# Patient Record
Sex: Female | Born: 1955 | Race: Black or African American | Hispanic: No | Smoking: Never smoker
Health system: Southern US, Community
[De-identification: ages and names within clinical notes are randomized; demographics above are authoritative.]

## PROBLEM LIST (undated history)

## (undated) DIAGNOSIS — I471 Supraventricular tachycardia, unspecified: Secondary | ICD-10-CM

## (undated) DIAGNOSIS — B009 Herpesviral infection, unspecified: Secondary | ICD-10-CM

## (undated) DIAGNOSIS — G7000 Myasthenia gravis without (acute) exacerbation: Secondary | ICD-10-CM

## (undated) DIAGNOSIS — I10 Essential (primary) hypertension: Secondary | ICD-10-CM

## (undated) DIAGNOSIS — E119 Type 2 diabetes mellitus without complications: Secondary | ICD-10-CM

## (undated) HISTORY — PX: UTERINE FIBROID SURGERY: SHX826

## (undated) HISTORY — DX: Essential (primary) hypertension: I10

## (undated) HISTORY — DX: Myasthenia gravis without (acute) exacerbation: G70.00

## (undated) HISTORY — PX: MYOMECTOMY: SHX85

## (undated) HISTORY — DX: Supraventricular tachycardia, unspecified: I47.10

## (undated) HISTORY — DX: Type 2 diabetes mellitus without complications: E11.9

## (undated) HISTORY — DX: Supraventricular tachycardia: I47.1

## (undated) HISTORY — DX: Herpesviral infection, unspecified: B00.9

---

## 2004-06-28 ENCOUNTER — Ambulatory Visit: Payer: Self-pay | Admitting: Internal Medicine

## 2004-07-03 ENCOUNTER — Ambulatory Visit: Payer: Self-pay | Admitting: General Practice

## 2005-05-26 ENCOUNTER — Ambulatory Visit: Payer: Self-pay | Admitting: Internal Medicine

## 2005-08-05 ENCOUNTER — Ambulatory Visit: Payer: Self-pay | Admitting: General Practice

## 2006-02-10 DIAGNOSIS — E119 Type 2 diabetes mellitus without complications: Secondary | ICD-10-CM

## 2006-02-10 HISTORY — DX: Type 2 diabetes mellitus without complications: E11.9

## 2006-09-01 ENCOUNTER — Ambulatory Visit: Payer: Self-pay | Admitting: Endocrinology

## 2006-12-17 ENCOUNTER — Ambulatory Visit: Payer: Self-pay | Admitting: Unknown Physician Specialty

## 2007-09-16 ENCOUNTER — Ambulatory Visit: Payer: Self-pay | Admitting: Internal Medicine

## 2008-01-17 ENCOUNTER — Emergency Department: Payer: Self-pay | Admitting: Emergency Medicine

## 2009-03-29 ENCOUNTER — Ambulatory Visit: Payer: Self-pay | Admitting: Internal Medicine

## 2011-12-11 ENCOUNTER — Ambulatory Visit: Payer: Self-pay | Admitting: Internal Medicine

## 2012-01-06 ENCOUNTER — Ambulatory Visit: Payer: Self-pay | Admitting: Internal Medicine

## 2012-02-24 ENCOUNTER — Ambulatory Visit: Payer: Self-pay | Admitting: Internal Medicine

## 2012-03-23 ENCOUNTER — Other Ambulatory Visit (HOSPITAL_COMMUNITY)
Admission: RE | Admit: 2012-03-23 | Discharge: 2012-03-23 | Disposition: A | Discharge status: Home / Self Care | Payer: BC Managed Care – PPO | Source: Ambulatory Visit | Attending: Internal Medicine | Admitting: Internal Medicine

## 2012-03-23 ENCOUNTER — Encounter: Payer: Self-pay | Admitting: Internal Medicine

## 2012-03-23 ENCOUNTER — Ambulatory Visit (INDEPENDENT_AMBULATORY_CARE_PROVIDER_SITE_OTHER): Payer: BC Managed Care – PPO | Admitting: Internal Medicine

## 2012-03-23 VITALS — BP 138/74 | HR 88 | Temp 98.4°F | Ht 68.17 in | Wt 277.5 lb

## 2012-03-23 DIAGNOSIS — Z1151 Encounter for screening for human papillomavirus (HPV): Secondary | ICD-10-CM | POA: Insufficient documentation

## 2012-03-23 DIAGNOSIS — Z124 Encounter for screening for malignant neoplasm of cervix: Secondary | ICD-10-CM

## 2012-03-23 DIAGNOSIS — I498 Other specified cardiac arrhythmias: Secondary | ICD-10-CM

## 2012-03-23 DIAGNOSIS — R000 Tachycardia, unspecified: Secondary | ICD-10-CM

## 2012-03-23 DIAGNOSIS — I10 Essential (primary) hypertension: Secondary | ICD-10-CM

## 2012-03-23 DIAGNOSIS — G7000 Myasthenia gravis without (acute) exacerbation: Secondary | ICD-10-CM

## 2012-03-23 DIAGNOSIS — Z01419 Encounter for gynecological examination (general) (routine) without abnormal findings: Secondary | ICD-10-CM | POA: Insufficient documentation

## 2012-03-23 DIAGNOSIS — I471 Supraventricular tachycardia: Secondary | ICD-10-CM

## 2012-03-23 DIAGNOSIS — E119 Type 2 diabetes mellitus without complications: Secondary | ICD-10-CM

## 2012-03-23 MED ORDER — METOPROLOL TARTRATE 25 MG PO TABS: 25.0000 mg | ORAL_TABLET | Freq: Two times a day (BID) | ORAL | Status: DC

## 2012-03-25 ENCOUNTER — Encounter: Payer: Self-pay | Admitting: Internal Medicine

## 2012-03-25 DIAGNOSIS — G7000 Myasthenia gravis without (acute) exacerbation: Secondary | ICD-10-CM | POA: Insufficient documentation

## 2012-03-25 DIAGNOSIS — I471 Supraventricular tachycardia: Secondary | ICD-10-CM | POA: Insufficient documentation

## 2012-03-25 DIAGNOSIS — I10 Essential (primary) hypertension: Secondary | ICD-10-CM | POA: Insufficient documentation

## 2012-03-25 DIAGNOSIS — E1165 Type 2 diabetes mellitus with hyperglycemia: Secondary | ICD-10-CM | POA: Insufficient documentation

## 2012-03-25 MED ORDER — ACYCLOVIR 200 MG PO CAPS: ORAL_CAPSULE | ORAL | Status: DC

## 2012-03-25 NOTE — Assessment & Plan Note (Signed)
Sugars as outlined.  Discussed need for diet adjustment and weight loss.  Check metabolic panel and a1c.

## 2012-03-25 NOTE — Assessment & Plan Note (Signed)
Currently stable.  Follow.   

## 2012-03-25 NOTE — Progress Notes (Signed)
Subjective:    Patient ID: Amber Decker, female    DOB: 04-Mar-1955, 57 y.o.   MRN: [id number]  HPI 57 year old female with past history of hypertension, diabetes, myasthenia gravis and SVT.  She comes in today to follow up on these issues as well for a complete physical exam.  She is off her metoprolol.  States she could not afford the medication.  She is off caffeine.  When coming off, noticed she had headaches and dizziness.  Her arms and legs ached.  The headache has improved.  Leg and arm aching has improved.  Has been having increased heart rate.  Intermittent.  Worse at night.  No chest pain.  Breathing stable.  Eating and drinking well.  Trying to watch what she eats.  Brought in no recorded sugar readings.  Am sugars averaging 120-130s and pm sugars 180-190.     Past Medical History  Diagnosis Date  . Hypertension   . Diabetes mellitus without complication   . Myasthenia gravis   . Herpes   . SVT (supraventricular tachycardia)     Outpatient Encounter Prescriptions as of 03/23/2012  Medication Sig Dispense Refill  . acyclovir (ZOVIRAX) 200 MG capsule Take by mouth 2 (two) times daily.      . Biotin 1000 MCG tablet Take 1,000 mcg by mouth daily.      . cholecalciferol (VITAMIN D) 1000 UNITS tablet Take 1,000 Units by mouth daily.      [name] lisinopril-hydrochlorothiazide (PRINZIDE,ZESTORETIC) 10-12.5 MG per tablet Take 1 tablet by mouth daily.      . metFORMIN (GLUCOPHAGE) 1000 MG tablet Take 1,000 mg by mouth 2 (two) times daily with a meal.      . Multiple Vitamin (MULTIVITAMIN) tablet Take 1 tablet by mouth daily.      . Multiple Vitamins-Minerals (ALIVE WOMENS 50+ PO) Take by mouth daily.      . ranitidine (ZANTAC) 150 MG tablet Take 150 mg by mouth as needed for heartburn.      . metoprolol tartrate (LOPRESSOR) 25 MG tablet Take 1 tablet (25 mg total) by mouth 2 (two) times daily.  60 tablet  2  . [DISCONTINUED] metoprolol succinate (TOPROL-XL) 25 MG 24 hr tablet Take 25 mg by  mouth 2 (two) times daily.       No facility-administered encounter medications on file as of 03/23/2012.    Review of Systems Previous headache.  This occurred when she stopped caffeine.  Better now.  No significant sinus or allergy symptoms.   Does report the increased heart rated as outlined.  Notices more at night.  Does occur during the day.  Appears to be occurring more frequently.  No increased shortness of breath, cough or congestion.  No nausea or vomiting.  No acid reflux.  No abdominal pain or cramping.  No bowel change, such as diarrhea, constipation, BRBPR or melana.  No urine change.  Sugars as outlined.       Objective:   Physical Exam Filed Vitals:   03/23/12 1140  BP: 138/74  Pulse: 88  Temp: 98.4 F (36.9 C)   Blood pressure recheck:  25/50  57 year old female in no acute distress.   HEENT:  Nares- clear.  Oropharynx - without lesions. NECK:  Supple.  Nontender.  No audible bruit.  HEART:  Appears to be regular. LUNGS:  No crackles or wheezing audible.  Respirations even and unlabored.  RADIAL PULSE:  Equal bilaterally.    BREASTS:  No nipple discharge or  nipple retraction present.  Could not appreciate any distinct nodules or axillary adenopathy.  ABDOMEN:  Soft, nontender.  Bowel sounds present and normal.  No audible abdominal bruit.  GU:  Normal external genitalia.  Vaginal vault without lesions.  Cervix identified.  Pap performed. Could not appreciate any adnexal masses or tenderness.   RECTAL:  Heme negative.   EXTREMITIES:  No increased edema present.  DP pulses palpable and equal bilaterally.           Assessment & Plan:  PULMONARY.  Breathing stable.  Follow.   HEALTH MAINTENANCE.  Physical today.  Pap today.  Schedule mammogram.  Colonoscopy 12/17/06 - internal hemorrhoids.  Recommended follow up colonoscopy 10 years.

## 2012-03-25 NOTE — Assessment & Plan Note (Signed)
Blood pressure as outlined.  Add back toprol for rate control.  Follow pressures.  Check metabolic panel.

## 2012-03-25 NOTE — Assessment & Plan Note (Signed)
Previous stress test revealed no ischemia.  She did have SVT.  Saw Dr Gwen Pounds.  He placed her on Toprol.  She is off toprol now.  See above.  Now with increasing heart rated and symptoms.  Unclear if all related to SVT.  Will restart her toprol.  Will use the short acting generic toprol - for cost savings.  EKG revealed SR with frequent PVCs.  Nonspecific ST/T changes.  I do feel she warrants further cardiac w/up.  Will refer back to cardiology for further evaluation and w/up.  Pt comfortable with this plan.  Explained if symptoms changed or worsened - she was to be reevaluated immediately.

## 2012-03-29 ENCOUNTER — Encounter: Payer: Self-pay | Admitting: *Deleted

## 2012-04-03 ENCOUNTER — Telehealth: Payer: Self-pay | Admitting: Internal Medicine

## 2012-04-03 MED ORDER — ACYCLOVIR 400 MG PO TABS: 400.0000 mg | ORAL_TABLET | Freq: Every day | ORAL | Status: DC

## 2012-04-03 NOTE — Telephone Encounter (Signed)
Order placed for acyclovir.

## 2012-04-07 ENCOUNTER — Telehealth: Payer: Self-pay | Admitting: Internal Medicine

## 2012-04-07 NOTE — Telephone Encounter (Signed)
[facility] calling regarding refill on acyclovir 400 mg #90.  [phone].

## 2012-04-09 NOTE — Telephone Encounter (Signed)
Called [place] [facility] back and changed quantity of to 90 w/1 refill.

## 2012-04-15 ENCOUNTER — Ambulatory Visit: Payer: Self-pay | Admitting: Internal Medicine

## 2012-04-23 ENCOUNTER — Encounter: Payer: Self-pay | Admitting: Internal Medicine

## 2012-05-06 ENCOUNTER — Ambulatory Visit (INDEPENDENT_AMBULATORY_CARE_PROVIDER_SITE_OTHER): Payer: BC Managed Care – PPO | Admitting: Internal Medicine

## 2012-05-06 ENCOUNTER — Encounter: Payer: Self-pay | Admitting: Internal Medicine

## 2012-05-06 VITALS — BP 120/80 | HR 108 | Temp 98.4°F | Ht 68.17 in | Wt 283.0 lb

## 2012-05-06 DIAGNOSIS — E119 Type 2 diabetes mellitus without complications: Secondary | ICD-10-CM

## 2012-05-06 DIAGNOSIS — I10 Essential (primary) hypertension: Secondary | ICD-10-CM

## 2012-05-06 DIAGNOSIS — G7000 Myasthenia gravis without (acute) exacerbation: Secondary | ICD-10-CM

## 2012-05-06 DIAGNOSIS — I471 Supraventricular tachycardia: Secondary | ICD-10-CM

## 2012-05-06 DIAGNOSIS — I498 Other specified cardiac arrhythmias: Secondary | ICD-10-CM

## 2012-05-06 MED ORDER — ACYCLOVIR 400 MG PO TABS: ORAL_TABLET | ORAL | Status: DC

## 2012-05-09 ENCOUNTER — Encounter: Payer: Self-pay | Admitting: Internal Medicine

## 2012-05-09 NOTE — Assessment & Plan Note (Signed)
Sugars as outlined.  Discussed need for diet adjustment and weight loss.  Check metabolic panel and a1c.  She plans to get her labs through a study and will forward her lab results to me.

## 2012-05-09 NOTE — Assessment & Plan Note (Signed)
Currently stable.  Follow.   

## 2012-05-09 NOTE — Assessment & Plan Note (Signed)
Blood pressure as outlined.  Back on metoprolol.  Follow.

## 2012-05-09 NOTE — Assessment & Plan Note (Signed)
Previous stress test revealed no ischemia.  She did have SVT.  Saw Dr Gwen Pounds.  He placed her on Toprol.  She was off last visit and symptoms were worse.  She is back on metoprolol now.  Symptoms improved.  Feels better. Seeing Dr Gwen Pounds.  Continue follow up with cardiology.

## 2012-05-09 NOTE — Progress Notes (Signed)
Subjective:    Patient ID: Amber Decker, female    DOB: 02-21-1955, 57 y.o.   MRN: [id number]  HPI 57 year old female with past history of hypertension, diabetes, myasthenia gravis and SVT.  She comes in today for a scheduled follow up.  She is back on metoprolol.  Heart racing and palpitaitons improved.  Feels better.  No chest pain.  Breathing stable.  Eating and drinking well.  Trying to watch what she eats.  Brought in no recorded sugar readings.  Am sugars averaging 106-119 and pm sugars 154-160.  Overall feels better.  Seeing Dr Gwen Pounds.  Planning to call for follow up.      Past Medical History  Diagnosis Date  . Hypertension   . Diabetes mellitus without complication   . Myasthenia gravis   . Herpes   . SVT (supraventricular tachycardia)     Outpatient Encounter Prescriptions as of 05/06/2012  Medication Sig Dispense Refill  . acyclovir (ZOVIRAX) 400 MG tablet One tablet q day to bid  180 tablet  1  . Biotin 1000 MCG tablet Take 1,000 mcg by mouth daily.      . cholecalciferol (VITAMIN D) 1000 UNITS tablet Take 1,000 Units by mouth daily.      [name] lisinopril-hydrochlorothiazide (PRINZIDE,ZESTORETIC) 10-12.5 MG per tablet Take 1 tablet by mouth daily.      . metFORMIN (GLUCOPHAGE) 1000 MG tablet Take 1,000 mg by mouth 2 (two) times daily with a meal.      . metoprolol tartrate (LOPRESSOR) 25 MG tablet Take 1 tablet (25 mg total) by mouth 2 (two) times daily.  60 tablet  2  . Multiple Vitamins-Minerals (ALIVE WOMENS 50+ PO) Take by mouth daily.      . Naphazoline-Polyethyl Glycol (EYE DROPS) 0.012-0.2 % SOLN Apply to eye.      . ranitidine (ZANTAC) 150 MG tablet Take 150 mg by mouth as needed for heartburn.      . [DISCONTINUED] acyclovir (ZOVIRAX) 400 MG tablet Take 1 tablet (400 mg total) by mouth daily.  90 tablet  1  . [DISCONTINUED] Multiple Vitamin (MULTIVITAMIN) tablet Take 1 tablet by mouth daily.       No facility-administered encounter medications on file as of  05/06/2012.    Review of Systems Patient denies any headache, lightheadedness or dizziness.  No sinus or allergy symptoms.  No chest pain, tightness or palpitations.  No increased shortness of breath, cough or congestion. No acid reflux.  Feels better on the metoprolol.  No nausea or vomiting.  No abdominal pain or cramping.  No bowel change, such as diarrhea, constipation, BRBPR or melana.  No urine change.  Seeing Dr Gwen Pounds.        Objective:   Physical Exam  Filed Vitals:   05/06/12 1153  BP: 120/80  Pulse: 108  Temp: 98.4 F (36.9 C)   Blood pressure recheck:  16/3  57 year old female in no acute distress.   HEENT:  Nares- clear.  Oropharynx - without lesions. NECK:  Supple.  Nontender.  No audible bruit.  HEART:  Appears to be regular with premature beats.   LUNGS:  No crackles or wheezing audible.  Respirations even and unlabored.  RADIAL PULSE:  Equal bilaterally.    ABDOMEN:  Soft, nontender.  Bowel sounds present and normal.  No audible abdominal bruit.    EXTREMITIES:  No increased edema present.          Assessment & Plan:  PULMONARY.  Breathing stable.  Follow.  HEALTH MAINTENANCE.  Physical last visit.  Scheduled for mammogram.  Colonoscopy 12/17/06 - internal hemorrhoids.  Recommended follow up colonoscopy 10 years.

## 2012-05-19 ENCOUNTER — Encounter: Payer: Self-pay | Admitting: Internal Medicine

## 2012-08-06 ENCOUNTER — Encounter: Payer: Self-pay | Admitting: Internal Medicine

## 2012-08-06 ENCOUNTER — Ambulatory Visit (INDEPENDENT_AMBULATORY_CARE_PROVIDER_SITE_OTHER): Payer: BC Managed Care – PPO | Admitting: Internal Medicine

## 2012-08-06 VITALS — BP 110/70 | HR 112 | Temp 98.3°F | Ht 68.17 in | Wt 288.2 lb

## 2012-08-06 DIAGNOSIS — G7000 Myasthenia gravis without (acute) exacerbation: Secondary | ICD-10-CM

## 2012-08-06 DIAGNOSIS — I471 Supraventricular tachycardia: Secondary | ICD-10-CM

## 2012-08-06 DIAGNOSIS — R000 Tachycardia, unspecified: Secondary | ICD-10-CM

## 2012-08-06 DIAGNOSIS — I10 Essential (primary) hypertension: Secondary | ICD-10-CM

## 2012-08-06 DIAGNOSIS — E119 Type 2 diabetes mellitus without complications: Secondary | ICD-10-CM

## 2012-08-06 DIAGNOSIS — I498 Other specified cardiac arrhythmias: Secondary | ICD-10-CM

## 2012-08-06 LAB — LIPID PANEL
Cholesterol: 193 mg/dL (ref 0–200)
HDL: 81.8 mg/dL (ref >39.00)
LDL Cholesterol: 101 mg/dL — ABNORMAL HIGH (ref 0–99)
Total CHOL/HDL Ratio: 2
Triglycerides: 52 mg/dL (ref 0.0–149.0)
VLDL: 10.4 mg/dL (ref 0.0–40.0)

## 2012-08-06 LAB — CBC WITH DIFFERENTIAL/PLATELET
Basophils Absolute: 0 10*3/uL (ref 0.0–0.1)
Basophils Relative: 0.5 % (ref 0.0–3.0)
Eosinophils Absolute: 0 10*3/uL (ref 0.0–0.7)
Eosinophils Relative: 0.7 % (ref 0.0–5.0)
HCT: 39.9 % (ref 36.0–46.0)
Hemoglobin: 13.3 g/dL (ref 12.0–15.0)
Lymphocytes Relative: 46.2 % — ABNORMAL HIGH (ref 12.0–46.0)
Lymphs Abs: 2.1 10*3/uL (ref 0.7–4.0)
MCHC: 33.3 g/dL (ref 30.0–36.0)
MCV: 91.5 fl (ref 78.0–100.0)
Monocytes Absolute: 0.4 10*3/uL (ref 0.1–1.0)
Monocytes Relative: 8.1 % (ref 3.0–12.0)
Neutro Abs: 2 10*3/uL (ref 1.4–7.7)
Neutrophils Relative %: 44.5 % (ref 43.0–77.0)
Platelets: 164 10*3/uL (ref 150.0–400.0)
RBC: 4.36 Mil/uL (ref 3.87–5.11)
RDW: 14.4 % (ref 11.5–14.6)
WBC: 4.6 10*3/uL (ref 4.5–10.5)

## 2012-08-06 LAB — COMPREHENSIVE METABOLIC PANEL
ALT: 19 U/L (ref 0–35)
AST: 21 U/L (ref 0–37)
Albumin: 4 g/dL (ref 3.5–5.2)
Alkaline Phosphatase: 44 U/L (ref 39–117)
BUN: 19 mg/dL (ref 6–23)
CO2: 27 mEq/L (ref 19–32)
Calcium: 9.5 mg/dL (ref 8.4–10.5)
Chloride: 106 mEq/L (ref 96–112)
Creatinine, Ser: 0.8 mg/dL (ref 0.4–1.2)
GFR: 95.4 mL/min (ref >60.00)
Glucose, Bld: 94 mg/dL (ref 70–99)
Potassium: 4.2 mEq/L (ref 3.5–5.1)
Sodium: 137 mEq/L (ref 135–145)
Total Bilirubin: 0.3 mg/dL (ref 0.3–1.2)
Total Protein: 7.4 g/dL (ref 6.0–8.3)

## 2012-08-06 LAB — HEMOGLOBIN A1C: Hgb A1c MFr Bld: 6.5 % (ref 4.6–6.5)

## 2012-08-06 LAB — TSH: TSH: 1.37 u[IU]/mL (ref 0.35–5.50)

## 2012-08-08 ENCOUNTER — Encounter: Payer: Self-pay | Admitting: Internal Medicine

## 2012-08-08 NOTE — Assessment & Plan Note (Signed)
Blood pressure as outlined.  Back on metoprolol.  Follow.

## 2012-08-08 NOTE — Assessment & Plan Note (Signed)
Previous stress test revealed no ischemia.  She did have SVT.  Saw Dr Gwen Pounds.  She is back on metoprolol now.  Symptoms improved.  Feels better. Seeing Dr Gwen Pounds.  Continue follow up with cardiology.

## 2012-08-08 NOTE — Assessment & Plan Note (Signed)
Sugars as outlined.  Discussed need for diet adjustment and weight loss.  Check metabolic panel and a1c.  Refer back to [facility] for two hour refresher class.

## 2012-08-08 NOTE — Progress Notes (Signed)
Subjective:    Patient ID: Amber Decker, female    DOB: 1955-05-13, 57 y.o.   MRN: [id number]  HPI 57 year old female with past history of hypertension, diabetes, myasthenia gravis and SVT.  She comes in today for a scheduled follow up.  She is back on metoprolol.  Heart racing and palpitaitons improved.  Feels better.  No chest pain.  Breathing stable.  Eating and drinking well.  Trying to watch what she eats.  Brought in no recorded sugar readings.  States am sugars averaging 95-126.  Overall feels better.  Seeing Dr Gwen Pounds.  Desires to go to a two hour refresher to help her with her diet adjustment.  Discussed the need for weight loss.      Past Medical History  Diagnosis Date  . Hypertension   . Diabetes mellitus without complication   . Myasthenia gravis   . Herpes   . SVT (supraventricular tachycardia)     Outpatient Encounter Prescriptions as of 08/06/2012  Medication Sig Dispense Refill  . acyclovir (ZOVIRAX) 400 MG tablet One tablet q day to bid  180 tablet  1  . Biotin 1000 MCG tablet Take 1,000 mcg by mouth daily.      . cholecalciferol (VITAMIN D) 1000 UNITS tablet Take 1,000 Units by mouth daily.      [name] lisinopril-hydrochlorothiazide (PRINZIDE,ZESTORETIC) 10-12.5 MG per tablet Take 1 tablet by mouth daily.      . metFORMIN (GLUCOPHAGE) 1000 MG tablet Take 1,000 mg by mouth 2 (two) times daily with a meal.      . metoprolol tartrate (LOPRESSOR) 25 MG tablet Take 1 tablet (25 mg total) by mouth 2 (two) times daily.  60 tablet  2  . Multiple Vitamins-Minerals (ALIVE WOMENS 50+ PO) Take by mouth daily.      . Naphazoline-Polyethyl Glycol (EYE DROPS) 0.012-0.2 % SOLN Apply to eye.      . ranitidine (ZANTAC) 150 MG tablet Take 150 mg by mouth as needed for heartburn.       No facility-administered encounter medications on file as of 08/06/2012.    Review of Systems Patient denies any headache, lightheadedness or dizziness.  No sinus or allergy symptoms.  No chest pain,  tightness or palpitations.  No increased shortness of breath, cough or congestion. No acid reflux.  Feels better on the metoprolol.  No nausea or vomiting.  No abdominal pain or cramping.  No bowel change, such as diarrhea, constipation, BRBPR or melana.  No urine change.  Seeing Dr Gwen Pounds.        Objective:   Physical Exam  Filed Vitals:   08/06/12 1159  BP: 110/70  Pulse: 112  Temp: 98.3 F (36.8 C)   Pulse check: 32  57 year old female in no acute distress.   HEENT:  Nares- clear.  Oropharynx - without lesions. NECK:  Supple.  Nontender.  No audible bruit.  HEART:  Appears to be regular with premature beats.   LUNGS:  No crackles or wheezing audible.  Respirations even and unlabored.  RADIAL PULSE:  Equal bilaterally.    ABDOMEN:  Soft, nontender.  Bowel sounds present and normal.  No audible abdominal bruit.    EXTREMITIES:  No increased edema present.          Assessment & Plan:  PULMONARY.  Breathing stable.  Follow.   HEALTH MAINTENANCE.  Physical 03/23/12.  Scheduled for mammogram.  Missed.  Needs to reschedule.  Colonoscopy 12/17/06 - internal hemorrhoids.  Recommended follow up colonoscopy  10 years.

## 2012-08-08 NOTE — Assessment & Plan Note (Signed)
Currently stable.  Follow.   

## 2012-08-09 ENCOUNTER — Encounter: Payer: Self-pay | Admitting: *Deleted

## 2012-08-25 ENCOUNTER — Other Ambulatory Visit: Payer: Self-pay | Admitting: *Deleted

## 2012-08-25 MED ORDER — LISINOPRIL-HYDROCHLOROTHIAZIDE 10-12.5 MG PO TABS: 1.0000 | ORAL_TABLET | Freq: Every day | ORAL | Status: DC

## 2012-09-09 ENCOUNTER — Encounter: Payer: Self-pay | Admitting: Emergency Medicine

## 2012-09-16 ENCOUNTER — Other Ambulatory Visit: Payer: Self-pay | Admitting: *Deleted

## 2012-09-16 MED ORDER — LISINOPRIL-HYDROCHLOROTHIAZIDE 10-12.5 MG PO TABS: 1.0000 | ORAL_TABLET | Freq: Every day | ORAL | Status: DC

## 2012-09-29 ENCOUNTER — Encounter: Payer: Self-pay | Admitting: *Deleted

## 2012-11-01 ENCOUNTER — Other Ambulatory Visit: Payer: Self-pay | Admitting: *Deleted

## 2012-11-01 MED ORDER — METFORMIN HCL 1000 MG PO TABS: 1000.0000 mg | ORAL_TABLET | Freq: Two times a day (BID) | ORAL | Status: DC

## 2012-11-10 ENCOUNTER — Ambulatory Visit (INDEPENDENT_AMBULATORY_CARE_PROVIDER_SITE_OTHER): Payer: BC Managed Care – PPO | Admitting: Internal Medicine

## 2012-11-10 ENCOUNTER — Encounter: Payer: Self-pay | Admitting: Internal Medicine

## 2012-11-10 VITALS — BP 120/80 | HR 103 | Temp 97.9°F | Ht 68.17 in | Wt 288.5 lb

## 2012-11-10 DIAGNOSIS — Z23 Encounter for immunization: Secondary | ICD-10-CM

## 2012-11-10 DIAGNOSIS — E119 Type 2 diabetes mellitus without complications: Secondary | ICD-10-CM

## 2012-11-10 DIAGNOSIS — I471 Supraventricular tachycardia: Secondary | ICD-10-CM

## 2012-11-10 DIAGNOSIS — G7000 Myasthenia gravis without (acute) exacerbation: Secondary | ICD-10-CM

## 2012-11-10 DIAGNOSIS — I498 Other specified cardiac arrhythmias: Secondary | ICD-10-CM

## 2012-11-10 DIAGNOSIS — I10 Essential (primary) hypertension: Secondary | ICD-10-CM

## 2012-11-11 ENCOUNTER — Encounter: Payer: Self-pay | Admitting: *Deleted

## 2012-11-11 LAB — MICROALBUMIN / CREATININE URINE RATIO
Creatinine,U: 116.1 mg/dL
Microalb Creat Ratio: 0.3 mg/g (ref 0.0–30.0)
Microalb, Ur: 0.3 mg/dL (ref 0.0–1.9)

## 2012-11-12 ENCOUNTER — Encounter: Payer: Self-pay | Admitting: Internal Medicine

## 2012-11-12 NOTE — Assessment & Plan Note (Signed)
Blood pressure as outlined.  Back on metoprolol.  Follow.

## 2012-11-12 NOTE — Assessment & Plan Note (Signed)
Sugars as outlined.  Discussed need for diet adjustment and weight loss.  Follow met b and a1c.  Needs to stay up to date with eye exams.     

## 2012-11-12 NOTE — Assessment & Plan Note (Signed)
Currently stable.  Follow.   

## 2012-11-12 NOTE — Assessment & Plan Note (Signed)
Previous stress test revealed no ischemia.  She did have SVT.  Saw Dr Gwen Pounds.  She is back on metoprolol now.  Symptoms improved.  Feels better. Seeing Dr Gwen Pounds.  Continue follow up with cardiology.   Still having intermittent palpitations.  Some (what sounds like) premature beats on exam.  Discussed with her regarding repeat EKG and further evaluation.  She declines further testing at this time.  Follow.

## 2012-11-12 NOTE — Progress Notes (Signed)
Subjective:    Patient ID: Amber Decker, female    DOB: 06-16-1955, 57 y.o.   MRN: [id number]  HPI 57 year old female with past history of hypertension, diabetes, myasthenia gravis and SVT.  She comes in today for a scheduled follow up.  She is back on metoprolol.  Heart racing and palpitaitons improved.  Feels better.  No chest pain.  Breathing stable.  Eating and drinking well.  Trying to watch what she eats.  Brought in no recorded sugar readings.  States am sugars averaging 120s.  PM sugars averaging 160-180.  Overall feels better.  Seeing Dr Gwen Pounds.  Had desired to go to a two hour refresher to help her with her diet adjustment.  They have contacted her.  She has not followed through with making the appt.  Discussed the importance of scheduling.  Discussed the need for weight loss.      Past Medical History  Diagnosis Date  . Hypertension   . Diabetes mellitus without complication   . Myasthenia gravis   . Herpes   . SVT (supraventricular tachycardia)     Outpatient Encounter Prescriptions as of 11/10/2012  Medication Sig Dispense Refill  . acyclovir (ZOVIRAX) 400 MG tablet One tablet q day to bid  180 tablet  1  . Biotin 1000 MCG tablet Take 1,000 mcg by mouth daily.      . cholecalciferol (VITAMIN D) 1000 UNITS tablet Take 1,000 Units by mouth daily.      [name] lisinopril-hydrochlorothiazide (PRINZIDE,ZESTORETIC) 10-12.5 MG per tablet Take 1 tablet by mouth daily.  90 tablet  1  . metFORMIN (GLUCOPHAGE) 1000 MG tablet Take 1 tablet (1,000 mg total) by mouth 2 (two) times daily with a meal.  60 tablet  5  . metoprolol tartrate (LOPRESSOR) 25 MG tablet Take 1 tablet (25 mg total) by mouth 2 (two) times daily.  60 tablet  2  . Multiple Vitamins-Minerals (ALIVE WOMENS 50+ PO) Take by mouth daily.      . Naphazoline-Polyethyl Glycol (EYE DROPS) 0.012-0.2 % SOLN Apply to eye.      . ranitidine (ZANTAC) 150 MG tablet Take 150 mg by mouth as needed for heartburn.       No  facility-administered encounter medications on file as of 11/10/2012.    Review of Systems Patient denies any headache, lightheadedness or dizziness.  No sinus or allergy symptoms.  No chest pain, tightness or significant palpitations.  States does notice some occasional palpitations.  Overall feels better.  No increased shortness of breath, cough or congestion. No acid reflux.  Feels better on the metoprolol.  No nausea or vomiting.  No abdominal pain or cramping.  No bowel change, such as diarrhea, constipation, BRBPR or melana.  No urine change.  Seeing Dr Gwen Pounds.  Increased stress.  Mother recently passed away.  Feels she is coping relatively well.        Objective:   Physical Exam  Filed Vitals:   11/10/12 1129  BP: 120/80  Pulse: 103  Temp: 97.9 F (36.6 C)   Blood pressure recheck:  31/75  57 year old female in no acute distress.   HEENT:  Nares- clear.  Oropharynx - without lesions. NECK:  Supple.  Nontender.  No audible bruit.  HEART:  Appears to be regular with premature beats.   LUNGS:  No crackles or wheezing audible.  Respirations even and unlabored.  RADIAL PULSE:  Equal bilaterally.    ABDOMEN:  Soft, nontender.  Bowel sounds present and normal.  No audible abdominal bruit.    EXTREMITIES:  No increased edema present.          Assessment & Plan:  PULMONARY.  Breathing stable.  Follow.   HEALTH MAINTENANCE.  Physical 03/23/12.  Scheduled for mammogram.  Missed.  Needs to reschedule.  Colonoscopy 12/17/06 - internal hemorrhoids.  Recommended follow up colonoscopy 10 years.

## 2013-02-13 ENCOUNTER — Emergency Department: Payer: Self-pay | Admitting: Internal Medicine

## 2013-02-13 LAB — BASIC METABOLIC PANEL
Anion Gap: 4 — ABNORMAL LOW (ref 7–16)
BUN: 18 mg/dL (ref 7–18)
Calcium, Total: 9.1 mg/dL (ref 8.5–10.1)
Chloride: 105 mmol/L (ref 98–107)
Co2: 28 mmol/L (ref 21–32)
Creatinine: 0.84 mg/dL (ref 0.60–1.30)
EGFR (African American): >60
EGFR (Non-African Amer.): >60
Glucose: 199 mg/dL — ABNORMAL HIGH (ref 65–99)
Osmolality: 281 (ref 275–301)
Potassium: 4.2 mmol/L (ref 3.5–5.1)
Sodium: 137 mmol/L (ref 136–145)

## 2013-02-13 LAB — CBC
HCT: 41.5 % (ref 35.0–47.0)
HGB: 14.1 g/dL (ref 12.0–16.0)
MCH: 30 pg (ref 26.0–34.0)
MCHC: 33.9 g/dL (ref 32.0–36.0)
MCV: 89 fL (ref 80–100)
Platelet: 179 10*3/uL (ref 150–440)
RBC: 4.69 10*6/uL (ref 3.80–5.20)
RDW: 13.6 % (ref 11.5–14.5)
WBC: 4.6 10*3/uL (ref 3.6–11.0)

## 2013-02-13 LAB — TROPONIN I: Troponin-I: <0.02 ng/mL

## 2013-02-15 ENCOUNTER — Other Ambulatory Visit: Payer: Self-pay | Admitting: Internal Medicine

## 2013-02-16 ENCOUNTER — Other Ambulatory Visit: Payer: Self-pay | Admitting: Internal Medicine

## 2013-02-16 ENCOUNTER — Encounter: Payer: Self-pay | Admitting: Internal Medicine

## 2013-02-16 ENCOUNTER — Ambulatory Visit (INDEPENDENT_AMBULATORY_CARE_PROVIDER_SITE_OTHER): Payer: BC Managed Care – PPO | Admitting: Internal Medicine

## 2013-02-16 ENCOUNTER — Encounter (INDEPENDENT_AMBULATORY_CARE_PROVIDER_SITE_OTHER): Payer: Self-pay

## 2013-02-16 VITALS — BP 130/80 | HR 82 | Temp 98.2°F | Ht 68.17 in | Wt 288.2 lb

## 2013-02-16 DIAGNOSIS — I498 Other specified cardiac arrhythmias: Secondary | ICD-10-CM

## 2013-02-16 DIAGNOSIS — I10 Essential (primary) hypertension: Secondary | ICD-10-CM

## 2013-02-16 DIAGNOSIS — E119 Type 2 diabetes mellitus without complications: Secondary | ICD-10-CM

## 2013-02-16 DIAGNOSIS — G7000 Myasthenia gravis without (acute) exacerbation: Secondary | ICD-10-CM

## 2013-02-16 DIAGNOSIS — I471 Supraventricular tachycardia: Secondary | ICD-10-CM

## 2013-02-16 DIAGNOSIS — K219 Gastro-esophageal reflux disease without esophagitis: Secondary | ICD-10-CM

## 2013-02-16 MED ORDER — METFORMIN HCL 1000 MG PO TABS: 1000.0000 mg | ORAL_TABLET | Freq: Two times a day (BID) | ORAL | Status: DC

## 2013-02-16 MED ORDER — METOPROLOL TARTRATE 25 MG PO TABS: 25.0000 mg | ORAL_TABLET | Freq: Two times a day (BID) | ORAL | Status: DC

## 2013-02-16 NOTE — Progress Notes (Signed)
Subjective:    Patient ID: Amber Decker, female    DOB: 10/25/1955, 58 y.o.   MRN: [id number]  HPI 58 year old female with past history of hypertension, diabetes, myasthenia gravis and SVT.  She comes in today for a scheduled follow up.  She is back on metoprolol.  Heart racing and palpitaitons improved.  Feels better.  No chest pain.  Breathing stable.  Eating and drinking well.  Trying to watch what she eats.  Brought in no recorded sugar readings.  States sugars ok.  Seeing Dr Gwen PoundsKowalski.  Was evaluated recently in the ER for shooting pains.  States felt pain in her back, neck and shoulder.  Had CXR, EKG, US and labs.  States checked out fine.  Some increased belching.  Has started taking her ranitidine.  This has helped.  They had instructed her to take prilosec.  She has not started this yet.  Saw cardiology just after this ER visit.  Had stress test.  States everything checked out fine.  Had been taking some ibuprofen starting a few weeks ago.  No pain now.  Does feel better.  Eating.  No bowel change reported.       Past Medical History  Diagnosis Date  . Hypertension   . Diabetes mellitus without complication   . Myasthenia gravis   . Herpes   . SVT (supraventricular tachycardia)     Outpatient Encounter Prescriptions as of 02/16/2013  Medication Sig  . acyclovir (ZOVIRAX) 400 MG tablet TAKE 1 TABLET BY MOUTH EVERY DAY  . Biotin 1000 MCG tablet Take 1,000 mcg by mouth daily.  . cholecalciferol (VITAMIN D) 1000 UNITS tablet Take 1,000 Units by mouth daily.  [name]. lisinopril-hydrochlorothiazide (PRINZIDE,ZESTORETIC) 10-12.5 MG per tablet Take 1 tablet by mouth daily.  . metFORMIN (GLUCOPHAGE) 1000 MG tablet Take 1 tablet (1,000 mg total) by mouth 2 (two) times daily with a meal.  . metoprolol tartrate (LOPRESSOR) 25 MG tablet Take 1 tablet (25 mg total) by mouth 2 (two) times daily.  . Multiple Vitamins-Minerals (ALIVE WOMENS 50+ PO) Take by mouth daily.  . Naphazoline-Polyethyl Glycol (EYE  DROPS) 0.012-0.2 % SOLN Apply to eye.  . ranitidine (ZANTAC) 150 MG tablet Take 150 mg by mouth as needed for heartburn.  . [DISCONTINUED] metFORMIN (GLUCOPHAGE) 1000 MG tablet Take 1 tablet (1,000 mg total) by mouth 2 (two) times daily with a meal.  . [DISCONTINUED] metoprolol tartrate (LOPRESSOR) 25 MG tablet Take 1 tablet (25 mg total) by mouth 2 (two) times daily.    Review of Systems Patient denies any headache, lightheadedness or dizziness.  No sinus or allergy symptoms.  No chest pain now.  No tightness or significant palpitations.  States does notice some occasional palpitations.  Overall feels better.  No increased shortness of breath, cough or congestion.  Question of acid reflux.  Increased belching.   No nausea or vomiting.  No abdominal pain or cramping.  No bowel change, such as diarrhea, constipation, BRBPR or melana.  No urine change.  Seeing Dr Gwen PoundsKowalski.  Just had stress test.         Objective:   Physical Exam  Filed Vitals:   02/16/13 1145  BP: 130/80  Pulse: 82  Temp: 98.2 F (36.8 C)   Blood pressure recheck:  18122/5878  58 year old female in no acute distress.   HEENT:  Nares- clear.  Oropharynx - without lesions. NECK:  Supple.  Nontender.  No audible bruit.  HEART:  Appears to be regular  with premature beats.   LUNGS:  No crackles or wheezing audible.  Respirations even and unlabored.  RADIAL PULSE:  Equal bilaterally.    ABDOMEN:  Soft, nontender.  Bowel sounds present and normal.  No audible abdominal bruit.    EXTREMITIES:  No increased edema present.          Assessment & Plan:  PULMONARY.  Breathing stable.  Follow.   HEALTH MAINTENANCE.  Physical 03/23/12.  Scheduled for mammogram.  Missed.  Needs to reschedule.  Discussed again with her today.  Colonoscopy 12/17/06 - internal hemorrhoids.  Recommended follow up colonoscopy 10 years.

## 2013-02-16 NOTE — Progress Notes (Signed)
Pre-visit discussion using our clinic review tool. No additional management support is needed unless otherwise documented below in the visit note.  

## 2013-02-16 NOTE — Progress Notes (Signed)
Order placed for f/u lab.   

## 2013-02-20 ENCOUNTER — Encounter: Payer: Self-pay | Admitting: Internal Medicine

## 2013-02-20 DIAGNOSIS — K219 Gastro-esophageal reflux disease without esophagitis: Secondary | ICD-10-CM | POA: Insufficient documentation

## 2013-02-20 NOTE — Assessment & Plan Note (Signed)
Currently stable.  Follow.   

## 2013-02-20 NOTE — Assessment & Plan Note (Signed)
Recent stress test revealed no ischemia.  She did have SVT.  Saw Dr Kowalski.  On metoprolol.   Symptoms improved.  Feels better.  Continue follow up with cardiology.   Overall feels from a cardiac standpoint things are stable/better.    

## 2013-02-20 NOTE — Assessment & Plan Note (Addendum)
Concern that her pain and increased belching related to an underlying GI origin.  Recent stress test negative.  Obtain records.  On ranitidine.  Has helped.  Will start prilosec as instructed.  Follow.  Get her back in soon to reassess.  May need GI referral.  Had abdominal ultrasound - negative for gallstones.  Does not appear to directly correlate to after eating.  Follow.  Stop the ibuprofen.

## 2013-02-20 NOTE — Assessment & Plan Note (Signed)
Sugars as outlined.  Discussed need for diet adjustment and weight loss.  Follow met b and a1c.  Needs to stay up to date with eye exams.

## 2013-02-20 NOTE — Assessment & Plan Note (Signed)
Blood pressure as outlined.  Back on metoprolol.  Follow.  Doing well.    

## 2013-03-15 ENCOUNTER — Other Ambulatory Visit: Payer: Self-pay | Admitting: Internal Medicine

## 2013-04-13 ENCOUNTER — Ambulatory Visit (INDEPENDENT_AMBULATORY_CARE_PROVIDER_SITE_OTHER): Payer: BC Managed Care – PPO | Admitting: Internal Medicine

## 2013-04-13 ENCOUNTER — Encounter: Payer: Self-pay | Admitting: Internal Medicine

## 2013-04-13 VITALS — BP 120/80 | HR 92 | Temp 98.4°F | Ht 68.17 in | Wt 286.8 lb

## 2013-04-13 DIAGNOSIS — I498 Other specified cardiac arrhythmias: Secondary | ICD-10-CM

## 2013-04-13 DIAGNOSIS — E119 Type 2 diabetes mellitus without complications: Secondary | ICD-10-CM

## 2013-04-13 DIAGNOSIS — I10 Essential (primary) hypertension: Secondary | ICD-10-CM

## 2013-04-13 DIAGNOSIS — I471 Supraventricular tachycardia: Secondary | ICD-10-CM

## 2013-04-13 DIAGNOSIS — G7000 Myasthenia gravis without (acute) exacerbation: Secondary | ICD-10-CM

## 2013-04-13 DIAGNOSIS — K219 Gastro-esophageal reflux disease without esophagitis: Secondary | ICD-10-CM

## 2013-04-13 LAB — COMPREHENSIVE METABOLIC PANEL
ALT: 12 U/L (ref 0–35)
AST: 17 U/L (ref 0–37)
Albumin: 4 g/dL (ref 3.5–5.2)
Alkaline Phosphatase: 46 U/L (ref 39–117)
BUN: 16 mg/dL (ref 6–23)
CO2: 27 mEq/L (ref 19–32)
Calcium: 9.4 mg/dL (ref 8.4–10.5)
Chloride: 106 mEq/L (ref 96–112)
Creatinine, Ser: 1 mg/dL (ref 0.4–1.2)
GFR: 78.05 mL/min (ref >60.00)
Glucose, Bld: 78 mg/dL (ref 70–99)
Potassium: 3.9 mEq/L (ref 3.5–5.1)
Sodium: 139 mEq/L (ref 135–145)
Total Bilirubin: 0.6 mg/dL (ref 0.3–1.2)
Total Protein: 7.5 g/dL (ref 6.0–8.3)

## 2013-04-13 LAB — HEMOGLOBIN A1C: Hgb A1c MFr Bld: 7 % — ABNORMAL HIGH (ref 4.6–6.5)

## 2013-04-13 NOTE — Progress Notes (Signed)
Pre-visit discussion using our clinic review tool. No additional management support is needed unless otherwise documented below in the visit note.  

## 2013-04-13 NOTE — Patient Instructions (Signed)
Lotrimin cream for feet (can use twice a day).  eucerin cream for legs.

## 2013-04-14 ENCOUNTER — Encounter: Payer: Self-pay | Admitting: *Deleted

## 2013-04-17 ENCOUNTER — Encounter: Payer: Self-pay | Admitting: Internal Medicine

## 2013-04-17 NOTE — Progress Notes (Signed)
Subjective:    Patient ID: Amber Decker, female    DOB: 1955-09-17, 58 y.o.   MRN: [id number]  HPI 58 year old female with past history of hypertension, diabetes, myasthenia gravis and SVT.  She comes in today for a scheduled follow up.  She is on metoprolol.  Heart racing and palpitaitons improved.  Feels better.  No chest pain.  Breathing stable.  Eating and drinking well.  Trying to watch what she eats.  Brought in no recorded sugar readings.  States sugars averaging 96-110.  Seeing Amber Decker.  Was evaluated recently in the ER for shooting pains.  States felt pain in her back, neck and shoulder.  Had CXR, EKG, [place] and labs.  See last note for details.    Saw cardiology just after this ER visit.  Had stress test.  States everything checked out fine.  Started on prilosec last visit.  Feels better.  Eating.  No bowel change reported.   Does still report some increased stress.  Feels she is handling things relatively well.  Does not feel she needs any further intervention.      Past Medical History  Diagnosis Date  . Hypertension   . Diabetes mellitus without complication   . Myasthenia gravis   . Herpes   . SVT (supraventricular tachycardia)     Outpatient Encounter Prescriptions as of 04/13/2013  Medication Sig  . acyclovir (ZOVIRAX) 400 MG tablet TAKE 1 TABLET BY MOUTH EVERY DAY  . Biotin 1000 MCG tablet Take 1,000 mcg by mouth daily.  . cholecalciferol (VITAMIN D) 1000 UNITS tablet Take 1,000 Units by mouth daily.  [name] lisinopril-hydrochlorothiazide (PRINZIDE,ZESTORETIC) 10-12.5 MG per tablet TAKE ONE TABLET BY MOUTH ONCE DAILY  . metFORMIN (GLUCOPHAGE) 1000 MG tablet Take 1 tablet (1,000 mg total) by mouth 2 (two) times daily with a meal.  . metoprolol tartrate (LOPRESSOR) 25 MG tablet Take 1 tablet (25 mg total) by mouth 2 (two) times daily.  . Multiple Vitamins-Minerals (ALIVE WOMENS 50+ PO) Take by mouth daily.  . Naphazoline-Polyethyl Glycol (EYE DROPS) 0.012-0.2 % SOLN Apply to eye.   . ranitidine (ZANTAC) 150 MG tablet Take 150 mg by mouth as needed for heartburn.    Review of Systems Patient denies any headache, lightheadedness or dizziness.  No sinus or allergy symptoms.  No chest pain now.  No tightness or significant palpitations.  States does notice some occasional palpitations.  Overall feels better.  No increased shortness of breath, cough or congestion.  No stomach pain or belching.  Doing better on prilosec.   No nausea or vomiting.  No abdominal pain or cramping.  No bowel change, such as diarrhea, constipation, BRBPR or melana.  No urine change.  Seeing Amber Decker.  Just had stress test.  Negative.  Some increased stress. See above.         Objective:   Physical Exam  Filed Vitals:   04/13/13 1144  BP: 120/80  Pulse: 92  Temp: 98.4 F (36.9 C)   Blood pressure recheck:  50/65  58 year old female in no acute distress.   HEENT:  Nares- clear.  Oropharynx - without lesions. NECK:  Supple.  Nontender.  No audible bruit.  HEART:  Appears to be regular with premature beats.   LUNGS:  No crackles or wheezing audible.  Respirations even and unlabored.  RADIAL PULSE:  Equal bilaterally.    ABDOMEN:  Soft, nontender.  Bowel sounds present and normal.  No audible abdominal bruit.  EXTREMITIES:  No increased edema present.  FEET:  No skin lesions.           Assessment & Plan:  PULMONARY.  Breathing stable.  Follow.   DERMATOLOGY.  Dry skin.  Can try eucerin cream.  Can apply lotrimin to feet.  Keep fee dry.    HEALTH MAINTENANCE.  Physical 03/23/12.  Scheduled for mammogram.  Missed.  Needs to reschedule.  Discussed again with her today.  Colonoscopy 12/17/06 - internal hemorrhoids.  Recommended follow up colonoscopy 10 years.

## 2013-04-17 NOTE — Assessment & Plan Note (Signed)
Recent stress test revealed no ischemia.  She did have SVT.  Saw Dr Kowalski.  On metoprolol.   Symptoms improved.  Feels better.  Continue follow up with cardiology.   Overall feels from a cardiac standpoint things are stable/better.    

## 2013-04-17 NOTE — Assessment & Plan Note (Signed)
Sugars as outlined.  Discussed need for diet adjustment and weight loss.  Follow met b and a1c.  Needs to stay up to date with eye exams.  Number given to call [employer] for a refresher class or to meet with a nutritionist.

## 2013-04-17 NOTE — Assessment & Plan Note (Signed)
Currently stable.  Follow.   

## 2013-04-17 NOTE — Assessment & Plan Note (Signed)
Blood pressure as outlined.  Back on metoprolol.  Follow.  Doing well.    

## 2013-04-17 NOTE — Assessment & Plan Note (Signed)
Recently evaluated in the ER.  See last note for details.  Had abdominal ultrasound - negative for gallstones.  Had negative cardiac w/up.  Was started on prilosec last visit.  Doing better.  Symptoms resolved.  Follow.

## 2013-05-03 ENCOUNTER — Ambulatory Visit: Payer: BC Managed Care – PPO | Admitting: Internal Medicine

## 2013-05-03 ENCOUNTER — Other Ambulatory Visit: Payer: Self-pay | Admitting: Internal Medicine

## 2013-05-10 ENCOUNTER — Other Ambulatory Visit: Payer: Self-pay | Admitting: Internal Medicine

## 2013-05-24 ENCOUNTER — Encounter: Payer: Self-pay | Admitting: Internal Medicine

## 2013-05-30 ENCOUNTER — Encounter: Payer: Self-pay | Admitting: Internal Medicine

## 2013-05-30 ENCOUNTER — Ambulatory Visit: Payer: Self-pay | Admitting: Internal Medicine

## 2013-05-30 ENCOUNTER — Ambulatory Visit (INDEPENDENT_AMBULATORY_CARE_PROVIDER_SITE_OTHER): Payer: BC Managed Care – PPO | Admitting: Internal Medicine

## 2013-05-30 VITALS — BP 130/80 | HR 73 | Temp 97.9°F | Ht 68.17 in | Wt 290.5 lb

## 2013-05-30 DIAGNOSIS — I10 Essential (primary) hypertension: Secondary | ICD-10-CM

## 2013-05-30 DIAGNOSIS — E119 Type 2 diabetes mellitus without complications: Secondary | ICD-10-CM

## 2013-05-30 DIAGNOSIS — K219 Gastro-esophageal reflux disease without esophagitis: Secondary | ICD-10-CM

## 2013-05-30 DIAGNOSIS — I471 Supraventricular tachycardia, unspecified: Secondary | ICD-10-CM

## 2013-05-30 DIAGNOSIS — G7000 Myasthenia gravis without (acute) exacerbation: Secondary | ICD-10-CM

## 2013-05-30 DIAGNOSIS — M25519 Pain in unspecified shoulder: Secondary | ICD-10-CM

## 2013-05-30 DIAGNOSIS — I498 Other specified cardiac arrhythmias: Secondary | ICD-10-CM

## 2013-05-30 NOTE — Progress Notes (Signed)
Subjective:    Patient ID: Amber Decker, female    DOB: 07/02/1955, 58 y.o.   MRN: [id number]  HPI 58 year old female with past history of hypertension, diabetes, myasthenia gravis and SVT.  She comes in today for a scheduled follow up.  She is on metoprolol.  Heart racing and palpitaitons improved.  Feels better.  No chest pain.  Breathing stable.  Eating and drinking well.  Trying to watch what she eats.  Brought in recorded sugar readings.  Sugars averaging 102-120's in the am and 160-180s in the pm.  Seeing Dr Gwen PoundsKowalski.  Heart stable.  On prilosec.  Feels better.  Eating.  No bowel change reported.   Does still report some increased stress.  Feels she is handling things relatively well.  Does not feel she needs any further intervention.      Past Medical History  Diagnosis Date  . Hypertension   . Diabetes mellitus without complication   . Myasthenia gravis   . Herpes   . SVT (supraventricular tachycardia)     Outpatient Encounter Prescriptions as of 05/30/2013  Medication Sig  . acyclovir (ZOVIRAX) 200 MG capsule TAKE TWO CAPSULES BY MOUTH ONCE DAILY  . acyclovir (ZOVIRAX) 400 MG tablet TAKE 1 TABLET BY MOUTH EVERY DAY  . Biotin 1000 MCG tablet Take 1,000 mcg by mouth daily.  . cholecalciferol (VITAMIN D) 1000 UNITS tablet Take 1,000 Units by mouth daily.  [name]. lisinopril-hydrochlorothiazide (PRINZIDE,ZESTORETIC) 10-12.5 MG per tablet TAKE ONE TABLET BY MOUTH ONCE DAILY  . metFORMIN (GLUCOPHAGE) 1000 MG tablet Take 1 tablet (1,000 mg total) by mouth 2 (two) times daily with a meal.  . metoprolol tartrate (LOPRESSOR) 25 MG tablet Take 1 tablet (25 mg total) by mouth 2 (two) times daily.  . Multiple Vitamins-Minerals (ALIVE WOMENS 50+ PO) Take by mouth daily.  . Naphazoline-Polyethyl Glycol (EYE DROPS) 0.012-0.2 % SOLN Apply to eye.  . ranitidine (ZANTAC) 150 MG tablet Take 150 mg by mouth as needed for heartburn.    Review of Systems Patient denies any headache, lightheadedness or  dizziness.  No sinus or allergy symptoms.  No chest pain.  No tightness or significant palpitations.  Overall feels better.  No increased shortness of breath, cough or congestion.  No stomach pain or belching.  Doing better on prilosec.   No nausea or vomiting.  No abdominal pain or cramping.  No bowel change, such as diarrhea, constipation, BRBPR or melana.  No urine change.  Seeing Dr Gwen PoundsKowalski.  Had stress test.  Negative.  Some increased stress. See above.  Sugars as outlined.  She does report some pain in her right shoulder and in her neck (right).  No known injury or trauma.          Objective:   Physical Exam  Filed Vitals:   05/30/13 1110  BP: 130/80  Pulse: 73  Temp: 97.9 F (3136.626 C)   58 year old female in no acute distress.   HEENT:  Nares- clear.  Oropharynx - without lesions. NECK:  Supple.  Nontender.  No audible bruit.  HEART:  Appears to be regular with premature beats.   LUNGS:  No crackles or wheezing audible.  Respirations even and unlabored.  RADIAL PULSE:  Equal bilaterally.    ABDOMEN:  Soft, nontender.  Bowel sounds present and normal.  No audible abdominal bruit.    EXTREMITIES:  No increased edema present.  FEET:  No skin lesions.    MSK:  Some increased pain in her  neck with rotation of her head.  Some increased discomfort in her right shoulder.         Assessment & Plan:  PULMONARY.  Breathing stable.  Follow.    HEALTH MAINTENANCE.  Physical 03/23/12.  Scheduled for mammogram.  Missed.  Needs to reschedule.  Discussed again with her today.  Colonoscopy 12/17/06 - internal hemorrhoids.  Recommended follow up colonoscopy 10 years.

## 2013-05-30 NOTE — Progress Notes (Signed)
Pre visit review using our clinic review tool, if applicable. No additional management support is needed unless otherwise documented below in the visit note. 

## 2013-05-31 ENCOUNTER — Telehealth: Payer: Self-pay | Admitting: Internal Medicine

## 2013-05-31 DIAGNOSIS — M542 Cervicalgia: Secondary | ICD-10-CM

## 2013-05-31 DIAGNOSIS — M25511 Pain in right shoulder: Secondary | ICD-10-CM

## 2013-05-31 NOTE — Telephone Encounter (Signed)
Order placed for physical therapy referral.   

## 2013-06-01 ENCOUNTER — Encounter: Payer: Self-pay | Admitting: Internal Medicine

## 2013-06-01 DIAGNOSIS — M25519 Pain in unspecified shoulder: Secondary | ICD-10-CM | POA: Insufficient documentation

## 2013-06-01 NOTE — Assessment & Plan Note (Signed)
Recent stress test revealed no ischemia.  She did have SVT.  Saw Dr Gwen PoundsKowalski.  On metoprolol.   Symptoms improved.  Feels better.  Continue follow up with cardiology.   Overall feels from a cardiac standpoint things are stable/better.

## 2013-06-01 NOTE — Assessment & Plan Note (Signed)
Blood pressure as outlined.  Back on metoprolol.  Follow.  Doing well.

## 2013-06-01 NOTE — Assessment & Plan Note (Signed)
Right shoulder pain and neck pain as outlined.  Persistent.  Check C-Spine xray and right shoulder xray.  Further w/up pending results.

## 2013-06-01 NOTE — Assessment & Plan Note (Signed)
Currently stable.  Follow.   

## 2013-06-01 NOTE — Assessment & Plan Note (Signed)
Recently evaluated in the ER.  See previous notes for details.  Had abdominal ultrasound - negative for gallstones.  Had negative cardiac w/up.  Was started on prilosec.  Doing better.  Symptoms resolved.  Follow.

## 2013-06-01 NOTE — Assessment & Plan Note (Signed)
Sugars as outlined.  Discussed need for diet adjustment and weight loss.  Follow met b and a1c.  Needs to stay up to date with eye exams.  Number has been given to call [employer] for a refresher class or to meet with a nutritionist.   Discussed additional medication.  She declines.  Wants to work on diet and exercise.   Follow.

## 2013-06-07 ENCOUNTER — Other Ambulatory Visit: Payer: Self-pay | Admitting: Internal Medicine

## 2013-06-21 ENCOUNTER — Encounter: Payer: Self-pay | Admitting: Internal Medicine

## 2013-06-28 ENCOUNTER — Encounter: Payer: Self-pay | Admitting: Internal Medicine

## 2013-07-14 ENCOUNTER — Encounter: Payer: Self-pay | Admitting: Internal Medicine

## 2013-07-26 ENCOUNTER — Encounter: Payer: BC Managed Care – PPO | Admitting: Internal Medicine

## 2013-08-15 ENCOUNTER — Other Ambulatory Visit (INDEPENDENT_AMBULATORY_CARE_PROVIDER_SITE_OTHER): Payer: BC Managed Care – PPO

## 2013-08-15 DIAGNOSIS — I10 Essential (primary) hypertension: Secondary | ICD-10-CM

## 2013-08-15 DIAGNOSIS — E119 Type 2 diabetes mellitus without complications: Secondary | ICD-10-CM

## 2013-08-15 DIAGNOSIS — I471 Supraventricular tachycardia: Secondary | ICD-10-CM

## 2013-08-15 DIAGNOSIS — I498 Other specified cardiac arrhythmias: Secondary | ICD-10-CM

## 2013-08-15 DIAGNOSIS — G7000 Myasthenia gravis without (acute) exacerbation: Secondary | ICD-10-CM

## 2013-08-15 LAB — TSH: TSH: 1.18 u[IU]/mL (ref 0.35–4.50)

## 2013-08-15 LAB — CBC WITH DIFFERENTIAL/PLATELET
Basophils Absolute: 0 10*3/uL (ref 0.0–0.1)
Basophils Relative: 0.5 % (ref 0.0–3.0)
Eosinophils Absolute: 0.1 10*3/uL (ref 0.0–0.7)
Eosinophils Relative: 1.6 % (ref 0.0–5.0)
HCT: 38.6 % (ref 36.0–46.0)
Hemoglobin: 13.1 g/dL (ref 12.0–15.0)
Lymphocytes Relative: 44.3 % (ref 12.0–46.0)
Lymphs Abs: 2.1 10*3/uL (ref 0.7–4.0)
MCHC: 34.1 g/dL (ref 30.0–36.0)
MCV: 89.3 fl (ref 78.0–100.0)
Monocytes Absolute: 0.4 10*3/uL (ref 0.1–1.0)
Monocytes Relative: 7.9 % (ref 3.0–12.0)
Neutro Abs: 2.2 10*3/uL (ref 1.4–7.7)
Neutrophils Relative %: 45.7 % (ref 43.0–77.0)
Platelets: 176 10*3/uL (ref 150.0–400.0)
RBC: 4.32 Mil/uL (ref 3.87–5.11)
RDW: 13.5 % (ref 11.5–15.5)
WBC: 4.9 10*3/uL (ref 4.0–10.5)

## 2013-08-15 LAB — COMPREHENSIVE METABOLIC PANEL
ALT: 14 U/L (ref 0–35)
AST: 19 U/L (ref 0–37)
Albumin: 3.9 g/dL (ref 3.5–5.2)
Alkaline Phosphatase: 43 U/L (ref 39–117)
BUN: 15 mg/dL (ref 6–23)
CO2: 25 mEq/L (ref 19–32)
Calcium: 9.3 mg/dL (ref 8.4–10.5)
Chloride: 109 mEq/L (ref 96–112)
Creatinine, Ser: 0.8 mg/dL (ref 0.4–1.2)
GFR: 92.38 mL/min (ref >60.00)
Glucose, Bld: 129 mg/dL — ABNORMAL HIGH (ref 70–99)
Potassium: 4.3 mEq/L (ref 3.5–5.1)
Sodium: 141 mEq/L (ref 135–145)
Total Bilirubin: 0.4 mg/dL (ref 0.2–1.2)
Total Protein: 7.1 g/dL (ref 6.0–8.3)

## 2013-08-15 LAB — LIPID PANEL
Cholesterol: 182 mg/dL (ref 0–200)
HDL: 77.7 mg/dL (ref >39.00)
LDL Cholesterol: 93 mg/dL (ref 0–99)
NonHDL: 104.3
Total CHOL/HDL Ratio: 2
Triglycerides: 57 mg/dL (ref 0.0–149.0)
VLDL: 11.4 mg/dL (ref 0.0–40.0)

## 2013-08-15 LAB — HEMOGLOBIN A1C: Hgb A1c MFr Bld: 6.7 % — ABNORMAL HIGH (ref 4.6–6.5)

## 2013-08-16 ENCOUNTER — Encounter: Payer: Self-pay | Admitting: *Deleted

## 2013-08-18 ENCOUNTER — Encounter: Payer: Self-pay | Admitting: Internal Medicine

## 2013-08-18 ENCOUNTER — Ambulatory Visit (INDEPENDENT_AMBULATORY_CARE_PROVIDER_SITE_OTHER): Payer: BC Managed Care – PPO | Admitting: Internal Medicine

## 2013-08-18 VITALS — BP 120/80 | HR 85 | Temp 98.0°F | Ht 69.25 in | Wt 287.2 lb

## 2013-08-18 DIAGNOSIS — I498 Other specified cardiac arrhythmias: Secondary | ICD-10-CM

## 2013-08-18 DIAGNOSIS — I471 Supraventricular tachycardia: Secondary | ICD-10-CM

## 2013-08-18 DIAGNOSIS — E119 Type 2 diabetes mellitus without complications: Secondary | ICD-10-CM

## 2013-08-18 DIAGNOSIS — G7000 Myasthenia gravis without (acute) exacerbation: Secondary | ICD-10-CM

## 2013-08-18 DIAGNOSIS — K219 Gastro-esophageal reflux disease without esophagitis: Secondary | ICD-10-CM

## 2013-08-18 DIAGNOSIS — I10 Essential (primary) hypertension: Secondary | ICD-10-CM

## 2013-08-18 NOTE — Progress Notes (Signed)
Pre visit review using our clinic review tool, if applicable. No additional management support is needed unless otherwise documented below in the visit note. 

## 2013-08-22 ENCOUNTER — Encounter: Payer: Self-pay | Admitting: Internal Medicine

## 2013-08-22 NOTE — Assessment & Plan Note (Addendum)
Doing better.  Follow.   

## 2013-08-22 NOTE — Assessment & Plan Note (Signed)
Currently stable.  Follow.   

## 2013-08-22 NOTE — Assessment & Plan Note (Signed)
Sugars as outlined.  We again discussed diet adjustment and weight loss.  She is walking now.  Lost some weight.  Sugars better.  Follow met b and a1c.  Needs to stay up to date with eye exams.  Number has been given to call [employer] for a refresher class or to meet with a nutritionist.   Follow.

## 2013-08-22 NOTE — Progress Notes (Signed)
Subjective:    Patient ID: Amber Decker, female    DOB: 08/21/1955, 58 y.o.   MRN: [id number]  HPI 58 year old female with past history of hypertension, diabetes, myasthenia gravis and SVT.  She comes in today to follow up on these issues as well as for a complete physical exam.   She is on metoprolol.  Heart racing and palpitaitons improved.  Feels better.  No chest pain.  Breathing stable.  Eating and drinking well.  Trying to watch what she eats.  Doing better with this.  She is walking.   Brought in no recorded sugar readings.  States sugars averaging 109-118 in the am and 160-180 in the pm.  A1c improved.  Seeing Dr Gwen PoundsKowalski.  Heart stable.  On prilosec.  Feels better.  Eating.  No bowel change reported.   Does still report some increased stress.  Feels she is handling things relatively well.  Does not feel she needs any further intervention.  Feels better and doing better.      Past Medical History  Diagnosis Date  . Hypertension   . Diabetes mellitus without complication   . Myasthenia gravis   . Herpes   . SVT (supraventricular tachycardia)     Outpatient Encounter Prescriptions as of 08/18/2013  Medication Sig  . acyclovir (ZOVIRAX) 200 MG capsule TAKE TWO CAPSULES BY MOUTH ONCE DAILY  . acyclovir (ZOVIRAX) 400 MG tablet TAKE 1 TABLET BY MOUTH EVERY DAY  . Biotin 1000 MCG tablet Take 1,000 mcg by mouth daily.  . cholecalciferol (VITAMIN D) 1000 UNITS tablet Take 1,000 Units by mouth daily.  [name]. lisinopril-hydrochlorothiazide (PRINZIDE,ZESTORETIC) 10-12.5 MG per tablet TAKE ONE TABLET BY MOUTH ONCE DAILY  . metFORMIN (GLUCOPHAGE) 1000 MG tablet Take 1 tablet (1,000 mg total) by mouth 2 (two) times daily with a meal.  . metoprolol tartrate (LOPRESSOR) 25 MG tablet Take 1 tablet (25 mg total) by mouth 2 (two) times daily.  . Multiple Vitamins-Minerals (ALIVE WOMENS 50+ PO) Take by mouth daily.  . Naphazoline-Polyethyl Glycol (EYE DROPS) 0.012-0.2 % SOLN Apply to eye.  . ranitidine  (ZANTAC) 150 MG tablet Take 150 mg by mouth as needed for heartburn.    Review of Systems Patient denies any headache, lightheadedness or dizziness.  No sinus or allergy symptoms.  No chest pain.  No tightness or significant palpitations.  Overall feels better.  No increased shortness of breath, cough or congestion.  No stomach pain or belching.  Doing better on prilosec.   No nausea or vomiting.  No abdominal pain or cramping.  No bowel change, such as diarrhea, constipation, BRBPR or melana.  No urine change.  Seeing Dr Gwen PoundsKowalski.  Had stress test.  Negative.  Some increased stress. See above.  Sugars as outlined.  She does report some pain in her right shoulder and in her neck (right).  No known injury or trauma.         Objective:   Physical Exam  Filed Vitals:   08/18/13 1331  BP: 120/80  Pulse: 85  Temp: 98 F (36.7 C)   Blood pressure recheck:  62128/4078  58 year old female in no acute distress.   HEENT:  Nares- clear.  Oropharynx - without lesions. NECK:  Supple.  Nontender.  No audible bruit.  HEART:  Appears to be regular. LUNGS:  No crackles or wheezing audible.  Respirations even and unlabored.  RADIAL PULSE:  Equal bilaterally.    BREASTS:  No nipple discharge or nipple retraction  present.  Could not appreciate any distinct nodules or axillary adenopathy.  ABDOMEN:  Soft, nontender.  Bowel sounds present and normal.  No audible abdominal bruit.  GU:  Not performed.   EXTREMITIES:  No increased edema present.  DP pulses palpable and equal bilaterally.      FEET:  No lesions.       Assessment & Plan:  PULMONARY.  Breathing stable.  Follow.    HEALTH MAINTENANCE.  Physical today.  PAP 03/23/12 negative with negative HPV.  Scheduled for mammogram.  Missed.  Still has not scheduled.  Discussed again with her today.  We will schedule.  Colonoscopy 12/17/06 - internal hemorrhoids.  Recommended follow up colonoscopy 10 years.

## 2013-08-22 NOTE — Assessment & Plan Note (Signed)
Blood pressure as outlined.  Back on metoprolol.  Follow.  Doing well.    

## 2013-08-22 NOTE — Assessment & Plan Note (Signed)
Recent stress test revealed no ischemia.  She did have SVT.  Saw Dr Kowalski.  On metoprolol.   Symptoms improved.  Feels better.  Continue follow up with cardiology.   Overall feels from a cardiac standpoint things are stable/better.    

## 2013-09-17 ENCOUNTER — Other Ambulatory Visit: Payer: Self-pay | Admitting: Internal Medicine

## 2013-12-05 ENCOUNTER — Telehealth: Payer: Self-pay

## 2013-12-05 NOTE — Telephone Encounter (Signed)
Spoke with pt, she is going to call pharmacy to have them send refill requests.

## 2013-12-05 NOTE — Telephone Encounter (Signed)
The patient called has a yearly follow up in November.  However, she states "all" of her medications have ran out.   Is able to get refills until her apt in Nov?

## 2013-12-07 ENCOUNTER — Other Ambulatory Visit: Payer: Self-pay | Admitting: Internal Medicine

## 2013-12-20 ENCOUNTER — Other Ambulatory Visit (INDEPENDENT_AMBULATORY_CARE_PROVIDER_SITE_OTHER): Payer: BC Managed Care – PPO

## 2013-12-20 DIAGNOSIS — I10 Essential (primary) hypertension: Secondary | ICD-10-CM

## 2013-12-20 DIAGNOSIS — E119 Type 2 diabetes mellitus without complications: Secondary | ICD-10-CM

## 2013-12-20 LAB — COMPREHENSIVE METABOLIC PANEL
ALT: 15 U/L (ref 0–35)
AST: 18 U/L (ref 0–37)
Albumin: 3.6 g/dL (ref 3.5–5.2)
Alkaline Phosphatase: 49 U/L (ref 39–117)
BUN: 15 mg/dL (ref 6–23)
CO2: 23 mEq/L (ref 19–32)
Calcium: 9.4 mg/dL (ref 8.4–10.5)
Chloride: 106 mEq/L (ref 96–112)
Creatinine, Ser: 0.9 mg/dL (ref 0.4–1.2)
GFR: 82.87 mL/min (ref >60.00)
Glucose, Bld: 150 mg/dL — ABNORMAL HIGH (ref 70–99)
Potassium: 4.3 mEq/L (ref 3.5–5.1)
Sodium: 140 mEq/L (ref 135–145)
Total Bilirubin: 0.6 mg/dL (ref 0.2–1.2)
Total Protein: 7.3 g/dL (ref 6.0–8.3)

## 2013-12-20 LAB — MICROALBUMIN / CREATININE URINE RATIO
Creatinine,U: 232.5 mg/dL
Microalb Creat Ratio: 0.3 mg/g (ref 0.0–30.0)
Microalb, Ur: 0.6 mg/dL (ref 0.0–1.9)

## 2013-12-20 LAB — LIPID PANEL
Cholesterol: 196 mg/dL (ref 0–200)
HDL: 68.7 mg/dL (ref >39.00)
LDL Cholesterol: 112 mg/dL — ABNORMAL HIGH (ref 0–99)
NonHDL: 127.3
Total CHOL/HDL Ratio: 3
Triglycerides: 76 mg/dL (ref 0.0–149.0)
VLDL: 15.2 mg/dL (ref 0.0–40.0)

## 2013-12-20 LAB — HEMOGLOBIN A1C: Hgb A1c MFr Bld: 6.9 % — ABNORMAL HIGH (ref 4.6–6.5)

## 2013-12-22 ENCOUNTER — Encounter: Payer: Self-pay | Admitting: Internal Medicine

## 2013-12-22 ENCOUNTER — Ambulatory Visit (INDEPENDENT_AMBULATORY_CARE_PROVIDER_SITE_OTHER): Payer: BC Managed Care – PPO | Admitting: *Deleted

## 2013-12-22 ENCOUNTER — Ambulatory Visit (INDEPENDENT_AMBULATORY_CARE_PROVIDER_SITE_OTHER): Payer: BC Managed Care – PPO | Admitting: Internal Medicine

## 2013-12-22 DIAGNOSIS — Z23 Encounter for immunization: Secondary | ICD-10-CM

## 2013-12-22 DIAGNOSIS — I471 Supraventricular tachycardia: Secondary | ICD-10-CM

## 2013-12-22 DIAGNOSIS — K219 Gastro-esophageal reflux disease without esophagitis: Secondary | ICD-10-CM

## 2013-12-22 DIAGNOSIS — G7000 Myasthenia gravis without (acute) exacerbation: Secondary | ICD-10-CM

## 2013-12-22 DIAGNOSIS — E119 Type 2 diabetes mellitus without complications: Secondary | ICD-10-CM

## 2013-12-22 DIAGNOSIS — I10 Essential (primary) hypertension: Secondary | ICD-10-CM

## 2013-12-22 NOTE — Progress Notes (Signed)
Pre visit review using our clinic review tool, if applicable. No additional management support is needed unless otherwise documented below in the visit note. 

## 2014-01-01 ENCOUNTER — Encounter: Payer: Self-pay | Admitting: Internal Medicine

## 2014-01-01 DIAGNOSIS — E669 Obesity, unspecified: Secondary | ICD-10-CM | POA: Insufficient documentation

## 2014-01-01 NOTE — Progress Notes (Signed)
Subjective:    Patient ID: Amber Decker, female    DOB: 02/17/1955, [age] y.o.   MRN: [id number]  HPI 58 year old female with past history of hypertension, diabetes, myasthenia gravis and SVT.  She comes in today for a scheduled follow up.   She is on metoprolol.  Heart racing and palpitaitons improved.  Feels better.  No chest pain.  Breathing stable.  Eating and drinking well.  Trying to watch what she eats. Doing better with this.  She has been exercising.  Plans to do more.   Brought in no recorded sugar readings.  States sugars averaging [id number] in the am.  Not checking in the pm.   A1c improved.  Seeing Dr Gwen PoundsKowalski.  Heart stable.  On prilosec.  Feels better.  Eating.  No bowel change reported.   Does still report some increased stress.  Feels she is handling things well.  Does not feel she needs any further intervention.  Feels better and doing better.      Past Medical History  Diagnosis Date  . Hypertension   . Diabetes mellitus without complication   . Myasthenia gravis   . Herpes   . SVT (supraventricular tachycardia)     Outpatient Encounter Prescriptions as of 12/22/2013  Medication Sig  . acyclovir (ZOVIRAX) 200 MG capsule TAKE TWO CAPSULES BY MOUTH ONCE DAILY  . acyclovir (ZOVIRAX) 400 MG tablet TAKE 1 TABLET BY MOUTH EVERY DAY  . Biotin 1000 MCG tablet Take 1,000 mcg by mouth daily.  . cholecalciferol (VITAMIN D) 1000 UNITS tablet Take 1,000 Units by mouth daily.  [name]. lisinopril-hydrochlorothiazide (PRINZIDE,ZESTORETIC) 10-12.5 MG per tablet TAKE ONE TABLET BY MOUTH ONCE DAILY  . metFORMIN (GLUCOPHAGE) 1000 MG tablet TAKE ONE TABLET BY MOUTH TWICE DAILY WITH A MEAL  . metoprolol tartrate (LOPRESSOR) 25 MG tablet TAKE ONE TABLET BY MOUTH TWICE DAILY  . Multiple Vitamins-Minerals (ALIVE WOMENS 50+ PO) Take by mouth daily.  . Naphazoline-Polyethyl Glycol (EYE DROPS) 0.012-0.2 % SOLN Apply to eye.  . ranitidine (ZANTAC) 150 MG tablet Take 150 mg by mouth as needed for  heartburn.    Review of Systems Patient denies any headache, lightheadedness or dizziness.  No sinus or allergy symptoms.  No chest pain.  No tightness or significant palpitations.  Overall feels better.  No increased shortness of breath, cough or congestion.  No stomach pain.  Doing better on prilosec.   No nausea or vomiting.  No abdominal pain or cramping.  No bowel change, such as diarrhea, constipation, BRBPR or melana.  No urine change.  Seeing Dr Gwen PoundsKowalski.  Had stress test.  Negative.  Some increased stress. See above.  Sugars as outlined.        Objective:   Physical Exam  Filed Vitals:   12/22/13 1433  BP: 133/84  Pulse: 64  Temp: 98 F (5136.807 C)   58 year old female in no acute distress.   HEENT:  Nares- clear.  Oropharynx - without lesions. NECK:  Supple.  Nontender.  No audible bruit.  HEART:  Appears to be regular. LUNGS:  No crackles or wheezing audible.  Respirations even and unlabored.  RADIAL PULSE:  Equal bilaterally.   ABDOMEN:  Soft, nontender.  Bowel sounds present and normal.  No audible abdominal bruit.   EXTREMITIES:  No increased edema present.  DP pulses palpable and equal bilaterally.      FEET:  No lesions.       Assessment & Plan:  1. Severe obesity (  BMI >= 40) Discussed diet and weight loss.  She is exercising and plans to do more.  Follow.   2. SVT (supraventricular tachycardia) On metoprolol.  Better.    3. Essential hypertension, benign Blood pressure doing well.  Follow.  Follow metabolic panel.    4. Gastroesophageal reflux disease, esophagitis presence not specified Controlled on zantac.    5. Type 2 diabetes mellitus without complication Sugars as outlined.  Continue metformin.  Follow.   Lab Results  Component Value Date   HGBA1C 6.9* 12/20/2013   6. Myasthenia gravis Stable.    PULMONARY.  Breathing stable.  Follow.    HEALTH MAINTENANCE.  Physical 08/18/13.  PAP 03/23/12 negative with negative HPV.  Scheduled for mammogram.   Missed.  Still has not scheduled.  Have discussed with her.  Colonoscopy 12/17/06 - internal hemorrhoids.  Recommended follow up colonoscopy 10 years.

## 2014-02-25 ENCOUNTER — Other Ambulatory Visit: Payer: Self-pay | Admitting: Internal Medicine

## 2014-03-03 ENCOUNTER — Other Ambulatory Visit: Payer: Self-pay | Admitting: Internal Medicine

## 2014-03-06 ENCOUNTER — Other Ambulatory Visit: Payer: Self-pay | Admitting: Internal Medicine

## 2014-04-14 ENCOUNTER — Telehealth: Payer: Self-pay | Admitting: Internal Medicine

## 2014-04-14 NOTE — Telephone Encounter (Signed)
[facility] [facility] [place] TELEPHONE ADVICE RECORD [facility] Call [facility]  Patient Name: Amber Decker  Gender: Female  DOB: 1956/01/30   Age: 59 Y 8 M 27 D  Return Phone Number: [phone] (Primary)  Address: [address]   City/State/Zip: [place] [address]   Client [facility] [place]  Client Site  [facility]  Physician Lorin Picket, [place]   Contact Type Call  Call Type Triage / Clinical     Relationship To Patient Neighbor  Appointment Disposition EMR Appointment Attempted - Not Scheduled  Return Phone Number [phone] (Primary)  Chief Complaint Nasal Congestion  Initial Comment Caller states has gone from sneezing out mucus to coughing it up, and now has headache and stuffiness; Type 2 diabetic;      PreDisposition Home Care     Info pasted into Epic Yes   Nurse Assessment  Nurse: Deloria Lair, RN, Trish Date/Time (Eastern Time): 04/14/2014 11:42:43 AM  Confirm and document reason for call. If symptomatic, describe symptoms. ---Patient is calling for self and caller states has gone from sneezing out mucus to coughing it up, and now has headache and stuffiness; Type 2 diabetic; Has congestion and frequent cough. She woke up with a H/A  Has the patient traveled out of the country within the last 30 days? ---No  Does the patient require triage? ---Yes  Related visit to physician within the last 2 weeks? ---No  Does the PT have any chronic conditions? (i.e. diabetes, asthma, etc.) ---Yes  List chronic conditions. ---SVT (supraventricular tachycardia) -8 mo Dale [place], MD Essential hypertension, benign -8 mo Dale [place], MD Digestive GERD (gastroesophageal reflux disease) -8 mo Dale [place], MD Endocrine Diabetes -8 mo Dale [place], MD Musculoskeletal and Integument Myasthenia gravis -8 mo Dale [place], MD Other Shoulder pain -10 mo Dale , MD Severe obesity  (BMI >= 40)     Guidelines      Guideline Title Affirmed Question Affirmed Notes Nurse Date/Time Lamount Cohen Time)  Common Cold [1] Sinus congestion (pressure, fullness) AND [2] present > 10 days  [place], RN, Trish 04/14/2014 11:45:24 AM   Disp. Time Lamount Cohen Time) Disposition Final User          04/14/2014 11:50:36 AM See PCP When Office is Open (within 3 days) Yes Deloria Lair, RN, Rosann Auerbach        Caller Understands: No  Disagree/Comply: Comply     Care Advice Given Per Guideline    * STEP 1: Lean over a sink. * STEP 2: Gently squirt or spray warm salt water into one of your nostrils. * STEP 3: Some of the water may run into the back of your throat. Spit this out. If you swallow the salt water it will not hurt you. * STEP 4: Blow your nose to clean out the water and mucus. * STEP 5: Repeat steps 1-4 for the other nostril. You can do this a couple times a day if it seems to help you. SEE PCP WITHIN 3 DAYS: You need to be examined within 2 or 3 days. Call your doctor during regular office hours and make an appointment. (Note: if office will be open tomorrow, tell caller to call then, not in 3 days). * Do not take these medications if you have high blood pressure, heart disease, prostate enlargement, or an overactive thyroid. HUMIDIFIER: If the air in your home is dry, use a humidifier. CALL BACK IF: * Fever over  104 F * You have difficulty breathing * You become worse. CARE ADVICE given per Colds (Adult) guideline. * Sore throat: throat lozenges, hard candy or warm chicken broth.     After Care Instructions Given

## 2014-04-14 NOTE — Telephone Encounter (Signed)
It appears that the call a nurse gave her instructions to do at home.  Is she needing something more.  If persistent symptoms, will need to be seen.

## 2014-04-20 ENCOUNTER — Other Ambulatory Visit: Payer: Self-pay

## 2014-04-20 ENCOUNTER — Other Ambulatory Visit (INDEPENDENT_AMBULATORY_CARE_PROVIDER_SITE_OTHER): Payer: BLUE CROSS/BLUE SHIELD

## 2014-04-20 DIAGNOSIS — I10 Essential (primary) hypertension: Secondary | ICD-10-CM

## 2014-04-20 DIAGNOSIS — E119 Type 2 diabetes mellitus without complications: Secondary | ICD-10-CM

## 2014-04-20 LAB — LIPID PANEL
Cholesterol: 167 mg/dL (ref 0–200)
HDL: 56.9 mg/dL (ref >39.00)
LDL Cholesterol: 97 mg/dL (ref 0–99)
NonHDL: 110.1
Total CHOL/HDL Ratio: 3
Triglycerides: 66 mg/dL (ref 0.0–149.0)
VLDL: 13.2 mg/dL (ref 0.0–40.0)

## 2014-04-20 LAB — COMPREHENSIVE METABOLIC PANEL
ALT: 10 U/L (ref 0–35)
AST: 15 U/L (ref 0–37)
Albumin: 4.1 g/dL (ref 3.5–5.2)
Alkaline Phosphatase: 56 U/L (ref 39–117)
BUN: 22 mg/dL (ref 6–23)
CO2: 29 mEq/L (ref 19–32)
Calcium: 9.8 mg/dL (ref 8.4–10.5)
Chloride: 105 mEq/L (ref 96–112)
Creatinine, Ser: 0.84 mg/dL (ref 0.40–1.20)
GFR: 89.63 mL/min (ref >60.00)
Glucose, Bld: 132 mg/dL — ABNORMAL HIGH (ref 70–99)
Potassium: 4.1 mEq/L (ref 3.5–5.1)
Sodium: 138 mEq/L (ref 135–145)
Total Bilirubin: 0.3 mg/dL (ref 0.2–1.2)
Total Protein: 7.9 g/dL (ref 6.0–8.3)

## 2014-04-20 LAB — HEMOGLOBIN A1C: Hgb A1c MFr Bld: 6.8 % — ABNORMAL HIGH (ref 4.6–6.5)

## 2014-04-24 ENCOUNTER — Other Ambulatory Visit: Payer: Self-pay | Admitting: Internal Medicine

## 2014-04-25 ENCOUNTER — Ambulatory Visit (INDEPENDENT_AMBULATORY_CARE_PROVIDER_SITE_OTHER): Payer: BLUE CROSS/BLUE SHIELD | Admitting: Internal Medicine

## 2014-04-25 ENCOUNTER — Encounter: Payer: Self-pay | Admitting: Internal Medicine

## 2014-04-25 VITALS — BP 120/70 | HR 74 | Temp 97.8°F | Ht 69.25 in | Wt 272.5 lb

## 2014-04-25 DIAGNOSIS — G7000 Myasthenia gravis without (acute) exacerbation: Secondary | ICD-10-CM

## 2014-04-25 DIAGNOSIS — E119 Type 2 diabetes mellitus without complications: Secondary | ICD-10-CM

## 2014-04-25 DIAGNOSIS — E669 Obesity, unspecified: Secondary | ICD-10-CM

## 2014-04-25 DIAGNOSIS — I471 Supraventricular tachycardia: Secondary | ICD-10-CM

## 2014-04-25 DIAGNOSIS — I10 Essential (primary) hypertension: Secondary | ICD-10-CM

## 2014-04-25 DIAGNOSIS — K219 Gastro-esophageal reflux disease without esophagitis: Secondary | ICD-10-CM

## 2014-04-25 MED ORDER — ACYCLOVIR 200 MG PO CAPS: ORAL_CAPSULE | ORAL | Status: DC

## 2014-04-25 NOTE — Progress Notes (Signed)
Pre visit review using our clinic review tool, if applicable. No additional management support is needed unless otherwise documented below in the visit note. 

## 2014-04-25 NOTE — Progress Notes (Signed)
Patient ID: Amber Decker, female   DOB: 19-Jan-1956, 59 y.o.   MRN: [id number]   Subjective:    Patient ID: Amber Decker, female    DOB: 09-Aug-1955, 59 y.o.   MRN: [id number]  HPI  Patient here for a scheduled follow up.   States she is doing well.  Has adjusted her diet.  Lost weight.  Feels better.  No chest pain or tightness.  Breathing stable.  Bowels stable.     Past Medical History  Diagnosis Date  . Hypertension   . Diabetes mellitus without complication   . Myasthenia gravis   . Herpes   . SVT (supraventricular tachycardia)     Current Outpatient Prescriptions on File Prior to Visit  Medication Sig Dispense Refill  . Biotin 1000 MCG tablet Take 1,000 mcg by mouth daily.    . cholecalciferol (VITAMIN D) 1000 UNITS tablet Take 1,000 Units by mouth daily.    [name] lisinopril-hydrochlorothiazide (PRINZIDE,ZESTORETIC) 10-12.5 MG per tablet TAKE ONE TABLET BY MOUTH ONCE DAILY 90 tablet 0  . metFORMIN (GLUCOPHAGE) 1000 MG tablet TAKE ONE TABLET BY MOUTH TWICE DAILY WITH A MEAL 180 tablet 1  . metoprolol tartrate (LOPRESSOR) 25 MG tablet TAKE ONE TABLET BY MOUTH TWICE DAILY 180 tablet 1  . Multiple Vitamins-Minerals (ALIVE WOMENS 50+ PO) Take by mouth daily.    . Naphazoline-Polyethyl Glycol (EYE DROPS) 0.012-0.2 % SOLN Apply to eye.    . ranitidine (ZANTAC) 150 MG tablet Take 150 mg by mouth as needed for heartburn.     No current facility-administered medications on file prior to visit.    Review of Systems  Constitutional: Negative for appetite change and unexpected weight change (has adjusted her diet and lost weigth.  ).  HENT: Negative for congestion and sinus pressure.   Respiratory: Negative for cough, chest tightness and shortness of breath.   Cardiovascular: Negative for chest pain, palpitations and leg swelling.  Gastrointestinal: Negative for nausea, vomiting, abdominal pain and diarrhea.  Neurological: Negative for dizziness, light-headedness and headaches.      Objective:    Physical Exam  Constitutional: She appears well-developed and well-nourished. No distress.  HENT:  Nose: Nose normal.  Mouth/Throat: Oropharynx is clear and moist.  Neck: Neck supple. No thyromegaly present.  Cardiovascular: Normal rate and regular rhythm.   Pulmonary/Chest: Breath sounds normal. No respiratory distress. She has no wheezes.  Abdominal: Soft. Bowel sounds are normal. There is no tenderness.  Musculoskeletal: She exhibits no edema or tenderness.  Lymphadenopathy:    She has no cervical adenopathy.  Skin: No rash noted. No erythema.  Psychiatric: She has a normal mood and affect. Her behavior is normal.    BP 120/70 mmHg  Pulse 74  Temp(Src) 97.8 F (36.6 C) (Oral)  Ht 5' 9.25" (1.759 m)  Wt 272 lb 8 oz (123.605 kg)  BMI 39.95 kg/m2  SpO2 97% Wt Readings from Last 3 Encounters:  04/25/14 272 lb 8 oz (123.605 kg)  12/22/13 285 lb 8 oz (129.502 kg)  08/18/13 287 lb 4 oz (130.296 kg)     Lab Results  Component Value Date   WBC 4.9 08/15/2013   HGB 13.1 08/15/2013   HCT 38.6 08/15/2013   PLT 176.0 08/15/2013   GLUCOSE 132* 04/20/2014   CHOL 167 04/20/2014   TRIG 66.0 04/20/2014   HDL 56.90 04/20/2014   LDLCALC 97 04/20/2014   ALT 10 04/20/2014   AST 15 04/20/2014   NA 138 04/20/2014   K 4.1 04/20/2014  CL 105 04/20/2014   CREATININE 0.84 04/20/2014   BUN 22 04/20/2014   CO2 29 04/20/2014   TSH 1.18 08/15/2013   HGBA1C 6.8* 04/20/2014   MICROALBUR 0.6 12/20/2013       Assessment & Plan:   Problem List Items Addressed This Visit    Diabetes    Has adjusted her diet and lost weight.  a1c just checked 6.8.  Follow met b and a1c.       Relevant Orders   Lipid panel   Hepatic function panel   Hemoglobin A1c   Essential hypertension, benign    Blood pressure doing well.  Continue same medication regimen.  Follow met b.       Relevant Orders   Basic metabolic panel   GERD (gastroesophageal reflux disease)    No reflux  reported.  Has adjusted her diet and lost weight.       Myasthenia gravis    Currently stable.        Obesity (BMI 30-39.9)    Has adjusted her diet and lost weight.  Continue.        SVT (supraventricular tachycardia) - Primary    Had stress test - negative for ischemia.  Saw Dr Nehemiah Massed.  On metoprolol.  Symptoms better.  Feels better.  Follow.           Einar Pheasant, MD

## 2014-04-25 NOTE — Patient Instructions (Signed)
Saline nasal spray - flush nose at lease 2-3x/day  nasacort nasal spray - 2 sprays each nostril one time per day  Robitussin twice a day as needed.

## 2014-04-30 ENCOUNTER — Encounter: Payer: Self-pay | Admitting: Internal Medicine

## 2014-04-30 NOTE — Assessment & Plan Note (Signed)
Had stress test - negative for ischemia.  Saw Dr Gwen PoundsKowalski.  On metoprolol.  Symptoms better.  Feels better.  Follow.

## 2014-04-30 NOTE — Assessment & Plan Note (Signed)
Has adjusted her diet and lost weight.  a1c just checked 6.8.  Follow met b and a1c.

## 2014-04-30 NOTE — Assessment & Plan Note (Addendum)
Blood pressure doing well.  Continue same medication regimen.  Follow met b.   

## 2014-04-30 NOTE — Assessment & Plan Note (Signed)
Has adjusted her diet and lost weight.  Continue.

## 2014-04-30 NOTE — Assessment & Plan Note (Signed)
No reflux reported.  Has adjusted her diet and lost weight.

## 2014-04-30 NOTE — Assessment & Plan Note (Signed)
Currently stable.

## 2014-06-19 ENCOUNTER — Other Ambulatory Visit: Payer: Self-pay | Admitting: Internal Medicine

## 2014-08-29 ENCOUNTER — Other Ambulatory Visit: Payer: BLUE CROSS/BLUE SHIELD

## 2014-08-31 ENCOUNTER — Encounter: Payer: BLUE CROSS/BLUE SHIELD | Admitting: Internal Medicine

## 2014-10-02 ENCOUNTER — Other Ambulatory Visit: Payer: Self-pay | Admitting: Internal Medicine

## 2014-10-04 ENCOUNTER — Other Ambulatory Visit: Payer: Self-pay | Admitting: Internal Medicine

## 2014-11-10 ENCOUNTER — Other Ambulatory Visit: Payer: BLUE CROSS/BLUE SHIELD

## 2014-11-13 ENCOUNTER — Encounter: Payer: Self-pay | Admitting: Internal Medicine

## 2014-11-13 ENCOUNTER — Ambulatory Visit (INDEPENDENT_AMBULATORY_CARE_PROVIDER_SITE_OTHER): Payer: BLUE CROSS/BLUE SHIELD | Admitting: Internal Medicine

## 2014-11-13 VITALS — BP 110/70 | HR 71 | Temp 98.1°F | Resp 18 | Ht 69.25 in | Wt 277.5 lb

## 2014-11-13 DIAGNOSIS — E669 Obesity, unspecified: Secondary | ICD-10-CM

## 2014-11-13 DIAGNOSIS — Z23 Encounter for immunization: Secondary | ICD-10-CM | POA: Diagnosis not present

## 2014-11-13 DIAGNOSIS — K219 Gastro-esophageal reflux disease without esophagitis: Secondary | ICD-10-CM

## 2014-11-13 DIAGNOSIS — I10 Essential (primary) hypertension: Secondary | ICD-10-CM

## 2014-11-13 DIAGNOSIS — G7000 Myasthenia gravis without (acute) exacerbation: Secondary | ICD-10-CM

## 2014-11-13 DIAGNOSIS — Z1239 Encounter for other screening for malignant neoplasm of breast: Secondary | ICD-10-CM

## 2014-11-13 DIAGNOSIS — Z0000 Encounter for general adult medical examination without abnormal findings: Secondary | ICD-10-CM | POA: Diagnosis not present

## 2014-11-13 DIAGNOSIS — I471 Supraventricular tachycardia: Secondary | ICD-10-CM | POA: Diagnosis not present

## 2014-11-13 DIAGNOSIS — E119 Type 2 diabetes mellitus without complications: Secondary | ICD-10-CM

## 2014-11-13 MED ORDER — METOPROLOL TARTRATE 25 MG PO TABS: 25.0000 mg | ORAL_TABLET | Freq: Two times a day (BID) | ORAL | Status: DC

## 2014-11-13 MED ORDER — LISINOPRIL-HYDROCHLOROTHIAZIDE 10-12.5 MG PO TABS: 1.0000 | ORAL_TABLET | Freq: Every day | ORAL | Status: DC

## 2014-11-13 MED ORDER — METFORMIN HCL 1000 MG PO TABS: 1000.0000 mg | ORAL_TABLET | Freq: Two times a day (BID) | ORAL | Status: DC

## 2014-11-13 NOTE — Progress Notes (Signed)
Pre-visit discussion using our clinic review tool. No additional management support is needed unless otherwise documented below in the visit note.  

## 2014-11-13 NOTE — Patient Instructions (Signed)

## 2014-11-13 NOTE — Progress Notes (Signed)
Patient ID: Amber Decker, female   DOB: 1955-02-16, 59 y.o.   MRN: [id number]   Subjective:    Patient ID: Amber Decker, female    DOB: 06-28-1955, 59 y.o.   MRN: [id number]  HPI  Patient with past history of hypertension, diabetes and myasthenia gravis.  She comes in today to follow up on these issues as well as for a complete physical exam.  She is doing better.  Feels better.  Discussed diet and exercise.  No cardiac symptoms with increased activity or exertion.  No sob.  No acid reflux.  No abdominal pain or cramping.  Brought in no recorded sugar readings.  Has not been checking regularly.  States am sugars averaging 110-125 and evening sugars averaging 185-200.     Past Medical History  Diagnosis Date  . Hypertension   . Diabetes mellitus without complication ([place])   . Myasthenia gravis ([place])   . Herpes   . SVT (supraventricular tachycardia) [facility])    Past Surgical History  Procedure Laterality Date  . Uterine fibroid surgery     Family History  Problem Relation Age of Onset  . Diabetes Mother   . Alcohol abuse Father   . Heart disease Father     myocardial infarction  . Breast cancer Neg Hx   . Colon cancer Neg Hx    Social History   Social History  . Marital Status: Legally Separated    Spouse Name: N/A  . Number of Children: 0  . Years of Education: N/A   Occupational History  .  Other   Social History Main Topics  . Smoking status: Former Research scientist (life sciences)  . Smokeless tobacco: Never Used  . Alcohol Use: No  . Drug Use: No  . Sexual Activity: Not Asked   Other Topics Concern  . None   Social History Narrative    Outpatient Encounter Prescriptions as of 11/13/2014  Medication Sig  . acyclovir (ZOVIRAX) 200 MG capsule TAKE TWO CAPSULES BY MOUTH ONCE DAILY  . Biotin 1000 MCG tablet Take 1,000 mcg by mouth daily.  . cholecalciferol (VITAMIN D) 1000 UNITS tablet Take 1,000 Units by mouth daily.  [name] lisinopril-hydrochlorothiazide (PRINZIDE,ZESTORETIC) 10-12.5 MG  tablet Take 1 tablet by mouth daily.  . metFORMIN (GLUCOPHAGE) 1000 MG tablet Take 1 tablet (1,000 mg total) by mouth 2 (two) times daily with a meal.  . metoprolol tartrate (LOPRESSOR) 25 MG tablet Take 1 tablet (25 mg total) by mouth 2 (two) times daily.  . Multiple Vitamins-Minerals (ALIVE WOMENS 50+ PO) Take by mouth daily.  . Naphazoline-Polyethyl Glycol (EYE DROPS) 0.012-0.2 % SOLN Apply to eye.  . ranitidine (ZANTAC) 150 MG tablet Take 150 mg by mouth as needed for heartburn.  . [DISCONTINUED] lisinopril-hydrochlorothiazide (PRINZIDE,ZESTORETIC) 10-12.5 MG per tablet TAKE ONE TABLET BY MOUTH ONCE DAILY  . [DISCONTINUED] metFORMIN (GLUCOPHAGE) 1000 MG tablet TAKE ONE TABLET BY MOUTH TWICE DAILY WITH A MEAL  . [DISCONTINUED] metoprolol tartrate (LOPRESSOR) 25 MG tablet TAKE ONE TABLET BY MOUTH TWICE DAILY   No facility-administered encounter medications on file as of 11/13/2014.    Review of Systems  Constitutional: Negative for appetite change and unexpected weight change.  HENT: Negative for congestion and sinus pressure.   Eyes: Negative for pain and visual disturbance.  Respiratory: Negative for cough, chest tightness and shortness of breath.   Cardiovascular: Negative for chest pain, palpitations and leg swelling.  Gastrointestinal: Negative for nausea, vomiting, abdominal pain and diarrhea.  Genitourinary: Negative for dysuria and difficulty urinating.  Musculoskeletal: Negative for back pain and joint swelling.  Skin: Negative for color change and rash.  Neurological: Negative for dizziness, light-headedness and headaches.  Hematological: Negative for adenopathy. Does not bruise/bleed easily.  Psychiatric/Behavioral: Negative for dysphoric mood and agitation.       Objective:    Physical Exam  Constitutional: She is oriented to person, place, and time. She appears well-developed and well-nourished. No distress.  HENT:  Nose: Nose normal.  Mouth/Throat: Oropharynx is  clear and moist.  Eyes: Right eye exhibits no discharge. Left eye exhibits no discharge. No scleral icterus.  Neck: Neck supple. No thyromegaly present.  Cardiovascular: Normal rate and regular rhythm.   Pulmonary/Chest: Breath sounds normal. No accessory muscle usage. No tachypnea. No respiratory distress. She has no decreased breath sounds. She has no wheezes. She has no rhonchi. Right breast exhibits no inverted nipple, no mass, no nipple discharge and no tenderness (no axillary adenopathy). Left breast exhibits no inverted nipple, no mass, no nipple discharge and no tenderness (no axilarry adenopathy).  Abdominal: Soft. Bowel sounds are normal. There is no tenderness.  Musculoskeletal: She exhibits no edema or tenderness.  Lymphadenopathy:    She has no cervical adenopathy.  Neurological: She is alert and oriented to person, place, and time.  Skin: Skin is warm. No rash noted. No erythema.  Psychiatric: She has a normal mood and affect. Her behavior is normal.    BP 110/70 mmHg  Pulse 71  Temp(Src) 98.1 F (36.7 C) (Oral)  Resp 18  Ht 5' 9.25" (1.759 m)  Wt 277 lb 8 oz (125.873 kg)  BMI 40.68 kg/m2  SpO2 98% Wt Readings from Last 3 Encounters:  11/13/14 277 lb 8 oz (125.873 kg)  04/25/14 272 lb 8 oz (123.605 kg)  12/22/13 285 lb 8 oz (129.502 kg)     Lab Results  Component Value Date   WBC 4.9 08/15/2013   HGB 13.1 08/15/2013   HCT 38.6 08/15/2013   PLT 176.0 08/15/2013   GLUCOSE 127* 11/17/2014   CHOL 183 11/17/2014   TRIG 75.0 11/17/2014   HDL 74.20 11/17/2014   LDLCALC 94 11/17/2014   ALT 14 11/17/2014   AST 15 11/17/2014   NA 141 11/17/2014   K 4.5 11/17/2014   CL 105 11/17/2014   CREATININE 0.90 11/17/2014   BUN 20 11/17/2014   CO2 28 11/17/2014   TSH 1.18 08/15/2013   HGBA1C 6.9* 11/17/2014   MICROALBUR 0.6 12/20/2013       Assessment & Plan:   Problem List Items Addressed This Visit    Diabetes ([place])    Sugars as outlined.  Discussed diet and  exercise.  Check met b and a1c.        Relevant Medications   lisinopril-hydrochlorothiazide (PRINZIDE,ZESTORETIC) 10-12.5 MG tablet   metFORMIN (GLUCOPHAGE) 1000 MG tablet   Essential hypertension, benign    Blood pressure under good control.  Continue same medication regimen.  Follow pressures.  Follow metabolic panel.        Relevant Medications   lisinopril-hydrochlorothiazide (PRINZIDE,ZESTORETIC) 10-12.5 MG tablet   metoprolol tartrate (LOPRESSOR) 25 MG tablet   GERD (gastroesophageal reflux disease)    No reflux symptoms reported.  Follow.        Health care maintenance    Physical today 11/13/14.  PAP 03/23/12 - negative with negative HPV.  Overdue mammogram.  Schedule mammogram.        Myasthenia gravis ([place])    Stable.       Obesity (BMI 30-39.9)  Diet and exercise.  Follow.        Relevant Medications   metFORMIN (GLUCOPHAGE) 1000 MG tablet   SVT (supraventricular tachycardia) ([facility])    On metoprolol.  Previous stress test - negative for ischemia.        Relevant Medications   lisinopril-hydrochlorothiazide (PRINZIDE,ZESTORETIC) 10-12.5 MG tablet   metoprolol tartrate (LOPRESSOR) 25 MG tablet    Other Visit Diagnoses    Screening breast examination    -  Primary    Relevant Orders    MM DIGITAL SCREENING BILATERAL (Completed)    Encounter for immunization            Einar Pheasant, MD

## 2014-11-17 ENCOUNTER — Ambulatory Visit
Admission: RE | Admit: 2014-11-17 | Discharge: 2014-11-17 | Disposition: A | Discharge status: Home / Self Care | Payer: BLUE CROSS/BLUE SHIELD | Source: Ambulatory Visit | Attending: Internal Medicine | Admitting: Internal Medicine

## 2014-11-17 ENCOUNTER — Other Ambulatory Visit (INDEPENDENT_AMBULATORY_CARE_PROVIDER_SITE_OTHER): Payer: BLUE CROSS/BLUE SHIELD

## 2014-11-17 DIAGNOSIS — I10 Essential (primary) hypertension: Secondary | ICD-10-CM | POA: Diagnosis not present

## 2014-11-17 DIAGNOSIS — Z1231 Encounter for screening mammogram for malignant neoplasm of breast: Secondary | ICD-10-CM | POA: Insufficient documentation

## 2014-11-17 DIAGNOSIS — Z1239 Encounter for other screening for malignant neoplasm of breast: Secondary | ICD-10-CM

## 2014-11-17 DIAGNOSIS — E119 Type 2 diabetes mellitus without complications: Secondary | ICD-10-CM | POA: Diagnosis not present

## 2014-11-17 LAB — LIPID PANEL
Cholesterol: 183 mg/dL (ref 0–200)
HDL: 74.2 mg/dL (ref >39.00)
LDL Cholesterol: 94 mg/dL (ref 0–99)
NonHDL: 109.04
Total CHOL/HDL Ratio: 2
Triglycerides: 75 mg/dL (ref 0.0–149.0)
VLDL: 15 mg/dL (ref 0.0–40.0)

## 2014-11-17 LAB — HEPATIC FUNCTION PANEL
ALT: 14 U/L (ref 0–35)
AST: 15 U/L (ref 0–37)
Albumin: 4.1 g/dL (ref 3.5–5.2)
Alkaline Phosphatase: 51 U/L (ref 39–117)
Bilirubin, Direct: 0.1 mg/dL (ref 0.0–0.3)
Total Bilirubin: 0.5 mg/dL (ref 0.2–1.2)
Total Protein: 7.1 g/dL (ref 6.0–8.3)

## 2014-11-17 LAB — BASIC METABOLIC PANEL
BUN: 20 mg/dL (ref 6–23)
CO2: 28 mEq/L (ref 19–32)
Calcium: 9.8 mg/dL (ref 8.4–10.5)
Chloride: 105 mEq/L (ref 96–112)
Creatinine, Ser: 0.9 mg/dL (ref 0.40–1.20)
GFR: 82.61 mL/min (ref >60.00)
Glucose, Bld: 127 mg/dL — ABNORMAL HIGH (ref 70–99)
Potassium: 4.5 mEq/L (ref 3.5–5.1)
Sodium: 141 mEq/L (ref 135–145)

## 2014-11-17 LAB — HEMOGLOBIN A1C: Hgb A1c MFr Bld: 6.9 % — ABNORMAL HIGH (ref 4.6–6.5)

## 2014-11-18 ENCOUNTER — Other Ambulatory Visit: Payer: Self-pay | Admitting: Internal Medicine

## 2014-11-18 ENCOUNTER — Encounter: Payer: Self-pay | Admitting: Internal Medicine

## 2014-11-18 DIAGNOSIS — Z0000 Encounter for general adult medical examination without abnormal findings: Secondary | ICD-10-CM | POA: Insufficient documentation

## 2014-11-19 ENCOUNTER — Encounter: Payer: Self-pay | Admitting: Internal Medicine

## 2014-11-19 NOTE — Assessment & Plan Note (Signed)
On metoprolol.  Previous stress test - negative for ischemia.

## 2014-11-19 NOTE — Assessment & Plan Note (Signed)
Sugars as outlined.  Discussed diet and exercise.  Check met b and a1c.

## 2014-11-19 NOTE — Assessment & Plan Note (Signed)
Diet and exercise.  Follow.  

## 2014-11-19 NOTE — Assessment & Plan Note (Signed)
Physical today 11/13/14.  PAP 03/23/12 - negative with negative HPV.  Overdue mammogram.  Schedule mammogram.

## 2014-11-19 NOTE — Assessment & Plan Note (Signed)
No reflux symptoms reported.  Follow.

## 2014-11-19 NOTE — Assessment & Plan Note (Signed)
Stable

## 2014-11-19 NOTE — Assessment & Plan Note (Signed)
Blood pressure under good control.  Continue same medication regimen.  Follow pressures.  Follow metabolic panel.   

## 2014-11-20 ENCOUNTER — Encounter: Payer: Self-pay | Admitting: *Deleted

## 2014-12-07 ENCOUNTER — Telehealth: Payer: Self-pay | Admitting: Internal Medicine

## 2014-12-07 ENCOUNTER — Other Ambulatory Visit: Payer: Self-pay

## 2014-12-07 MED ORDER — LISINOPRIL-HYDROCHLOROTHIAZIDE 10-12.5 MG PO TABS: 1.0000 | ORAL_TABLET | Freq: Every day | ORAL | Status: DC

## 2014-12-07 MED ORDER — METOPROLOL TARTRATE 25 MG PO TABS: 25.0000 mg | ORAL_TABLET | Freq: Two times a day (BID) | ORAL | Status: DC

## 2014-12-07 NOTE — Telephone Encounter (Signed)
Refilled per request.

## 2014-12-07 NOTE — Telephone Encounter (Signed)
Pt request refill on  Metoprolol and Lisinopril-hydrochlorothiazide/msn

## 2015-03-16 ENCOUNTER — Ambulatory Visit: Payer: BLUE CROSS/BLUE SHIELD | Admitting: Internal Medicine

## 2015-03-16 ENCOUNTER — Telehealth: Payer: Self-pay | Admitting: Internal Medicine

## 2015-03-16 NOTE — Telephone Encounter (Signed)
FYI, Pt called about not feeling well to come in today for her appt. Appt is still on the schedule. Pt did reschedule appt. Thank you!

## 2015-03-16 NOTE — Telephone Encounter (Signed)
Okay to remove from schedule  

## 2015-03-16 NOTE — Telephone Encounter (Signed)
Done

## 2015-03-16 NOTE — Telephone Encounter (Signed)
Ok to take off schedule.  Thanks

## 2015-03-21 ENCOUNTER — Encounter: Payer: Self-pay | Admitting: Internal Medicine

## 2015-03-21 ENCOUNTER — Ambulatory Visit (INDEPENDENT_AMBULATORY_CARE_PROVIDER_SITE_OTHER): Payer: BLUE CROSS/BLUE SHIELD | Admitting: Internal Medicine

## 2015-03-21 VITALS — BP 120/70 | HR 62 | Temp 98.3°F | Ht 69.25 in | Wt 276.8 lb

## 2015-03-21 DIAGNOSIS — G7000 Myasthenia gravis without (acute) exacerbation: Secondary | ICD-10-CM | POA: Diagnosis not present

## 2015-03-21 DIAGNOSIS — I10 Essential (primary) hypertension: Secondary | ICD-10-CM

## 2015-03-21 DIAGNOSIS — E119 Type 2 diabetes mellitus without complications: Secondary | ICD-10-CM | POA: Diagnosis not present

## 2015-03-21 DIAGNOSIS — I471 Supraventricular tachycardia: Secondary | ICD-10-CM | POA: Diagnosis not present

## 2015-03-21 DIAGNOSIS — E669 Obesity, unspecified: Secondary | ICD-10-CM

## 2015-03-21 LAB — COMPREHENSIVE METABOLIC PANEL WITH GFR
ALT: 12 U/L (ref 0–35)
AST: 15 U/L (ref 0–37)
Albumin: 4.2 g/dL (ref 3.5–5.2)
Alkaline Phosphatase: 53 U/L (ref 39–117)
BUN: 22 mg/dL (ref 6–23)
CO2: 28 meq/L (ref 19–32)
Calcium: 9.5 mg/dL (ref 8.4–10.5)
Chloride: 106 meq/L (ref 96–112)
Creatinine, Ser: 0.77 mg/dL (ref 0.40–1.20)
GFR: 98.79 mL/min
Glucose, Bld: 156 mg/dL — ABNORMAL HIGH (ref 70–99)
Potassium: 4.3 meq/L (ref 3.5–5.1)
Sodium: 140 meq/L (ref 135–145)
Total Bilirubin: 0.5 mg/dL (ref 0.2–1.2)
Total Protein: 7.3 g/dL (ref 6.0–8.3)

## 2015-03-21 LAB — HEMOGLOBIN A1C: Hgb A1c MFr Bld: 7.5 % — ABNORMAL HIGH (ref 4.6–6.5)

## 2015-03-21 LAB — CBC WITH DIFFERENTIAL/PLATELET
Basophils Absolute: 0 10*3/uL (ref 0.0–0.1)
Basophils Relative: 0.2 % (ref 0.0–3.0)
Eosinophils Absolute: 0.1 10*3/uL (ref 0.0–0.7)
Eosinophils Relative: 1.8 % (ref 0.0–5.0)
HCT: 39.3 % (ref 36.0–46.0)
Hemoglobin: 13 g/dL (ref 12.0–15.0)
Lymphocytes Relative: 42 % (ref 12.0–46.0)
Lymphs Abs: 1.9 10*3/uL (ref 0.7–4.0)
MCHC: 33.1 g/dL (ref 30.0–36.0)
MCV: 89.3 fl (ref 78.0–100.0)
Monocytes Absolute: 0.3 10*3/uL (ref 0.1–1.0)
Monocytes Relative: 7.4 % (ref 3.0–12.0)
Neutro Abs: 2.2 10*3/uL (ref 1.4–7.7)
Neutrophils Relative %: 48.6 % (ref 43.0–77.0)
Platelets: 172 10*3/uL (ref 150.0–400.0)
RBC: 4.4 Mil/uL (ref 3.87–5.11)
RDW: 13.3 % (ref 11.5–15.5)
WBC: 4.6 10*3/uL (ref 4.0–10.5)

## 2015-03-21 LAB — TSH: TSH: 1.43 u[IU]/mL (ref 0.35–4.50)

## 2015-03-21 NOTE — Progress Notes (Signed)
Patient ID: Amber Decker, female   DOB: [birth year], 60 y.o.   MRN: [id number]   Subjective:    Patient ID: Amber Decker, female    DOB: 1955-03-22, 60 y.o.   MRN: [id number]  HPI  Patient with past history of hypertension, SVT, diabetes and myasthenia gravis.  She comes in today to follow up on these issues.  She is walking.  Not checking her sugars.  States am sugars averaging 130s.  No chest pain or tightness.  No increased heart rate or palpitations.  Feels doing better.  No acid reflux.  No abdominal pain or cramping.  Bowels stable.     Past Medical History  Diagnosis Date  . Hypertension   . Diabetes mellitus without complication ([facility])   . Myasthenia gravis ([facility])   . Herpes   . SVT (supraventricular tachycardia) [facility])    Past Surgical History  Procedure Laterality Date  . Uterine fibroid surgery     Family History  Problem Relation Age of Onset  . Diabetes Mother   . Alcohol abuse Father   . Heart disease Father     myocardial infarction  . Breast cancer Neg Hx   . Colon cancer Neg Hx    Social History   Social History  . Marital Status: Legally Separated    Spouse Name: N/A  . Number of Children: 0  . Years of Education: N/A   Occupational History  .  Other   Social History Main Topics  . Smoking status: Former Games developer  . Smokeless tobacco: Never Used  . Alcohol Use: No  . Drug Use: No  . Sexual Activity: Not Asked   Other Topics Concern  . None   Social History Narrative    Outpatient Encounter Prescriptions as of 03/21/2015  Medication Sig  . acyclovir (ZOVIRAX) 200 MG capsule TAKE TWO CAPSULES BY MOUTH ONCE DAILY  . acyclovir (ZOVIRAX) 400 MG tablet TAKE 1 TABLET BY MOUTH EVERY DAY  . Biotin 1000 MCG tablet Take 1,000 mcg by mouth daily.  . cholecalciferol (VITAMIN D) 1000 UNITS tablet Take 1,000 Units by mouth daily.  [name] lisinopril-hydrochlorothiazide (PRINZIDE,ZESTORETIC) 10-12.5 MG tablet Take 1 tablet by mouth daily.  . metFORMIN  (GLUCOPHAGE) 1000 MG tablet Take 1 tablet (1,000 mg total) by mouth 2 (two) times daily with a meal.  . metoprolol tartrate (LOPRESSOR) 25 MG tablet Take 1 tablet (25 mg total) by mouth 2 (two) times daily.  . Multiple Vitamins-Minerals (ALIVE WOMENS 50+ PO) Take by mouth daily.  . Naphazoline-Polyethyl Glycol (EYE DROPS) 0.012-0.2 % SOLN Apply to eye.  . ranitidine (ZANTAC) 150 MG tablet Take 150 mg by mouth as needed for heartburn.   No facility-administered encounter medications on file as of 03/21/2015.    Review of Systems  Constitutional: Negative for appetite change and unexpected weight change.  HENT: Negative for congestion and sinus pressure.   Respiratory: Negative for cough, chest tightness and shortness of breath.   Cardiovascular: Negative for chest pain, palpitations and leg swelling.  Gastrointestinal: Negative for nausea, vomiting, abdominal pain and diarrhea.  Genitourinary: Negative for dysuria and difficulty urinating.  Musculoskeletal: Negative for back pain and joint swelling.  Skin: Negative for color change and rash.  Neurological: Negative for dizziness, light-headedness and headaches.  Psychiatric/Behavioral: Negative for dysphoric mood and agitation.       Objective:    Physical Exam  Constitutional: She appears well-developed and well-nourished. No distress.  HENT:  Nose: Nose normal.  Mouth/Throat: Oropharynx is clear  and moist.  Eyes: Conjunctivae are normal. Right eye exhibits no discharge. Left eye exhibits no discharge.  Neck: Neck supple. No thyromegaly present.  Cardiovascular: Normal rate and regular rhythm.   Pulmonary/Chest: Breath sounds normal. No respiratory distress. She has no wheezes.  Abdominal: Soft. Bowel sounds are normal. There is no tenderness.  Musculoskeletal: She exhibits no edema or tenderness.  Lymphadenopathy:    She has no cervical adenopathy.  Skin: No rash noted. No erythema.  Psychiatric: She has a normal mood and  affect. Her behavior is normal.    BP 120/70 mmHg  Pulse 62  Temp(Src) 98.3 F (36.8 C) (Oral)  Ht 5' 9.25" (1.759 m)  Wt 276 lb 12 oz (125.533 kg)  BMI 40.57 kg/m2  SpO2 96%  LMP 02/21/2012 Wt Readings from Last 3 Encounters:  03/21/15 276 lb 12 oz (125.533 kg)  11/13/14 277 lb 8 oz (125.873 kg)  04/25/14 272 lb 8 oz (123.605 kg)     Lab Results  Component Value Date   WBC 4.6 03/21/2015   HGB 13.0 03/21/2015   HCT 39.3 03/21/2015   PLT 172.0 03/21/2015   GLUCOSE 156* 03/21/2015   CHOL 183 11/17/2014   TRIG 75.0 11/17/2014   HDL 74.20 11/17/2014   LDLCALC 94 11/17/2014   ALT 12 03/21/2015   AST 15 03/21/2015   NA 140 03/21/2015   K 4.3 03/21/2015   CL 106 03/21/2015   CREATININE 0.77 03/21/2015   BUN 22 03/21/2015   CO2 28 03/21/2015   TSH 1.43 03/21/2015   HGBA1C 7.5* 03/21/2015   MICROALBUR 0.6 12/20/2013    Mm Digital Screening Bilateral  11/17/2014  CLINICAL DATA:  Screening. EXAM: DIGITAL SCREENING BILATERAL MAMMOGRAM WITH CAD COMPARISON:  Previous exam(s). ACR Breast Density Category b: There are scattered areas of fibroglandular density. FINDINGS: There are no findings suspicious for malignancy. Images were processed with CAD. IMPRESSION: No mammographic evidence of malignancy. A result letter of this screening mammogram will be mailed directly to the patient. RECOMMENDATION: Screening mammogram in one year. (Code:SM-B-01Y) BI-RADS CATEGORY  1: Negative. Electronically Signed   By: Claudie Revering M.D.   On: 11/17/2014 11:19       Assessment & Plan:   Problem List Items Addressed This Visit    Diabetes ([place])    Not checking sugars.  Discussed diet and exercises.  Follow met b and a1c.        Relevant Orders   Hemoglobin A1c (Completed)   Essential hypertension, benign - Primary    Blood pressure under good control.  Continue same medication regimen.  Follow pressures.  Follow metabolic panel.        Relevant Orders   TSH (Completed)   CBC with  Differential/Platelet (Completed)   Comprehensive metabolic panel (Completed)   Myasthenia gravis ([place])    Stable.  Follow.        Obesity (BMI 30-39.9)    Discussed diet and exercise.  Follow       SVT (supraventricular tachycardia) ([facility])    Previous stress test - negative for ischemia.  On metoprolol.  Doing better.            Einar Pheasant, MD

## 2015-03-21 NOTE — Progress Notes (Signed)
Pre visit review using our clinic review tool, if applicable. No additional management support is needed unless otherwise documented below in the visit note. 

## 2015-03-26 ENCOUNTER — Encounter: Payer: Self-pay | Admitting: Internal Medicine

## 2015-03-26 NOTE — Assessment & Plan Note (Signed)
Not checking sugars.  Discussed diet and exercises.  Follow met b and a1c.

## 2015-03-26 NOTE — Assessment & Plan Note (Signed)
Previous stress test - negative for ischemia.  On metoprolol.  Doing better.

## 2015-03-26 NOTE — Assessment & Plan Note (Signed)
Discussed diet and exercise.  Follow.  

## 2015-03-26 NOTE — Assessment & Plan Note (Signed)
Blood pressure under good control.  Continue same medication regimen.  Follow pressures.  Follow metabolic panel.   

## 2015-03-26 NOTE — Assessment & Plan Note (Signed)
Stable.  Follow.   

## 2015-04-10 ENCOUNTER — Ambulatory Visit (INDEPENDENT_AMBULATORY_CARE_PROVIDER_SITE_OTHER): Payer: BLUE CROSS/BLUE SHIELD | Admitting: Internal Medicine

## 2015-04-10 ENCOUNTER — Encounter: Payer: Self-pay | Admitting: Internal Medicine

## 2015-04-10 VITALS — BP 126/70 | HR 94 | Temp 98.5°F | Resp 18 | Ht 69.25 in | Wt 275.5 lb

## 2015-04-10 DIAGNOSIS — E119 Type 2 diabetes mellitus without complications: Secondary | ICD-10-CM | POA: Diagnosis not present

## 2015-04-10 DIAGNOSIS — E669 Obesity, unspecified: Secondary | ICD-10-CM

## 2015-04-10 DIAGNOSIS — G7000 Myasthenia gravis without (acute) exacerbation: Secondary | ICD-10-CM | POA: Diagnosis not present

## 2015-04-10 DIAGNOSIS — I10 Essential (primary) hypertension: Secondary | ICD-10-CM | POA: Diagnosis not present

## 2015-04-10 NOTE — Progress Notes (Addendum)
Patient ID: Amber Decker, female   DOB: [birth year], 60 y.o.   MRN: [id number]   Subjective:    Patient ID: Amber Decker, female    DOB: 1955-05-06, 60 y.o.   MRN: [id number]  HPI  Patient here for a f/u of her diabetes.  Recent a1c elevated. Discussed diet and exercise.  Discussed her lab results.  Discussed importance of checking her sugars.  Discussed the need for weight loss.  Breathing stable.  No cardiac symptoms with increased activity or exertion.      Past Medical History  Diagnosis Date  . Hypertension   . Diabetes mellitus without complication ([place])   . Myasthenia gravis ([place])   . Herpes   . SVT (supraventricular tachycardia) [facility])    Past Surgical History  Procedure Laterality Date  . Uterine fibroid surgery     Family History  Problem Relation Age of Onset  . Diabetes Mother   . Alcohol abuse Father   . Heart disease Father     myocardial infarction  . Breast cancer Neg Hx   . Colon cancer Neg Hx    Social History   Social History  . Marital Status: Legally Separated    Spouse Name: N/A  . Number of Children: 0  . Years of Education: N/A   Occupational History  .  Other   Social History Main Topics  . Smoking status: Former Research scientist (life sciences)  . Smokeless tobacco: Never Used  . Alcohol Use: No  . Drug Use: No  . Sexual Activity: Not Asked   Other Topics Concern  . None   Social History Narrative    Outpatient Encounter Prescriptions as of 04/10/2015  Medication Sig  . acyclovir (ZOVIRAX) 200 MG capsule TAKE TWO CAPSULES BY MOUTH ONCE DAILY  . acyclovir (ZOVIRAX) 400 MG tablet TAKE 1 TABLET BY MOUTH EVERY DAY  . Biotin 1000 MCG tablet Take 1,000 mcg by mouth daily.  . cholecalciferol (VITAMIN D) 1000 UNITS tablet Take 1,000 Units by mouth daily.  [name] lisinopril-hydrochlorothiazide (PRINZIDE,ZESTORETIC) 10-12.5 MG tablet Take 1 tablet by mouth daily.  . metFORMIN (GLUCOPHAGE) 1000 MG tablet Take 1 tablet (1,000 mg total) by mouth 2 (two) times daily with a  meal.  . metoprolol tartrate (LOPRESSOR) 25 MG tablet Take 1 tablet (25 mg total) by mouth 2 (two) times daily.  . Multiple Vitamins-Minerals (ALIVE WOMENS 50+ PO) Take by mouth daily.  . Naphazoline-Polyethyl Glycol (EYE DROPS) 0.012-0.2 % SOLN Apply to eye.  . ranitidine (ZANTAC) 150 MG tablet Take 150 mg by mouth as needed for heartburn.   No facility-administered encounter medications on file as of 04/10/2015.    Review of Systems  Constitutional: Negative for appetite change and unexpected weight change.  HENT: Negative for congestion and sinus pressure.   Respiratory: Negative for cough, chest tightness and shortness of breath.   Cardiovascular: Negative for chest pain, palpitations and leg swelling.  Gastrointestinal: Negative for nausea, vomiting, abdominal pain and diarrhea.  Genitourinary: Negative for dysuria and difficulty urinating.  Musculoskeletal: Negative for back pain and joint swelling.  Neurological: Negative for dizziness, light-headedness and headaches.  Psychiatric/Behavioral: Negative for dysphoric mood and agitation.       Objective:    Physical Exam  Constitutional: She appears well-developed and well-nourished. No distress.  HENT:  Nose: Nose normal.  Mouth/Throat: Oropharynx is clear and moist.  Neck: Neck supple. No thyromegaly present.  Cardiovascular: Normal rate and regular rhythm.   Pulmonary/Chest: Breath sounds normal. No respiratory distress. She has  no wheezes.  Abdominal: Soft. Bowel sounds are normal. There is no tenderness.  Musculoskeletal: She exhibits no edema or tenderness.  Lymphadenopathy:    She has no cervical adenopathy.  Skin: No rash noted. No erythema.  Psychiatric: She has a normal mood and affect. Her behavior is normal.    BP 126/70 mmHg  Pulse 94  Temp(Src) 98.5 F (36.9 C) (Oral)  Resp 18  Ht 5' 9.25" (1.759 m)  Wt 275 lb 8 oz (124.966 kg)  BMI 40.39 kg/m2  SpO2 97%  LMP 02/21/2012 Wt Readings from Last 3  Encounters:  04/10/15 275 lb 8 oz (124.966 kg)  03/21/15 276 lb 12 oz (125.533 kg)  11/13/14 277 lb 8 oz (125.873 kg)     Lab Results  Component Value Date   WBC 4.6 03/21/2015   HGB 13.0 03/21/2015   HCT 39.3 03/21/2015   PLT 172.0 03/21/2015   GLUCOSE 156* 03/21/2015   CHOL 183 11/17/2014   TRIG 75.0 11/17/2014   HDL 74.20 11/17/2014   LDLCALC 94 11/17/2014   ALT 12 03/21/2015   AST 15 03/21/2015   NA 140 03/21/2015   K 4.3 03/21/2015   CL 106 03/21/2015   CREATININE 0.77 03/21/2015   BUN 22 03/21/2015   CO2 28 03/21/2015   TSH 1.43 03/21/2015   HGBA1C 7.5* 03/21/2015   MICROALBUR 0.6 12/20/2013    Mm Digital Screening Bilateral  11/17/2014  CLINICAL DATA:  Screening. EXAM: DIGITAL SCREENING BILATERAL MAMMOGRAM WITH CAD COMPARISON:  Previous exam(s). ACR Breast Density Category b: There are scattered areas of fibroglandular density. FINDINGS: There are no findings suspicious for malignancy. Images were processed with CAD. IMPRESSION: No mammographic evidence of malignancy. A result letter of this screening mammogram will be mailed directly to the patient. RECOMMENDATION: Screening mammogram in one year. (Code:SM-B-01Y) BI-RADS CATEGORY  1: Negative. Electronically Signed   By: Claudie Revering M.D.   On: 11/17/2014 11:19       Assessment & Plan:   Problem List Items Addressed This Visit    Diabetes ([place]) - Primary    Discussed recent elevated a1c.  Discussed diet and exercise.  Discussed treatment options.  She declines other medication at this time.  Wants to work on diet and exercise and see if can lower sugars.  Follow met b and a1c.  Have her check blood sugars regularly.        Relevant Orders   Lipid panel   Hemoglobin A1c   Ambulatory referral to diabetic education   Essential hypertension, benign    Blood pressure under good control.  Continue same medication regimen.  Follow pressures.  Follow metabolic panel.        Relevant Orders   Comprehensive metabolic  panel   Myasthenia gravis ([place])    Has been stable.  Will have eyes checked.  Follow.        Obesity (BMI 30-39.9)    Discussed diet and exercise.            Einar Pheasant, MD

## 2015-04-10 NOTE — Progress Notes (Signed)
Pre-visit discussion using our clinic review tool. No additional management support is needed unless otherwise documented below in the visit note.  

## 2015-04-15 ENCOUNTER — Encounter: Payer: Self-pay | Admitting: Internal Medicine

## 2015-04-15 NOTE — Assessment & Plan Note (Signed)
Discussed recent elevated a1c.  Discussed diet and exercise.  Discussed treatment options.  She declines other medication at this time.  Wants to work on diet and exercise and see if can lower sugars.  Follow met b and a1c.  Have her check blood sugars regularly.

## 2015-04-15 NOTE — Assessment & Plan Note (Signed)
Discussed diet and exercise 

## 2015-04-15 NOTE — Assessment & Plan Note (Signed)
Has been stable.  Will have eyes checked.  Follow.

## 2015-04-15 NOTE — Assessment & Plan Note (Signed)
Blood pressure under good control.  Continue same medication regimen.  Follow pressures.  Follow metabolic panel.   

## 2015-04-15 NOTE — Addendum Note (Signed)
Addended by: Charm BargesSCOTT, Ladale Sherburn S on: 04/15/2015 04:25 PM   Modules accepted: Orders

## 2015-05-01 ENCOUNTER — Other Ambulatory Visit: Payer: Self-pay | Admitting: Internal Medicine

## 2015-06-28 ENCOUNTER — Other Ambulatory Visit (INDEPENDENT_AMBULATORY_CARE_PROVIDER_SITE_OTHER): Payer: BLUE CROSS/BLUE SHIELD

## 2015-06-28 DIAGNOSIS — E119 Type 2 diabetes mellitus without complications: Secondary | ICD-10-CM | POA: Diagnosis not present

## 2015-06-28 DIAGNOSIS — I10 Essential (primary) hypertension: Secondary | ICD-10-CM

## 2015-06-28 LAB — COMPREHENSIVE METABOLIC PANEL
ALT: 13 U/L (ref 0–35)
AST: 17 U/L (ref 0–37)
Albumin: 4.3 g/dL (ref 3.5–5.2)
Alkaline Phosphatase: 49 U/L (ref 39–117)
BUN: 19 mg/dL (ref 6–23)
CO2: 27 mEq/L (ref 19–32)
Calcium: 9.6 mg/dL (ref 8.4–10.5)
Chloride: 106 mEq/L (ref 96–112)
Creatinine, Ser: 0.88 mg/dL (ref 0.40–1.20)
GFR: 84.6 mL/min (ref >60.00)
Glucose, Bld: 113 mg/dL — ABNORMAL HIGH (ref 70–99)
Potassium: 4.3 mEq/L (ref 3.5–5.1)
Sodium: 138 mEq/L (ref 135–145)
Total Bilirubin: 0.4 mg/dL (ref 0.2–1.2)
Total Protein: 7.3 g/dL (ref 6.0–8.3)

## 2015-06-28 LAB — LIPID PANEL
Cholesterol: 177 mg/dL (ref 0–200)
HDL: 58.7 mg/dL (ref >39.00)
LDL Cholesterol: 105 mg/dL — ABNORMAL HIGH (ref 0–99)
NonHDL: 118.36
Total CHOL/HDL Ratio: 3
Triglycerides: 67 mg/dL (ref 0.0–149.0)
VLDL: 13.4 mg/dL (ref 0.0–40.0)

## 2015-06-28 LAB — HEMOGLOBIN A1C: Hgb A1c MFr Bld: 7 % — ABNORMAL HIGH (ref 4.6–6.5)

## 2015-07-03 ENCOUNTER — Encounter: Payer: Self-pay | Admitting: Internal Medicine

## 2015-07-03 ENCOUNTER — Encounter (INDEPENDENT_AMBULATORY_CARE_PROVIDER_SITE_OTHER): Payer: Self-pay

## 2015-07-03 ENCOUNTER — Ambulatory Visit (INDEPENDENT_AMBULATORY_CARE_PROVIDER_SITE_OTHER): Payer: BLUE CROSS/BLUE SHIELD | Admitting: Internal Medicine

## 2015-07-03 VITALS — BP 126/70 | HR 81 | Temp 98.5°F | Resp 18 | Ht 69.25 in | Wt 275.0 lb

## 2015-07-03 DIAGNOSIS — I10 Essential (primary) hypertension: Secondary | ICD-10-CM | POA: Diagnosis not present

## 2015-07-03 DIAGNOSIS — E119 Type 2 diabetes mellitus without complications: Secondary | ICD-10-CM

## 2015-07-03 DIAGNOSIS — I471 Supraventricular tachycardia: Secondary | ICD-10-CM

## 2015-07-03 DIAGNOSIS — E669 Obesity, unspecified: Secondary | ICD-10-CM

## 2015-07-03 DIAGNOSIS — Z1239 Encounter for other screening for malignant neoplasm of breast: Secondary | ICD-10-CM | POA: Diagnosis not present

## 2015-07-03 NOTE — Progress Notes (Signed)
Patient ID: Amber Decker, female   DOB: 08-08-1955, 60 y.o.   MRN: [id number]   Subjective:    Patient ID: Amber Decker, female    DOB: August 01, 1955, 60 y.o.   MRN: [id number]  HPI  Patient here for a scheduled follow up.  Here to follow up on her sugars.  Recent labs - a1c decreased to 7.0.  She has been trying to do better with her diet.  We again discussed diet and exercise.  She is not exercising.  Discussed weight loss.  No chest pain.  No sob.  No acid reflux.  No abdominal pain or cramping.  Bowels stable.  Overall she feels she is doing relatively well.     Past Medical History  Diagnosis Date  . Hypertension   . Diabetes mellitus without complication ([facility])   . Myasthenia gravis ([facility])   . Herpes   . SVT (supraventricular tachycardia) [facility])    Past Surgical History  Procedure Laterality Date  . Uterine fibroid surgery     Family History  Problem Relation Age of Onset  . Diabetes Mother   . Alcohol abuse Father   . Heart disease Father     myocardial infarction  . Breast cancer Neg Hx   . Colon cancer Neg Hx    Social History   Social History  . Marital Status: Legally Separated    Spouse Name: N/A  . Number of Children: 0  . Years of Education: N/A   Occupational History  .  Other   Social History Main Topics  . Smoking status: Former Games developer  . Smokeless tobacco: Never Used  . Alcohol Use: No  . Drug Use: No  . Sexual Activity: Not Asked   Other Topics Concern  . None   Social History Narrative    Outpatient Encounter Prescriptions as of 07/03/2015  Medication Sig  . acyclovir (ZOVIRAX) 400 MG tablet TAKE 1 TABLET BY MOUTH EVERY DAY  . Biotin 1000 MCG tablet Take 1,000 mcg by mouth daily.  . cholecalciferol (VITAMIN D) 1000 UNITS tablet Take 1,000 Units by mouth daily.  [name] lisinopril-hydrochlorothiazide (PRINZIDE,ZESTORETIC) 10-12.5 MG tablet Take 1 tablet by mouth daily.  . metFORMIN (GLUCOPHAGE) 1000 MG tablet Take 1 tablet (1,000 mg total) by  mouth 2 (two) times daily with a meal.  . metoprolol tartrate (LOPRESSOR) 25 MG tablet Take 1 tablet (25 mg total) by mouth 2 (two) times daily.  . Multiple Vitamins-Minerals (ALIVE WOMENS 50+ PO) Take by mouth daily.  . Naphazoline-Polyethyl Glycol (EYE DROPS) 0.012-0.2 % SOLN Apply to eye.  . ranitidine (ZANTAC) 150 MG tablet Take 150 mg by mouth as needed for heartburn.   No facility-administered encounter medications on file as of 07/03/2015.    Review of Systems  Constitutional: Negative for appetite change and unexpected weight change.  HENT: Negative for congestion and sinus pressure.   Respiratory: Negative for cough, chest tightness and shortness of breath.   Cardiovascular: Negative for chest pain, palpitations and leg swelling.  Gastrointestinal: Negative for nausea, vomiting, abdominal pain and diarrhea.  Genitourinary: Negative for dysuria and difficulty urinating.  Musculoskeletal: Negative for back pain and joint swelling.  Skin: Negative for color change and rash.  Neurological: Negative for dizziness, light-headedness and headaches.  Psychiatric/Behavioral: Negative for dysphoric mood and agitation.       Objective:     Blood pressure rechecked by me:  122/74  Physical Exam  Constitutional: She appears well-developed and well-nourished. No distress.  HENT:  Nose:  Nose normal.  Mouth/Throat: Oropharynx is clear and moist.  Neck: Neck supple. No thyromegaly present.  Cardiovascular: Normal rate and regular rhythm.   Pulmonary/Chest: Breath sounds normal. No respiratory distress. She has no wheezes.  Abdominal: Soft. Bowel sounds are normal. There is no tenderness.  Musculoskeletal: She exhibits no edema or tenderness.  Lymphadenopathy:    She has no cervical adenopathy.  Skin: No rash noted. No erythema.  Psychiatric: She has a normal mood and affect. Her behavior is normal.    BP 126/70 mmHg  Pulse 81  Temp(Src) 98.5 F (36.9 C) (Oral)  Resp 18  Ht 5'  9.25" (1.759 m)  Wt 275 lb (124.739 kg)  BMI 40.32 kg/m2  SpO2 96%  LMP 02/21/2012 Wt Readings from Last 3 Encounters:  07/03/15 275 lb (124.739 kg)  04/10/15 275 lb 8 oz (124.966 kg)  03/21/15 276 lb 12 oz (125.533 kg)     Lab Results  Component Value Date   WBC 4.6 03/21/2015   HGB 13.0 03/21/2015   HCT 39.3 03/21/2015   PLT 172.0 03/21/2015   GLUCOSE 113* 06/28/2015   CHOL 177 06/28/2015   TRIG 67.0 06/28/2015   HDL 58.70 06/28/2015   LDLCALC 105* 06/28/2015   ALT 13 06/28/2015   AST 17 06/28/2015   NA 138 06/28/2015   K 4.3 06/28/2015   CL 106 06/28/2015   CREATININE 0.88 06/28/2015   BUN 19 06/28/2015   CO2 27 06/28/2015   TSH 1.43 03/21/2015   HGBA1C 7.0* 06/28/2015   MICROALBUR 0.6 12/20/2013    Mm Digital Screening Bilateral  11/17/2014  CLINICAL DATA:  Screening. EXAM: DIGITAL SCREENING BILATERAL MAMMOGRAM WITH CAD COMPARISON:  Previous exam(s). ACR Breast Density Category b: There are scattered areas of fibroglandular density. FINDINGS: There are no findings suspicious for malignancy. Images were processed with CAD. IMPRESSION: No mammographic evidence of malignancy. A result letter of this screening mammogram will be mailed directly to the patient. RECOMMENDATION: Screening mammogram in one year. (Code:SM-B-01Y) BI-RADS CATEGORY  1: Negative. Electronically Signed   By: Claudie Revering M.D.   On: 11/17/2014 11:19       Assessment & Plan:   Problem List Items Addressed This Visit    Diabetes ([place])    a1c on recent check improved.  Discussed diet and exercise.  Weight loss.  Follow met b and a1c.        Relevant Orders   Comprehensive metabolic panel   Hemoglobin A1c   Microalbumin / creatinine urine ratio   Lipid panel   Essential hypertension, benign    Blood pressure under good control.  Continue same medication regimen.  Follow pressures.  Follow metabolic panel.        Obesity (BMI 30-39.9)    Diet and exercise.  Follow.       SVT  (supraventricular tachycardia) ([facility])    Previous stress test negative.  On metoprolol.  Doing well.  Follow.         Other Visit Diagnoses    Screening breast examination    -  Primary    Relevant Orders    MM DIGITAL SCREENING BILATERAL        Einar Pheasant, MD

## 2015-07-03 NOTE — Progress Notes (Signed)
Pre-visit discussion using our clinic review tool. No additional management support is needed unless otherwise documented below in the visit note.  

## 2015-07-08 ENCOUNTER — Encounter: Payer: Self-pay | Admitting: Internal Medicine

## 2015-07-08 NOTE — Assessment & Plan Note (Signed)
Blood pressure under good control.  Continue same medication regimen.  Follow pressures.  Follow metabolic panel.   

## 2015-07-08 NOTE — Assessment & Plan Note (Signed)
a1c on recent check improved.  Discussed diet and exercise.  Weight loss.  Follow met b and a1c.

## 2015-07-08 NOTE — Assessment & Plan Note (Signed)
Diet and exercise.  Follow.  

## 2015-07-08 NOTE — Assessment & Plan Note (Signed)
Previous stress test negative.  On metoprolol.  Doing well.  Follow.

## 2015-07-24 ENCOUNTER — Ambulatory Visit: Payer: BLUE CROSS/BLUE SHIELD | Admitting: Internal Medicine

## 2015-09-28 ENCOUNTER — Other Ambulatory Visit: Payer: Self-pay | Admitting: Internal Medicine

## 2015-10-02 ENCOUNTER — Other Ambulatory Visit: Payer: BLUE CROSS/BLUE SHIELD

## 2015-10-05 ENCOUNTER — Ambulatory Visit: Payer: BLUE CROSS/BLUE SHIELD | Admitting: Internal Medicine

## 2015-10-09 ENCOUNTER — Other Ambulatory Visit (INDEPENDENT_AMBULATORY_CARE_PROVIDER_SITE_OTHER): Payer: BLUE CROSS/BLUE SHIELD

## 2015-10-09 DIAGNOSIS — E119 Type 2 diabetes mellitus without complications: Secondary | ICD-10-CM | POA: Diagnosis not present

## 2015-10-09 LAB — HEMOGLOBIN A1C: Hgb A1c MFr Bld: 7.1 % — ABNORMAL HIGH (ref 4.6–6.5)

## 2015-10-09 LAB — COMPREHENSIVE METABOLIC PANEL
ALT: 11 U/L (ref 0–35)
AST: 16 U/L (ref 0–37)
Albumin: 4.2 g/dL (ref 3.5–5.2)
Alkaline Phosphatase: 49 U/L (ref 39–117)
BUN: 18 mg/dL (ref 6–23)
CO2: 29 mEq/L (ref 19–32)
Calcium: 9.2 mg/dL (ref 8.4–10.5)
Chloride: 104 mEq/L (ref 96–112)
Creatinine, Ser: 0.88 mg/dL (ref 0.40–1.20)
GFR: 84.52 mL/min (ref >60.00)
Glucose, Bld: 156 mg/dL — ABNORMAL HIGH (ref 70–99)
Potassium: 4.3 mEq/L (ref 3.5–5.1)
Sodium: 139 mEq/L (ref 135–145)
Total Bilirubin: 0.4 mg/dL (ref 0.2–1.2)
Total Protein: 7.3 g/dL (ref 6.0–8.3)

## 2015-10-09 LAB — LIPID PANEL
Cholesterol: 192 mg/dL (ref 0–200)
HDL: 68.1 mg/dL (ref >39.00)
LDL Cholesterol: 111 mg/dL — ABNORMAL HIGH (ref 0–99)
NonHDL: 123.72
Total CHOL/HDL Ratio: 3
Triglycerides: 65 mg/dL (ref 0.0–149.0)
VLDL: 13 mg/dL (ref 0.0–40.0)

## 2015-10-09 LAB — MICROALBUMIN / CREATININE URINE RATIO
Creatinine,U: 209.5 mg/dL
Microalb Creat Ratio: 0.3 mg/g (ref 0.0–30.0)
Microalb, Ur: 0.7 mg/dL (ref 0.0–1.9)

## 2015-10-11 ENCOUNTER — Ambulatory Visit: Payer: BLUE CROSS/BLUE SHIELD | Admitting: Internal Medicine

## 2015-10-11 ENCOUNTER — Telehealth: Payer: Self-pay | Admitting: Internal Medicine

## 2015-10-11 NOTE — Telephone Encounter (Signed)
Ok to cancel appt
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
## 2015-10-11 NOTE — Telephone Encounter (Signed)
See below , thanks

## 2015-10-11 NOTE — Telephone Encounter (Signed)
Ok. Appt was cancelled. Thank you! °

## 2015-10-11 NOTE — Telephone Encounter (Signed)
Please advise, thanks.

## 2015-10-30 ENCOUNTER — Telehealth: Payer: Self-pay | Admitting: Internal Medicine

## 2015-10-30 NOTE — Telephone Encounter (Signed)
Pt called requesting a copy of her last labs. [id number] [facility] [place] [phone]. Thank you!  Call pt @ [phone]

## 2015-10-30 NOTE — Telephone Encounter (Signed)
Printed and mailed, thanks!

## 2015-11-05 NOTE — Telephone Encounter (Signed)
This was mailed last week, I can resend if she likes?

## 2015-11-05 NOTE — Telephone Encounter (Signed)
Please advise 

## 2015-11-05 NOTE — Telephone Encounter (Signed)
Pt stated she'd like it re-mailed. If not received this time she was told we'd would put a copy upfront to let us know so we could put up front.

## 2015-11-05 NOTE — Telephone Encounter (Signed)
Patient stated that she did not receive the copy of her labs

## 2015-11-19 ENCOUNTER — Ambulatory Visit
Admission: RE | Admit: 2015-11-19 | Discharge: 2015-11-19 | Disposition: A | Discharge status: Home / Self Care | Payer: BLUE CROSS/BLUE SHIELD | Source: Ambulatory Visit | Attending: Internal Medicine | Admitting: Internal Medicine

## 2015-11-19 DIAGNOSIS — Z1231 Encounter for screening mammogram for malignant neoplasm of breast: Secondary | ICD-10-CM | POA: Diagnosis not present

## 2015-11-19 DIAGNOSIS — Z1239 Encounter for other screening for malignant neoplasm of breast: Secondary | ICD-10-CM

## 2015-12-24 ENCOUNTER — Other Ambulatory Visit: Payer: Self-pay | Admitting: Internal Medicine

## 2015-12-31 ENCOUNTER — Encounter: Payer: Self-pay | Admitting: Internal Medicine

## 2015-12-31 ENCOUNTER — Ambulatory Visit (INDEPENDENT_AMBULATORY_CARE_PROVIDER_SITE_OTHER): Payer: BLUE CROSS/BLUE SHIELD | Admitting: Internal Medicine

## 2015-12-31 DIAGNOSIS — Z23 Encounter for immunization: Secondary | ICD-10-CM

## 2015-12-31 DIAGNOSIS — I471 Supraventricular tachycardia, unspecified: Secondary | ICD-10-CM

## 2015-12-31 DIAGNOSIS — E119 Type 2 diabetes mellitus without complications: Secondary | ICD-10-CM | POA: Diagnosis not present

## 2015-12-31 DIAGNOSIS — G7000 Myasthenia gravis without (acute) exacerbation: Secondary | ICD-10-CM

## 2015-12-31 DIAGNOSIS — I10 Essential (primary) hypertension: Secondary | ICD-10-CM | POA: Diagnosis not present

## 2015-12-31 DIAGNOSIS — E669 Obesity, unspecified: Secondary | ICD-10-CM

## 2015-12-31 MED ORDER — ACYCLOVIR 400 MG PO TABS: 400.0000 mg | ORAL_TABLET | Freq: Every day | ORAL | 0 refills | Status: DC

## 2015-12-31 MED ORDER — METFORMIN HCL 1000 MG PO TABS: 1000.0000 mg | ORAL_TABLET | Freq: Two times a day (BID) | ORAL | 3 refills | Status: DC

## 2015-12-31 MED ORDER — LISINOPRIL-HYDROCHLOROTHIAZIDE 10-12.5 MG PO TABS: 1.0000 | ORAL_TABLET | Freq: Every day | ORAL | 3 refills | Status: DC

## 2015-12-31 MED ORDER — METOPROLOL TARTRATE 25 MG PO TABS: 25.0000 mg | ORAL_TABLET | Freq: Two times a day (BID) | ORAL | 3 refills | Status: DC

## 2015-12-31 NOTE — Progress Notes (Signed)
Patient ID: Amber Decker, female   DOB: 1955-11-16, 60 y.o.   MRN: [id number]   Subjective:    Patient ID: Amber Decker, female    DOB: Dec 19, 1955, 60 y.o.   MRN: [id number]  HPI  Patient here for a scheduled follow up.  She is doing well.  Feels good.  Trying to stay active.  Discussed exercise and diet.  She reports her am blood sugars are averaging <128.  No chest pain.  No increased heart rate or palpitations.  Breathing stable.  No nausea or vomiting.  Bowels stable.     Past Medical History:  Diagnosis Date  . Diabetes mellitus without complication ([place])   . Herpes   . Hypertension   . Myasthenia gravis ([place])   . SVT (supraventricular tachycardia) ([facility])    Past Surgical History:  Procedure Laterality Date  . UTERINE FIBROID SURGERY     Family History  Problem Relation Age of Onset  . Diabetes Mother   . Alcohol abuse Father   . Heart disease Father     myocardial infarction  . Breast cancer Neg Hx   . Colon cancer Neg Hx    Social History   Social History  . Marital status: Divorced    Spouse name: N/A  . Number of children: 0  . Years of education: N/A   Occupational History  .  Other   Social History Main Topics  . Smoking status: Former Research scientist (life sciences)  . Smokeless tobacco: Never Used  . Alcohol use No  . Drug use: No  . Sexual activity: Not Asked   Other Topics Concern  . None   Social History Narrative  . None    Outpatient Encounter Prescriptions as of 12/31/2015  Medication Sig  . acyclovir (ZOVIRAX) 400 MG tablet Take 1 tablet (400 mg total) by mouth daily.  . Biotin 1000 MCG tablet Take 1,000 mcg by mouth daily.  . cholecalciferol (VITAMIN D) 1000 UNITS tablet Take 1,000 Units by mouth daily.  [name] lisinopril-hydrochlorothiazide (PRINZIDE,ZESTORETIC) 10-12.5 MG tablet TAKE ONE TABLET BY MOUTH ONCE DAILY  . lisinopril-hydrochlorothiazide (PRINZIDE,ZESTORETIC) 10-12.5 MG tablet Take 1 tablet by mouth daily.  . metFORMIN (GLUCOPHAGE) 1000 MG tablet  Take 1 tablet (1,000 mg total) by mouth 2 (two) times daily with a meal.  . metoprolol tartrate (LOPRESSOR) 25 MG tablet Take 1 tablet (25 mg total) by mouth 2 (two) times daily.  . Multiple Vitamins-Minerals (ALIVE WOMENS 50+ PO) Take by mouth daily.  . Naphazoline-Polyethyl Glycol (EYE DROPS) 0.012-0.2 % SOLN Apply to eye.  . ranitidine (ZANTAC) 150 MG tablet Take 150 mg by mouth as needed for heartburn.  . [DISCONTINUED] acyclovir (ZOVIRAX) 400 MG tablet TAKE 1 TABLET BY MOUTH EVERY DAY  . [DISCONTINUED] lisinopril-hydrochlorothiazide (PRINZIDE,ZESTORETIC) 10-12.5 MG tablet Take 1 tablet by mouth daily.  . [DISCONTINUED] metFORMIN (GLUCOPHAGE) 1000 MG tablet TAKE ONE TABLET BY MOUTH TWICE DAILY WITH MEALS  . [DISCONTINUED] metoprolol tartrate (LOPRESSOR) 25 MG tablet Take 1 tablet (25 mg total) by mouth 2 (two) times daily.  . [DISCONTINUED] metoprolol tartrate (LOPRESSOR) 25 MG tablet TAKE ONE TABLET BY MOUTH TWICE DAILY   No facility-administered encounter medications on file as of 12/31/2015.     Review of Systems  Constitutional: Negative for appetite change and unexpected weight change.  HENT: Negative for congestion and sinus pressure.   Respiratory: Negative for cough, chest tightness and shortness of breath.   Cardiovascular: Negative for chest pain, palpitations and leg swelling.  Gastrointestinal: Negative for abdominal  pain, diarrhea, nausea and vomiting.  Genitourinary: Negative for difficulty urinating and dysuria.  Musculoskeletal: Negative for back pain and joint swelling.  Skin: Negative for color change and rash.  Neurological: Negative for dizziness, light-headedness and headaches.  Psychiatric/Behavioral: Negative for agitation and dysphoric mood.       Objective:     Blood pressure rechecked by me:  124/78  Physical Exam  Constitutional: She appears well-developed and well-nourished. No distress.  HENT:  Nose: Nose normal.  Mouth/Throat: Oropharynx is clear  and moist.  Neck: Neck supple. No thyromegaly present.  Cardiovascular: Normal rate and regular rhythm.   Pulmonary/Chest: Breath sounds normal. No respiratory distress. She has no wheezes.  Abdominal: Soft. Bowel sounds are normal. There is no tenderness.  Musculoskeletal: She exhibits no edema or tenderness.  Lymphadenopathy:    She has no cervical adenopathy.  Skin: No rash noted. No erythema.  Psychiatric: She has a normal mood and affect. Her behavior is normal.    BP 122/80   Pulse 60   Temp 98 F (36.7 C) (Oral)   Ht _0  (1.753 m)   Wt 279 lb 6.4 oz (126.7 kg)   LMP 02/21/2012   SpO2 97%   BMI 41.26 kg/m  Wt Readings from Last 3 Encounters:  12/31/15 279 lb 6.4 oz (126.7 kg)  07/03/15 275 lb (124.7 kg)  04/10/15 275 lb 8 oz (125 kg)     Lab Results  Component Value Date   WBC 4.6 03/21/2015   HGB 13.0 03/21/2015   HCT 39.3 03/21/2015   PLT 172.0 03/21/2015   GLUCOSE 156 (H) 10/09/2015   CHOL 192 10/09/2015   TRIG 65.0 10/09/2015   HDL 68.10 10/09/2015   LDLCALC 111 (H) 10/09/2015   ALT 11 10/09/2015   AST 16 10/09/2015   NA 139 10/09/2015   K 4.3 10/09/2015   CL 104 10/09/2015   CREATININE 0.88 10/09/2015   BUN 18 10/09/2015   CO2 29 10/09/2015   TSH 1.43 03/21/2015   HGBA1C 7.1 (H) 10/09/2015   MICROALBUR 0.7 10/09/2015    Mm Digital Screening Bilateral  Result Date: 11/20/2015 CLINICAL DATA:  Screening. EXAM: DIGITAL SCREENING BILATERAL MAMMOGRAM WITH CAD COMPARISON:  Previous exam(s). ACR Breast Density Category b: There are scattered areas of fibroglandular density. FINDINGS: There are no findings suspicious for malignancy. Images were processed with CAD. IMPRESSION: No mammographic evidence of malignancy. A result letter of this screening mammogram will be mailed directly to the patient. RECOMMENDATION: Screening mammogram in one year. (Code:SM-B-01Y) BI-RADS CATEGORY  1: Negative. Electronically Signed   By: Ammie Ferrier M.D.   On:  11/20/2015 08:26       Assessment & Plan:   Problem List Items Addressed This Visit    Diabetes ([place])    Low carb diet and exercise.  Follow met b and a1c.  On metformin.  Lab Results  Component Value Date   HGBA1C 7.1 (H) 10/09/2015        Relevant Medications   lisinopril-hydrochlorothiazide (PRINZIDE,ZESTORETIC) 10-12.5 MG tablet   metFORMIN (GLUCOPHAGE) 1000 MG tablet   Other Relevant Orders   Lipid panel   Hepatic function panel   Hemoglobin R9X   Basic metabolic panel   Essential hypertension, benign    Blood pressure under good control.  Continue same medication regimen.  Follow pressures.  Follow metabolic panel.        Relevant Medications   lisinopril-hydrochlorothiazide (PRINZIDE,ZESTORETIC) 10-12.5 MG tablet   metoprolol tartrate (LOPRESSOR) 25 MG tablet  Myasthenia gravis ([place])    Stable.        Obesity (BMI 30-39.9)    Diet and exercise.        Relevant Medications   metFORMIN (GLUCOPHAGE) 1000 MG tablet   SVT (supraventricular tachycardia) ([facility])    On metoprolol.  Doing well.  Previous stress test negative.        Relevant Medications   lisinopril-hydrochlorothiazide (PRINZIDE,ZESTORETIC) 10-12.5 MG tablet   metoprolol tartrate (LOPRESSOR) 25 MG tablet    Other Visit Diagnoses    Encounter for immunization       Relevant Medications   lisinopril-hydrochlorothiazide (PRINZIDE,ZESTORETIC) 10-12.5 MG tablet   metFORMIN (GLUCOPHAGE) 1000 MG tablet   metoprolol tartrate (LOPRESSOR) 25 MG tablet   acyclovir (ZOVIRAX) 400 MG tablet   Other Relevant Orders   Flu Vaccine QUAD 36+ mos IM (Completed)       Einar Pheasant, MD

## 2015-12-31 NOTE — Progress Notes (Signed)
Pre visit review using our clinic review tool, if applicable. No additional management support is needed unless otherwise documented below in the visit note. 

## 2016-01-06 ENCOUNTER — Encounter: Payer: Self-pay | Admitting: Internal Medicine

## 2016-01-06 NOTE — Assessment & Plan Note (Signed)
Stable

## 2016-01-06 NOTE — Assessment & Plan Note (Signed)
Blood pressure under good control.  Continue same medication regimen.  Follow pressures.  Follow metabolic panel.   

## 2016-01-06 NOTE — Assessment & Plan Note (Addendum)
Low carb diet and exercise.  Follow met b and a1c.  On metformin.  Lab Results  Component Value Date   HGBA1C 7.1 (H) 10/09/2015

## 2016-01-06 NOTE — Assessment & Plan Note (Signed)
On metoprolol.  Doing well.  Previous stress test negative.

## 2016-01-06 NOTE — Assessment & Plan Note (Signed)
Diet and exercise.   

## 2016-03-05 ENCOUNTER — Other Ambulatory Visit: Payer: BLUE CROSS/BLUE SHIELD

## 2016-04-08 ENCOUNTER — Other Ambulatory Visit (INDEPENDENT_AMBULATORY_CARE_PROVIDER_SITE_OTHER): Payer: BLUE CROSS/BLUE SHIELD

## 2016-04-08 DIAGNOSIS — E119 Type 2 diabetes mellitus without complications: Secondary | ICD-10-CM | POA: Diagnosis not present

## 2016-04-08 LAB — BASIC METABOLIC PANEL
BUN: 20 mg/dL (ref 6–23)
CO2: 28 mEq/L (ref 19–32)
Calcium: 9.6 mg/dL (ref 8.4–10.5)
Chloride: 105 mEq/L (ref 96–112)
Creatinine, Ser: 0.84 mg/dL (ref 0.40–1.20)
GFR: 89.03 mL/min (ref >60.00)
Glucose, Bld: 144 mg/dL — ABNORMAL HIGH (ref 70–99)
Potassium: 4.5 mEq/L (ref 3.5–5.1)
Sodium: 140 mEq/L (ref 135–145)

## 2016-04-08 LAB — LIPID PANEL
Cholesterol: 172 mg/dL (ref 0–200)
HDL: 65.8 mg/dL (ref >39.00)
LDL Cholesterol: 90 mg/dL (ref 0–99)
NonHDL: 106.13
Total CHOL/HDL Ratio: 3
Triglycerides: 79 mg/dL (ref 0.0–149.0)
VLDL: 15.8 mg/dL (ref 0.0–40.0)

## 2016-04-08 LAB — HEPATIC FUNCTION PANEL
ALT: 14 U/L (ref 0–35)
AST: 16 U/L (ref 0–37)
Albumin: 4.2 g/dL (ref 3.5–5.2)
Alkaline Phosphatase: 47 U/L (ref 39–117)
Bilirubin, Direct: 0.1 mg/dL (ref 0.0–0.3)
Total Bilirubin: 0.4 mg/dL (ref 0.2–1.2)
Total Protein: 6.9 g/dL (ref 6.0–8.3)

## 2016-04-08 LAB — HEMOGLOBIN A1C: Hgb A1c MFr Bld: 7.7 % — ABNORMAL HIGH (ref 4.6–6.5)

## 2016-04-15 ENCOUNTER — Encounter: Payer: Self-pay | Admitting: Internal Medicine

## 2016-04-15 ENCOUNTER — Ambulatory Visit (INDEPENDENT_AMBULATORY_CARE_PROVIDER_SITE_OTHER): Payer: BLUE CROSS/BLUE SHIELD | Admitting: Internal Medicine

## 2016-04-15 VITALS — BP 128/82 | HR 68 | Temp 98.6°F | Resp 16 | Ht 69.0 in | Wt 277.0 lb

## 2016-04-15 DIAGNOSIS — Z124 Encounter for screening for malignant neoplasm of cervix: Secondary | ICD-10-CM | POA: Diagnosis not present

## 2016-04-15 DIAGNOSIS — I471 Supraventricular tachycardia: Secondary | ICD-10-CM | POA: Diagnosis not present

## 2016-04-15 DIAGNOSIS — Z0000 Encounter for general adult medical examination without abnormal findings: Secondary | ICD-10-CM | POA: Diagnosis not present

## 2016-04-15 DIAGNOSIS — E669 Obesity, unspecified: Secondary | ICD-10-CM | POA: Diagnosis not present

## 2016-04-15 DIAGNOSIS — I10 Essential (primary) hypertension: Secondary | ICD-10-CM | POA: Diagnosis not present

## 2016-04-15 DIAGNOSIS — G7000 Myasthenia gravis without (acute) exacerbation: Secondary | ICD-10-CM | POA: Diagnosis not present

## 2016-04-15 DIAGNOSIS — E119 Type 2 diabetes mellitus without complications: Secondary | ICD-10-CM | POA: Diagnosis not present

## 2016-04-15 NOTE — Progress Notes (Signed)
Pre-visit discussion using our clinic review tool. No additional management support is needed unless otherwise documented below in the visit note.  

## 2016-04-15 NOTE — Progress Notes (Signed)
Patient ID: Amber Decker, female   DOB: 03/22/1955, 61 y.o.   MRN: [id number]   Subjective:    Patient ID: Amber Decker, female    DOB: 12/06/1955, 61 y.o.   MRN: [id number]  HPI  Patient here for her physical exam.  She reports she is doing relatively well.  Has tried to adjust her diet.  a1c elevated.  Discussed lab results.  She is not exercising.  Discussed referral to a nutritionist at [facility].  She agrees.  Discussed medication adjustment and adding medication.  She declines to add medication.  States she can get down with diet and exercise.  No chest pain.  No sob.  No acid reflux.  No abdominal pain.  Bowel stable.      Past Medical History:  Diagnosis Date  . Diabetes mellitus without complication (HCC)   . Herpes   . Hypertension   . Myasthenia gravis (HCC)   . SVT (supraventricular tachycardia) (HCC)    Past Surgical History:  Procedure Laterality Date  . UTERINE FIBROID SURGERY     Family History  Problem Relation Age of Onset  . Diabetes Mother   . Alcohol abuse Father   . Heart disease Father     myocardial infarction  . Breast cancer Neg Hx   . Colon cancer Neg Hx    Social History   Social History  . Marital status: Divorced    Spouse name: N/A  . Number of children: 0  . Years of education: N/A   Occupational History  .  Other   Social History Main Topics  . Smoking status: Former Games developer  . Smokeless tobacco: Never Used  . Alcohol use No  . Drug use: No  . Sexual activity: Not Asked   Other Topics Concern  . None   Social History Narrative  . None    Outpatient Encounter Prescriptions as of 04/15/2016  Medication Sig  . acyclovir (ZOVIRAX) 400 MG tablet Take 1 tablet (400 mg total) by mouth daily.  . Biotin 1000 MCG tablet Take 1,000 mcg by mouth daily.  . cholecalciferol (VITAMIN D) 1000 UNITS tablet Take 1,000 Units by mouth daily.  [name] lisinopril-hydrochlorothiazide (PRINZIDE,ZESTORETIC) 10-12.5 MG tablet TAKE ONE TABLET BY MOUTH  ONCE DAILY  . lisinopril-hydrochlorothiazide (PRINZIDE,ZESTORETIC) 10-12.5 MG tablet Take 1 tablet by mouth daily.  . metFORMIN (GLUCOPHAGE) 1000 MG tablet Take 1 tablet (1,000 mg total) by mouth 2 (two) times daily with a meal.  . metoprolol tartrate (LOPRESSOR) 25 MG tablet Take 1 tablet (25 mg total) by mouth 2 (two) times daily.  . Multiple Vitamins-Minerals (ALIVE WOMENS 50+ PO) Take by mouth daily.  . Naphazoline-Polyethyl Glycol (EYE DROPS) 0.012-0.2 % SOLN Apply to eye.  . ranitidine (ZANTAC) 150 MG tablet Take 150 mg by mouth as needed for heartburn.   No facility-administered encounter medications on file as of 04/15/2016.     Review of Systems  Constitutional: Negative for appetite change and unexpected weight change.  HENT: Negative for congestion and sinus pressure.   Eyes: Negative for pain and visual disturbance.  Respiratory: Negative for cough, chest tightness and shortness of breath.   Cardiovascular: Negative for chest pain, palpitations and leg swelling.  Gastrointestinal: Negative for abdominal pain, diarrhea, nausea and vomiting.  Genitourinary: Negative for difficulty urinating and dysuria.  Musculoskeletal: Negative for back pain and joint swelling.  Skin: Negative for color change and rash.  Neurological: Negative for dizziness, light-headedness and headaches.  Hematological: Negative for adenopathy. Does not bruise/bleed  easily.  Psychiatric/Behavioral: Negative for agitation and dysphoric mood.       Objective:     Blood pressure rechecked by me:  120/72  Physical Exam  Constitutional: She is oriented to person, place, and time. She appears well-developed and well-nourished. No distress.  HENT:  Nose: Nose normal.  Mouth/Throat: Oropharynx is clear and moist.  Eyes: Right eye exhibits no discharge. Left eye exhibits no discharge. No scleral icterus.  Neck: Neck supple. No thyromegaly present.  Cardiovascular: Normal rate and regular rhythm.     Pulmonary/Chest: Breath sounds normal. No accessory muscle usage. No tachypnea. No respiratory distress. She has no decreased breath sounds. She has no wheezes. She has no rhonchi. Right breast exhibits no inverted nipple, no mass, no nipple discharge and no tenderness (no axillary adenopathy). Left breast exhibits no inverted nipple, no mass, no nipple discharge and no tenderness (no axilarry adenopathy).  Abdominal: Soft. Bowel sounds are normal. There is no tenderness.  Genitourinary:  Genitourinary Comments: Normal external genitalia.  Vaginal vault without lesions.  Cervix identified.  Pap smear performed.  Stenotic os. Could not appreciate any adnexal masses or tenderness.    Musculoskeletal: She exhibits no edema or tenderness.  Lymphadenopathy:    She has no cervical adenopathy.  Neurological: She is alert and oriented to person, place, and time.  Skin: Skin is warm. No rash noted. No erythema.  Psychiatric: She has a normal mood and affect. Her behavior is normal.    BP 128/82 (BP Location: Left Arm, Patient Position: Sitting, Cuff Size: Large)   Pulse 68   Temp 98.6 F (37 C) (Oral)   Resp 16   Ht 5\' 9"  (1.753 m)   Wt 277 lb (125.6 kg)   LMP 02/21/2012   SpO2 97%   BMI 40.91 kg/m  Wt Readings from Last 3 Encounters:  04/15/16 277 lb (125.6 kg)  12/31/15 279 lb 6.4 oz (126.7 kg)  07/03/15 275 lb (124.7 kg)     Lab Results  Component Value Date   WBC 4.6 03/21/2015   HGB 13.0 03/21/2015   HCT 39.3 03/21/2015   PLT 172.0 03/21/2015   GLUCOSE 144 (H) 04/08/2016   CHOL 172 04/08/2016   TRIG 79.0 04/08/2016   HDL 65.80 04/08/2016   LDLCALC 90 04/08/2016   ALT 14 04/08/2016   AST 16 04/08/2016   NA 140 04/08/2016   K 4.5 04/08/2016   CL 105 04/08/2016   CREATININE 0.84 04/08/2016   BUN 20 04/08/2016   CO2 28 04/08/2016   TSH 1.43 03/21/2015   HGBA1C 7.7 (H) 04/08/2016   MICROALBUR 0.7 10/09/2015    Mm Digital Screening Bilateral  Result Date:  11/20/2015 CLINICAL DATA:  Screening. EXAM: DIGITAL SCREENING BILATERAL MAMMOGRAM WITH CAD COMPARISON:  Previous exam(s). ACR Breast Density Category b: There are scattered areas of fibroglandular density. FINDINGS: There are no findings suspicious for malignancy. Images were processed with CAD. IMPRESSION: No mammographic evidence of malignancy. A result letter of this screening mammogram will be mailed directly to the patient. RECOMMENDATION: Screening mammogram in one year. (Code:SM-B-01Y) BI-RADS CATEGORY  1: Negative. Electronically Signed   By: Frederico HammanMichelle  Collins M.D.   On: 11/20/2015 08:26       Assessment & Plan:   Problem List Items Addressed This Visit    Diabetes (HCC)    Discussed diet and exercise.  Wanted to add medication.  Only on metformin.  She declined.  Wanted to work on diet and exercise.  Discussed importance of getting  her sugars under control.  Agrees to referral to nutritionist.  Follow.        Relevant Orders   Amb ref to Medical Nutrition Therapy-MNT   CBC with Differential/Platelet   TSH   Hepatic function panel   Lipid panel   Hemoglobin A1c   Basic metabolic panel   Essential hypertension, benign    Blood pressure under good control.  Continue same medication regimen.  Follow pressures.  Follow metabolic panel.        Health care maintenance    Physical today 04/15/16.  PAP 03/23/12 - negative with negative HPV.  PAP today.  Mammogram 11/20/15 - [facility] I.        Myasthenia gravis (HCC)    Stable.        Obesity (BMI 30-39.9)    Discussed diet and exercise.  Follow.  Refer to nutritionist.        SVT (supraventricular tachycardia) (HCC)    On metoprolol.  Doing well.  Follow.         Other Visit Diagnoses    Routine general medical examination at a health care facility    -  Primary   Pap smear for cervical cancer screening       Relevant Orders   Cytology - PAP (Completed)       Dale Ferndale, MD

## 2016-04-15 NOTE — Assessment & Plan Note (Signed)
Physical today 04/15/16.  PAP 03/23/12 - negative with negative HPV.  PAP today.  Mammogram 11/20/15 - Birads I.

## 2016-04-16 ENCOUNTER — Other Ambulatory Visit (HOSPITAL_COMMUNITY)
Admission: RE | Admit: 2016-04-16 | Discharge: 2016-04-16 | Disposition: A | Discharge status: Home / Self Care | Payer: BLUE CROSS/BLUE SHIELD | Source: Ambulatory Visit | Attending: Internal Medicine | Admitting: Internal Medicine

## 2016-04-16 DIAGNOSIS — Z01419 Encounter for gynecological examination (general) (routine) without abnormal findings: Secondary | ICD-10-CM | POA: Insufficient documentation

## 2016-04-16 DIAGNOSIS — Z1151 Encounter for screening for human papillomavirus (HPV): Secondary | ICD-10-CM | POA: Insufficient documentation

## 2016-04-18 ENCOUNTER — Other Ambulatory Visit: Payer: Self-pay | Admitting: Internal Medicine

## 2016-04-18 LAB — CYTOLOGY - PAP
Diagnosis: NEGATIVE
HPV: DETECTED — AB

## 2016-04-20 ENCOUNTER — Encounter: Payer: Self-pay | Admitting: Internal Medicine

## 2016-04-20 DIAGNOSIS — B977 Papillomavirus as the cause of diseases classified elsewhere: Secondary | ICD-10-CM | POA: Insufficient documentation

## 2016-04-20 NOTE — Assessment & Plan Note (Signed)
Blood pressure under good control.  Continue same medication regimen.  Follow pressures.  Follow metabolic panel.   

## 2016-04-20 NOTE — Assessment & Plan Note (Signed)
On metoprolol.  Doing well.  Follow.   

## 2016-04-20 NOTE — Assessment & Plan Note (Signed)
Discussed diet and exercise.  Wanted to add medication.  Only on metformin.  She declined.  Wanted to work on diet and exercise.  Discussed importance of getting her sugars under control.  Agrees to referral to nutritionist.  Follow.

## 2016-04-20 NOTE — Assessment & Plan Note (Signed)
Discussed diet and exercise.  Follow.  Refer to nutritionist.

## 2016-04-20 NOTE — Assessment & Plan Note (Signed)
Stable

## 2016-07-11 ENCOUNTER — Other Ambulatory Visit (INDEPENDENT_AMBULATORY_CARE_PROVIDER_SITE_OTHER): Payer: BLUE CROSS/BLUE SHIELD

## 2016-07-11 DIAGNOSIS — E119 Type 2 diabetes mellitus without complications: Secondary | ICD-10-CM | POA: Diagnosis not present

## 2016-07-11 LAB — HEPATIC FUNCTION PANEL
ALT: 11 U/L (ref 0–35)
AST: 14 U/L (ref 0–37)
Albumin: 4.3 g/dL (ref 3.5–5.2)
Alkaline Phosphatase: 46 U/L (ref 39–117)
Bilirubin, Direct: 0.1 mg/dL (ref 0.0–0.3)
Total Bilirubin: 0.4 mg/dL (ref 0.2–1.2)
Total Protein: 7.4 g/dL (ref 6.0–8.3)

## 2016-07-11 LAB — BASIC METABOLIC PANEL
BUN: 23 mg/dL (ref 6–23)
CO2: 28 mEq/L (ref 19–32)
Calcium: 9.5 mg/dL (ref 8.4–10.5)
Chloride: 105 mEq/L (ref 96–112)
Creatinine, Ser: 0.89 mg/dL (ref 0.40–1.20)
GFR: 83.21 mL/min (ref >60.00)
Glucose, Bld: 161 mg/dL — ABNORMAL HIGH (ref 70–99)
Potassium: 4.4 mEq/L (ref 3.5–5.1)
Sodium: 139 mEq/L (ref 135–145)

## 2016-07-11 LAB — CBC WITH DIFFERENTIAL/PLATELET
Basophils Absolute: 0 10*3/uL (ref 0.0–0.1)
Basophils Relative: 0.7 % (ref 0.0–3.0)
Eosinophils Absolute: 0.1 10*3/uL (ref 0.0–0.7)
Eosinophils Relative: 2.3 % (ref 0.0–5.0)
HCT: 38.9 % (ref 36.0–46.0)
Hemoglobin: 13.2 g/dL (ref 12.0–15.0)
Lymphocytes Relative: 53.6 % — ABNORMAL HIGH (ref 12.0–46.0)
Lymphs Abs: 1.8 10*3/uL (ref 0.7–4.0)
MCHC: 33.8 g/dL (ref 30.0–36.0)
MCV: 88.5 fl (ref 78.0–100.0)
Monocytes Absolute: 0.3 10*3/uL (ref 0.1–1.0)
Monocytes Relative: 9.4 % (ref 3.0–12.0)
Neutro Abs: 1.1 10*3/uL — ABNORMAL LOW (ref 1.4–7.7)
Neutrophils Relative %: 34 % — ABNORMAL LOW (ref 43.0–77.0)
Platelets: 186 10*3/uL (ref 150.0–400.0)
RBC: 4.4 Mil/uL (ref 3.87–5.11)
RDW: 14.2 % (ref 11.5–15.5)
WBC: 3.3 10*3/uL — ABNORMAL LOW (ref 4.0–10.5)

## 2016-07-11 LAB — LIPID PANEL
Cholesterol: 176 mg/dL (ref 0–200)
HDL: 70.6 mg/dL (ref >39.00)
LDL Cholesterol: 94 mg/dL (ref 0–99)
NonHDL: 105.29
Total CHOL/HDL Ratio: 2
Triglycerides: 56 mg/dL (ref 0.0–149.0)
VLDL: 11.2 mg/dL (ref 0.0–40.0)

## 2016-07-11 LAB — HEMOGLOBIN A1C: Hgb A1c MFr Bld: 7.5 % — ABNORMAL HIGH (ref 4.6–6.5)

## 2016-07-11 LAB — TSH: TSH: 2.26 u[IU]/mL (ref 0.35–4.50)

## 2016-07-18 ENCOUNTER — Other Ambulatory Visit: Payer: BLUE CROSS/BLUE SHIELD

## 2016-07-22 ENCOUNTER — Ambulatory Visit (INDEPENDENT_AMBULATORY_CARE_PROVIDER_SITE_OTHER): Payer: BLUE CROSS/BLUE SHIELD | Admitting: Internal Medicine

## 2016-07-22 ENCOUNTER — Encounter: Payer: Self-pay | Admitting: Internal Medicine

## 2016-07-22 VITALS — BP 118/64 | HR 65 | Temp 98.6°F | Resp 12 | Ht 69.0 in | Wt 280.2 lb

## 2016-07-22 DIAGNOSIS — I10 Essential (primary) hypertension: Secondary | ICD-10-CM

## 2016-07-22 DIAGNOSIS — E119 Type 2 diabetes mellitus without complications: Secondary | ICD-10-CM | POA: Diagnosis not present

## 2016-07-22 DIAGNOSIS — G7000 Myasthenia gravis without (acute) exacerbation: Secondary | ICD-10-CM

## 2016-07-22 DIAGNOSIS — I471 Supraventricular tachycardia: Secondary | ICD-10-CM | POA: Diagnosis not present

## 2016-07-22 DIAGNOSIS — D72819 Decreased white blood cell count, unspecified: Secondary | ICD-10-CM | POA: Diagnosis not present

## 2016-07-22 DIAGNOSIS — B977 Papillomavirus as the cause of diseases classified elsewhere: Secondary | ICD-10-CM

## 2016-07-22 NOTE — Progress Notes (Signed)
Patient ID: Amber Decker, female   DOB: 03-26-1955, 61 y.o.   MRN: [id number]   Subjective:    Patient ID: Amber Decker, female    DOB: 1955/02/15, 61 y.o.   MRN: [id number]  HPI  Patient here for a scheduled follow up.  She reports she has doing relatively well.  Discussed her lab results.  a1c elevated, but slightly improved when compared to the last check.  She has not been watching her diet as well.  Not exercising regularly.  Plans to get more serious about her exercise and diet.  No chest pain.  Breathing stable.  Palpitations stable/improved.  No acid reflux.  No abdominal pain.  Bowels moving.     Past Medical History:  Diagnosis Date  . Diabetes mellitus without complication ([place])   . Herpes   . Hypertension   . Myasthenia gravis ([place])   . SVT (supraventricular tachycardia) (HCC)    Past Surgical History:  Procedure Laterality Date  . UTERINE FIBROID SURGERY     Family History  Problem Relation Age of Onset  . Diabetes Mother   . Alcohol abuse Father   . Heart disease Father        myocardial infarction  . Breast cancer Neg Hx   . Colon cancer Neg Hx    Social History   Social History  . Marital status: Divorced    Spouse name: N/A  . Number of children: 0  . Years of education: N/A   Occupational History  .  Other   Social History Main Topics  . Smoking status: Former Research scientist (life sciences)  . Smokeless tobacco: Never Used  . Alcohol use No  . Drug use: No  . Sexual activity: Not Asked   Other Topics Concern  . None   Social History Narrative  . None    Outpatient Encounter Prescriptions as of 07/22/2016  Medication Sig  . acyclovir (ZOVIRAX) 400 MG tablet Take 1 tablet (400 mg total) by mouth daily.  . Biotin 1000 MCG tablet Take 1,000 mcg by mouth daily.  . cholecalciferol (VITAMIN D) 1000 UNITS tablet Take 1,000 Units by mouth daily.  [name] lisinopril-hydrochlorothiazide (PRINZIDE,ZESTORETIC) 10-12.5 MG tablet Take 1 tablet by mouth daily.  . metFORMIN  (GLUCOPHAGE) 1000 MG tablet Take 1 tablet (1,000 mg total) by mouth 2 (two) times daily with a meal.  . metoprolol tartrate (LOPRESSOR) 25 MG tablet Take 1 tablet (25 mg total) by mouth 2 (two) times daily.  . Multiple Vitamins-Minerals (ALIVE WOMENS 50+ PO) Take by mouth daily.  . Naphazoline-Polyethyl Glycol (EYE DROPS) 0.012-0.2 % SOLN Apply to eye.  . ranitidine (ZANTAC) 150 MG tablet Take 150 mg by mouth as needed for heartburn.  . [DISCONTINUED] acyclovir (ZOVIRAX) 400 MG tablet TAKE 1 TABLET BY MOUTH EVERY DAY  . [DISCONTINUED] lisinopril-hydrochlorothiazide (PRINZIDE,ZESTORETIC) 10-12.5 MG tablet TAKE ONE TABLET BY MOUTH ONCE DAILY   No facility-administered encounter medications on file as of 07/22/2016.     Review of Systems  Constitutional: Negative for appetite change and unexpected weight change.  HENT: Negative for congestion and sinus pressure.   Respiratory: Negative for cough, chest tightness and shortness of breath.   Cardiovascular: Negative for chest pain and leg swelling.  Gastrointestinal: Negative for abdominal pain, diarrhea, nausea and vomiting.  Genitourinary: Negative for difficulty urinating and dysuria.  Musculoskeletal: Negative for back pain and joint swelling.  Skin: Negative for color change and rash.  Neurological: Negative for dizziness, light-headedness and headaches.  Psychiatric/Behavioral: Negative for agitation and  dysphoric mood.       Objective:     Blood pressure rechecked by me:  122/72  Physical Exam  Constitutional: She appears well-developed and well-nourished. No distress.  HENT:  Nose: Nose normal.  Mouth/Throat: Oropharynx is clear and moist.  Neck: Neck supple. No thyromegaly present.  Cardiovascular: Normal rate and regular rhythm.   Pulmonary/Chest: Breath sounds normal. No respiratory distress. She has no wheezes.  Abdominal: Soft. Bowel sounds are normal. There is no tenderness.  Musculoskeletal: She exhibits no edema or  tenderness.  Lymphadenopathy:    She has no cervical adenopathy.  Skin: No rash noted. No erythema.  Psychiatric: She has a normal mood and affect. Her behavior is normal.    BP 118/64 (BP Location: Left Arm, Patient Position: Sitting, Cuff Size: Large)   Pulse 65   Temp 98.6 F (37 C) (Oral)   Resp 12   Ht _0  (1.753 m)   Wt 280 lb 3.2 oz (127.1 kg)   LMP 02/21/2012   SpO2 98%   BMI 41.38 kg/m  Wt Readings from Last 3 Encounters:  07/22/16 280 lb 3.2 oz (127.1 kg)  04/15/16 277 lb (125.6 kg)  12/31/15 279 lb 6.4 oz (126.7 kg)     Lab Results  Component Value Date   WBC 3.3 (L) 07/11/2016   HGB 13.2 07/11/2016   HCT 38.9 07/11/2016   PLT 186.0 07/11/2016   GLUCOSE 161 (H) 07/11/2016   CHOL 176 07/11/2016   TRIG 56.0 07/11/2016   HDL 70.60 07/11/2016   LDLCALC 94 07/11/2016   ALT 11 07/11/2016   AST 14 07/11/2016   NA 139 07/11/2016   K 4.4 07/11/2016   CL 105 07/11/2016   CREATININE 0.89 07/11/2016   BUN 23 07/11/2016   CO2 28 07/11/2016   TSH 2.26 07/11/2016   HGBA1C 7.5 (H) 07/11/2016   MICROALBUR 0.7 10/09/2015       Assessment & Plan:   Problem List Items Addressed This Visit    Diabetes ([place])    a1c 7.5.  Discussed diet and exercise.  She wants to hold on medication.  Wants to work on diet and exercise.  Follow met b and a1c.        Essential hypertension, benign    Blood pressure under good control.  Continue same medication regimen.  Follow pressures.  Follow metabolic panel.        HPV in female    PAP 04/15/16 - positive HPV.  Recheck pap with next visit.        Myasthenia gravis ([place])    Stable.       Severe obesity (BMI >= 40) (HCC)    Diet and exercise.  Follow.        SVT (supraventricular tachycardia) (HCC)    On metoprolol.  Doing well.  Follow.         Other Visit Diagnoses    Leukopenia, unspecified type    -  Primary   recheck cbc.     Relevant Orders   CBC with Differential/Platelet       Einar Pheasant, MD

## 2016-07-22 NOTE — Progress Notes (Signed)
Pre-visit discussion using our clinic review tool. No additional management support is needed unless otherwise documented below in the visit note.  

## 2016-07-24 ENCOUNTER — Encounter: Payer: Self-pay | Admitting: Internal Medicine

## 2016-07-24 DIAGNOSIS — Z6841 Body Mass Index (BMI) 40.0 and over, adult: Secondary | ICD-10-CM | POA: Insufficient documentation

## 2016-07-24 NOTE — Assessment & Plan Note (Signed)
Diet and exercise.  Follow.  

## 2016-07-24 NOTE — Assessment & Plan Note (Signed)
PAP 04/15/16 - positive HPV.  Recheck pap with next visit.

## 2016-07-24 NOTE — Assessment & Plan Note (Signed)
On metoprolol.  Doing well.  Follow.   

## 2016-07-24 NOTE — Assessment & Plan Note (Signed)
a1c 7.5.  Discussed diet and exercise.  She wants to hold on medication.  Wants to work on diet and exercise.  Follow met b and a1c.

## 2016-07-24 NOTE — Assessment & Plan Note (Signed)
Stable

## 2016-07-24 NOTE — Assessment & Plan Note (Signed)
Blood pressure under good control.  Continue same medication regimen.  Follow pressures.  Follow metabolic panel.   

## 2016-08-14 ENCOUNTER — Other Ambulatory Visit: Payer: Self-pay | Admitting: Internal Medicine

## 2016-10-22 ENCOUNTER — Other Ambulatory Visit: Payer: BLUE CROSS/BLUE SHIELD

## 2016-10-28 ENCOUNTER — Ambulatory Visit: Payer: BLUE CROSS/BLUE SHIELD | Admitting: Internal Medicine

## 2016-11-18 ENCOUNTER — Other Ambulatory Visit: Payer: Self-pay | Admitting: Internal Medicine

## 2016-11-18 DIAGNOSIS — Z1231 Encounter for screening mammogram for malignant neoplasm of breast: Secondary | ICD-10-CM

## 2016-12-02 ENCOUNTER — Ambulatory Visit
Admission: RE | Admit: 2016-12-02 | Discharge: 2016-12-02 | Disposition: A | Discharge status: Home / Self Care | Payer: BLUE CROSS/BLUE SHIELD | Source: Ambulatory Visit | Attending: Internal Medicine | Admitting: Internal Medicine

## 2016-12-02 DIAGNOSIS — Z1231 Encounter for screening mammogram for malignant neoplasm of breast: Secondary | ICD-10-CM | POA: Diagnosis not present

## 2016-12-02 DIAGNOSIS — R928 Other abnormal and inconclusive findings on diagnostic imaging of breast: Secondary | ICD-10-CM | POA: Diagnosis not present

## 2016-12-03 ENCOUNTER — Other Ambulatory Visit: Payer: Self-pay | Admitting: Internal Medicine

## 2016-12-03 DIAGNOSIS — R928 Other abnormal and inconclusive findings on diagnostic imaging of breast: Secondary | ICD-10-CM

## 2016-12-03 DIAGNOSIS — N6320 Unspecified lump in the left breast, unspecified quadrant: Secondary | ICD-10-CM

## 2016-12-17 ENCOUNTER — Other Ambulatory Visit (INDEPENDENT_AMBULATORY_CARE_PROVIDER_SITE_OTHER): Payer: BLUE CROSS/BLUE SHIELD

## 2016-12-17 DIAGNOSIS — D72819 Decreased white blood cell count, unspecified: Secondary | ICD-10-CM

## 2016-12-17 LAB — CBC WITH DIFFERENTIAL/PLATELET
Basophils Absolute: 0 10*3/uL (ref 0.0–0.1)
Basophils Relative: 1.2 % (ref 0.0–3.0)
Eosinophils Absolute: 0.1 10*3/uL (ref 0.0–0.7)
Eosinophils Relative: 2.2 % (ref 0.0–5.0)
HCT: 38.9 % (ref 36.0–46.0)
Hemoglobin: 13.2 g/dL (ref 12.0–15.0)
Lymphocytes Relative: 53 % — ABNORMAL HIGH (ref 12.0–46.0)
Lymphs Abs: 1.9 10*3/uL (ref 0.7–4.0)
MCHC: 34 g/dL (ref 30.0–36.0)
MCV: 89.9 fl (ref 78.0–100.0)
Monocytes Absolute: 0.4 10*3/uL (ref 0.1–1.0)
Monocytes Relative: 9.9 % (ref 3.0–12.0)
Neutro Abs: 1.2 10*3/uL — ABNORMAL LOW (ref 1.4–7.7)
Neutrophils Relative %: 33.7 % — ABNORMAL LOW (ref 43.0–77.0)
Platelets: 205 10*3/uL (ref 150.0–400.0)
RBC: 4.33 Mil/uL (ref 3.87–5.11)
RDW: 13.3 % (ref 11.5–15.5)
WBC: 3.6 10*3/uL — ABNORMAL LOW (ref 4.0–10.5)

## 2016-12-18 ENCOUNTER — Ambulatory Visit
Admission: RE | Admit: 2016-12-18 | Discharge: 2016-12-18 | Disposition: A | Discharge status: Home / Self Care | Payer: BLUE CROSS/BLUE SHIELD | Source: Ambulatory Visit | Attending: Internal Medicine | Admitting: Internal Medicine

## 2016-12-18 ENCOUNTER — Other Ambulatory Visit (HOSPITAL_COMMUNITY)
Admission: RE | Admit: 2016-12-18 | Discharge: 2016-12-18 | Disposition: A | Discharge status: Home / Self Care | Payer: BLUE CROSS/BLUE SHIELD | Source: Ambulatory Visit | Attending: Internal Medicine | Admitting: Internal Medicine

## 2016-12-18 ENCOUNTER — Encounter: Payer: Self-pay | Admitting: Internal Medicine

## 2016-12-18 ENCOUNTER — Ambulatory Visit (INDEPENDENT_AMBULATORY_CARE_PROVIDER_SITE_OTHER): Payer: BLUE CROSS/BLUE SHIELD | Admitting: Internal Medicine

## 2016-12-18 VITALS — BP 128/84 | HR 80 | Temp 97.9°F | Resp 18 | Ht 68.9 in | Wt 276.4 lb

## 2016-12-18 DIAGNOSIS — Z8249 Family history of ischemic heart disease and other diseases of the circulatory system: Secondary | ICD-10-CM | POA: Insufficient documentation

## 2016-12-18 DIAGNOSIS — I10 Essential (primary) hypertension: Secondary | ICD-10-CM

## 2016-12-18 DIAGNOSIS — Z124 Encounter for screening for malignant neoplasm of cervix: Secondary | ICD-10-CM | POA: Diagnosis present

## 2016-12-18 DIAGNOSIS — E119 Type 2 diabetes mellitus without complications: Secondary | ICD-10-CM | POA: Diagnosis not present

## 2016-12-18 DIAGNOSIS — Z7984 Long term (current) use of oral hypoglycemic drugs: Secondary | ICD-10-CM | POA: Insufficient documentation

## 2016-12-18 DIAGNOSIS — Z811 Family history of alcohol abuse and dependence: Secondary | ICD-10-CM | POA: Insufficient documentation

## 2016-12-18 DIAGNOSIS — Z87891 Personal history of nicotine dependence: Secondary | ICD-10-CM | POA: Diagnosis not present

## 2016-12-18 DIAGNOSIS — Z6841 Body Mass Index (BMI) 40.0 and over, adult: Secondary | ICD-10-CM | POA: Diagnosis not present

## 2016-12-18 DIAGNOSIS — B977 Papillomavirus as the cause of diseases classified elsewhere: Secondary | ICD-10-CM | POA: Diagnosis not present

## 2016-12-18 DIAGNOSIS — R928 Other abnormal and inconclusive findings on diagnostic imaging of breast: Secondary | ICD-10-CM | POA: Diagnosis present

## 2016-12-18 DIAGNOSIS — Z79899 Other long term (current) drug therapy: Secondary | ICD-10-CM | POA: Insufficient documentation

## 2016-12-18 DIAGNOSIS — Z1151 Encounter for screening for human papillomavirus (HPV): Secondary | ICD-10-CM | POA: Diagnosis not present

## 2016-12-18 DIAGNOSIS — G7000 Myasthenia gravis without (acute) exacerbation: Secondary | ICD-10-CM | POA: Insufficient documentation

## 2016-12-18 DIAGNOSIS — I471 Supraventricular tachycardia: Secondary | ICD-10-CM | POA: Diagnosis not present

## 2016-12-18 DIAGNOSIS — Z833 Family history of diabetes mellitus: Secondary | ICD-10-CM | POA: Insufficient documentation

## 2016-12-18 DIAGNOSIS — D72819 Decreased white blood cell count, unspecified: Secondary | ICD-10-CM

## 2016-12-18 DIAGNOSIS — N6320 Unspecified lump in the left breast, unspecified quadrant: Secondary | ICD-10-CM

## 2016-12-18 NOTE — Progress Notes (Signed)
Patient ID: Amber Decker, female   DOB: 08/08/1955, 61 y.o.   MRN: [id number]   Subjective:    Patient ID: Amber Decker, female    DOB: 11/10/1955, 61 y.o.   MRN: [id number]  HPI  Patient here for a scheduled follow up and repeat pap smear.  She reports some increased stress recently.  Brother diagnosed with cancer.  States she is handling things relatively well.  Has good support.  Discussed increased exercise and diet.  No chest pain.  Breathing stable.  No acid reflux.  No abdominal pain.  Bowels moving.  Needs repeat pap.  Last pap positive for HPV.    Past Medical History:  Diagnosis Date  . Diabetes mellitus without complication (HCC)   . Herpes   . Hypertension   . Myasthenia gravis (HCC)   . SVT (supraventricular tachycardia) (HCC)    Past Surgical History:  Procedure Laterality Date  . UTERINE FIBROID SURGERY     Family History  Problem Relation Age of Onset  . Diabetes Mother   . Alcohol abuse Father   . Heart disease Father        myocardial infarction  . Breast cancer Neg Hx   . Colon cancer Neg Hx    Social History   Socioeconomic History  . Marital status: Divorced    Spouse name: None  . Number of children: 0  . Years of education: None  . Highest education level: None  Social Needs  . Financial resource strain: None  . Food insecurity - worry: None  . Food insecurity - inability: None  . Transportation needs - medical: None  . Transportation needs - non-medical: None  Occupational History    Employer: OTHER  Tobacco Use  . Smoking status: Former Smoker  . Smokeless tobacco: Never Used  Substance and Sexual Activity  . Alcohol use: No    Alcohol/week: 0.0 oz  . Drug use: No  . Sexual activity: None  Other Topics Concern  . None  Social History Narrative  . None    Outpatient Encounter Medications as of 12/18/2016  Medication Sig  . acyclovir (ZOVIRAX) 400 MG tablet Take 1 tablet (400 mg total) daily by mouth.  . Biotin 1000 MCG tablet  Take 1,000 mcg by mouth daily.  . cholecalciferol (VITAMIN D) 1000 UNITS tablet Take 1,000 Units by mouth daily.  . lisinopril-hydrochlorothiazide (PRINZIDE,ZESTORETIC) 10-12.5 MG tablet Take 1 tablet by mouth daily.  . metFORMIN (GLUCOPHAGE) 1000 MG tablet Take 1 tablet (1,000 mg total) by mouth 2 (two) times daily with a meal.  . metoprolol tartrate (LOPRESSOR) 25 MG tablet Take 1 tablet (25 mg total) by mouth 2 (two) times daily.  . Multiple Vitamins-Minerals (ALIVE WOMENS 50+ PO) Take by mouth daily.  . Naphazoline-Polyethyl Glycol (EYE DROPS) 0.012-0.2 % SOLN Apply to eye.  . ranitidine (ZANTAC) 150 MG tablet Take 150 mg by mouth as needed for heartburn.  . [DISCONTINUED] acyclovir (ZOVIRAX) 400 MG tablet Take 1 tablet (400 mg total) by mouth daily.  . [DISCONTINUED] acyclovir (ZOVIRAX) 400 MG tablet TAKE 1 TABLET BY MOUTH EVERY DAY   No facility-administered encounter medications on file as of 12/18/2016.     Review of Systems  Constitutional: Negative for appetite change and unexpected weight change.  HENT: Negative for congestion and sinus pressure.   Respiratory: Negative for cough, chest tightness and shortness of breath.   Cardiovascular: Negative for chest pain, palpitations and leg swelling.  Gastrointestinal: Negative for abdominal pain, diarrhea, nausea   and vomiting.  Genitourinary: Negative for difficulty urinating and dysuria.  Musculoskeletal: Negative for back pain and joint swelling.  Skin: Negative for color change and rash.  Neurological: Negative for dizziness, light-headedness and headaches.  Psychiatric/Behavioral: Negative for agitation and dysphoric mood.       Increased stress as outlined.         Objective:    Physical Exam  Constitutional: She appears well-developed and well-nourished. No distress.  HENT:  Nose: Nose normal.  Mouth/Throat: Oropharynx is clear and moist.  Neck: Neck supple. No thyromegaly present.  Cardiovascular: Normal rate and  regular rhythm.  Pulmonary/Chest: Breath sounds normal. No respiratory distress. She has no wheezes.  Abdominal: Soft. Bowel sounds are normal. There is no tenderness.  Genitourinary:  Genitourinary Comments: Normal external genitalia.  Vaginal vault without lesions.  Cervix identified.  Pap smear performed.  Could not appreciate any adnexal masses or tenderness.    Musculoskeletal: She exhibits no edema or tenderness.  Lymphadenopathy:    She has no cervical adenopathy.  Skin: No rash noted. No erythema.  Psychiatric: She has a normal mood and affect. Her behavior is normal.    BP 128/84 (BP Location: Left Arm, Patient Position: Sitting, Cuff Size: Large)   Pulse 80   Temp 97.9 F (36.6 C) (Oral)   Resp 18   Ht 5' 8.9" (1.75 m)   Wt 276 lb 6.4 oz (125.4 kg)   LMP 02/21/2012   SpO2 98%   BMI 40.94 kg/m  Wt Readings from Last 3 Encounters:  12/18/16 276 lb 6.4 oz (125.4 kg)  07/22/16 280 lb 3.2 oz (127.1 kg)  04/15/16 277 lb (125.6 kg)     Lab Results  Component Value Date   WBC 3.6 (L) 12/17/2016   HGB 13.2 12/17/2016   HCT 38.9 12/17/2016   PLT 205.0 12/17/2016   GLUCOSE 161 (H) 07/11/2016   CHOL 176 07/11/2016   TRIG 56.0 07/11/2016   HDL 70.60 07/11/2016   LDLCALC 94 07/11/2016   ALT 11 07/11/2016   AST 14 07/11/2016   NA 139 07/11/2016   K 4.4 07/11/2016   CL 105 07/11/2016   CREATININE 0.89 07/11/2016   BUN 23 07/11/2016   CO2 28 07/11/2016   TSH 2.26 07/11/2016   HGBA1C 7.5 (H) 07/11/2016   MICROALBUR 0.7 10/09/2015       Assessment & Plan:   Problem List Items Addressed This Visit    Diabetes ([place])    Low carb diet and exercise.  Follow met b and a1c.  On metformin.        Relevant Orders   Lipid panel   Hepatic function panel   Hemoglobin O9G   Basic metabolic panel   Essential hypertension, benign    Blood pressure under good control.  Continue same medication regimen.  Follow pressures.  Follow metabolic panel.        HPV in female     PAP 04/15/16 - positive HPV.  Repeat pap today.        Relevant Medications   acyclovir (ZOVIRAX) 400 MG tablet   Myasthenia gravis (HCC)    Stable.       Severe obesity (BMI >= 40) (HCC)    Discussed diet and exercise.  Follow.        SVT (supraventricular tachycardia) (HCC)    On metoprolol.  Doing well.  Follow.         Other Visit Diagnoses    Cervical cancer screening    -  Primary   Relevant Orders   Cytology - PAP   Leukopenia, unspecified type       Relevant Orders   CBC with Differential/Platelet       SCOTT, CHARLENE, MD  

## 2016-12-22 ENCOUNTER — Encounter: Payer: Self-pay | Admitting: Internal Medicine

## 2016-12-22 MED ORDER — ACYCLOVIR 400 MG PO TABS: 400.0000 mg | ORAL_TABLET | Freq: Every day | ORAL | 1 refills | Status: DC

## 2016-12-22 NOTE — Assessment & Plan Note (Signed)
On metoprolol.  Doing well.  Follow.

## 2016-12-22 NOTE — Assessment & Plan Note (Signed)
Stable

## 2016-12-22 NOTE — Assessment & Plan Note (Signed)
Discussed diet and exercise.  Follow.  

## 2016-12-22 NOTE — Assessment & Plan Note (Signed)
Low carb diet and exercise.  Follow met b and a1c.  On metformin.   

## 2016-12-22 NOTE — Assessment & Plan Note (Signed)
PAP 04/15/16 - positive HPV.  Repeat pap today.

## 2016-12-22 NOTE — Assessment & Plan Note (Signed)
Blood pressure under good control.  Continue same medication regimen.  Follow pressures.  Follow metabolic panel.   

## 2016-12-23 ENCOUNTER — Other Ambulatory Visit: Payer: Self-pay | Admitting: Internal Medicine

## 2016-12-23 ENCOUNTER — Telehealth: Payer: Self-pay | Admitting: General Surgery

## 2016-12-23 DIAGNOSIS — R928 Other abnormal and inconclusive findings on diagnostic imaging of breast: Secondary | ICD-10-CM

## 2016-12-23 LAB — CYTOLOGY - PAP
Diagnosis: NEGATIVE
HPV: NOT DETECTED

## 2016-12-23 NOTE — Progress Notes (Signed)
Order placed for surgery referral - Dr Byrnett - for abnormal mammogram.  

## 2016-12-23 NOTE — Telephone Encounter (Signed)
I TRIED CALLING PATIENT AT HER HOME NUMBER & THE NUMBER IS NOT IN SERVICE AND CELL NUMBERS MESSAGES ARE FULL.(UNABLE TO LEAVE ONE)REF'D Amber Decker FOR CAT 4 MAMMO DONE 12-02-16,ADDED VIEWS & U/S DONE 12-18-16 TO DR BYRNETT.

## 2016-12-24 ENCOUNTER — Encounter: Payer: Self-pay | Admitting: General Surgery

## 2017-01-08 ENCOUNTER — Encounter: Payer: Self-pay | Admitting: *Deleted

## 2017-01-15 ENCOUNTER — Encounter: Payer: Self-pay | Admitting: General Surgery

## 2017-01-15 ENCOUNTER — Ambulatory Visit: Payer: BLUE CROSS/BLUE SHIELD | Admitting: General Surgery

## 2017-01-15 VITALS — BP 144/82 | HR 66 | Resp 14 | Ht 69.0 in | Wt 275.0 lb

## 2017-01-15 DIAGNOSIS — N6321 Unspecified lump in the left breast, upper outer quadrant: Secondary | ICD-10-CM | POA: Insufficient documentation

## 2017-01-15 NOTE — Progress Notes (Signed)
Patient ID: Amber Decker, female   DOB: 11/21/1955, 61 y.o.   MRN: [id number]  Chief Complaint  Patient presents with  . Breast Problem    HPI Amber Decker is a 61 y.o. female.  who presents for a breast evaluation. The most recent mammogram was done on 12-03-16. Left breast ultrasound done 12-18-16. Patient does occasionally perform regular self breast checks and gets regular mammograms done.    No family history of breast cancer. Denis any breast injury or trauma. She does not routinely check her glucose levels.  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  HPI  Past Medical History:  Diagnosis Date  . Diabetes mellitus without complication (HCC) 2008  . Herpes   . Hypertension   . Myasthenia gravis [facility](HCC)    onset age 60's?  . SVT (supraventricular tachycardia) (HCC)     Past Surgical History:  Procedure Laterality Date  . MYOMECTOMY    . [facility]     [facility]    Family History  Problem Relation Age of Onset  . Diabetes Mother   . Alcohol abuse Father   . Heart disease Father        myocardial infarction  . Breast cancer Neg Hx   . Colon cancer Neg Hx     Social History Social History   Tobacco Use  . Smoking status: Never Smoker  . Smokeless tobacco: Never Used  Substance Use Topics  . Alcohol use: Yes    Alcohol/week: 0.0 oz    Comment: occasionally  . Drug use: No    Allergies  Allergen Reactions  . Erythromycin     Current Outpatient Medications  Medication Sig Dispense Refill  . acyclovir (ZOVIRAX) 400 MG tablet Take 1 tablet (400 mg total) daily by mouth. 90 tablet 1  . aspirin EC 81 MG tablet Take 81 mg by mouth daily.    . Biotin 1000 MCG tablet Take 1,000 mcg by mouth daily.    Tery Sanfilippo. Docusate Calcium (STOOL SOFTENER PO) Take by mouth as needed.    [name]. lisinopril-hydrochlorothiazide (PRINZIDE,ZESTORETIC) 10-12.5 MG tablet Take 1 tablet by mouth daily. 90 tablet 3  . metFORMIN (GLUCOPHAGE) 1000 MG tablet Take 1  tablet (1,000 mg total) by mouth 2 (two) times daily with a meal. 180 tablet 3  . metoprolol tartrate (LOPRESSOR) 25 MG tablet Take 1 tablet (25 mg total) by mouth 2 (two) times daily. 180 tablet 3  . Multiple Vitamins-Minerals (ALIVE WOMENS 50+ PO) Take by mouth every other day.     . Naphazoline-Polyethyl Glycol (EYE DROPS) 0.012-0.2 % SOLN Apply to eye as needed.     . Omega-3 Fatty Acids (FISH OIL) 500 MG CAPS Take by mouth daily.    . ranitidine (ZANTAC) 150 MG tablet Take 150 mg by mouth as needed for heartburn.     No current facility-administered medications for this visit.     Review of Systems Review of Systems  Constitutional: Negative.   Respiratory: Negative.   Cardiovascular: Negative.     Blood pressure (!) 144/82, pulse 66, resp. rate 14, height 5\' 9"  (1.753 m), weight 275 lb (124.7 kg), last menstrual period 02/21/2012.  Physical Exam Physical Exam  Constitutional: She is oriented to person, place, and time. She appears well-developed and well-nourished.  HENT:  Mouth/Throat: Oropharynx is clear and moist.  Eyes: Conjunctivae are normal. No scleral icterus.  Neck: Neck supple.  Cardiovascular: Normal rate, regular rhythm and normal heart sounds.  Pulmonary/Chest: Effort normal  and breath sounds normal. Right breast exhibits no inverted nipple, no mass, no nipple discharge, no skin change and no tenderness. Left breast exhibits no inverted nipple, no mass, no nipple discharge, no skin change and no tenderness.  Lymphadenopathy:    She has no cervical adenopathy.    She has no axillary adenopathy.  Neurological: She is alert and oriented to person, place, and time.  Skin: Skin is warm and dry.  1.5 cm skin tag left lower back  Psychiatric: Her behavior is normal.    Data Reviewed 2011 through 2017-2008 mammograms reviewed.  2018 exam shows a ill-defined dense in the 3:00/upper outer quadrant of the left breast. BIRAD-4.  Retrospective review of the 2017  mammogram suggests that this may be developing density, and for this reasonI think early rather than late biopsy would be appropriate.    Assessment    Abnormal left breast mammogram, developing density in the  3:00 position.      Plan           As the 2017 study in retrospective review suggests that it may have been a saphenous area that time, I would bode for early rather than late biopsy.  Lower risk of upstaging with a 6 month interval if the patient desired on observation.  She will decide and call back.  The stereotactic procedure was reviewed with the patient. The potential for bleeding, infection and pain was reviewed. At this time, the benefits outweigh the risk, and the patient is amenable to proceed.   HPI, Physical Exam, Assessment and Plan have been scribed under the direction and in the presence of Earline MayotteJeffrey W. Dillie Burandt, MD. Dorathy DaftMarsha Hatch, RN  I have completed the exam and reviewed the above documentation for accuracy and completeness.  I agree with the above.  [employer] has been used and any errors in dictation or transcription are unintentional.  Donnalee CurryJeffrey Areej Tayler, M.D., F.A.C.S.   Earline MayotteByrnett, Jayzen Paver W 01/15/2017, 7:54 PM  Patient wishes to think about having a left stereo biopsy either with Dr. Lemar LivingsByrnett or through the radiology department. The patient will notify us how she would like to proceed.   Nicholes MangoMichele J. Bailey, CMA

## 2017-01-15 NOTE — Patient Instructions (Signed)
Stereotactic Breast Biopsy A breast biopsy is a procedure in which a sample of suspicious breast tissue is removed from your breast. In a stereotactic breast biopsy, an X-ray of the breast (mammogram) is used during the procedure to locate the area of the breast where the tissue sample will be taken. After the procedure, the tissue that is removed from the breast is examined under a microscope to see if cancerous cells are present. You may need a stereotactic breast biopsy if you have:  Any undiagnosed breast mass (tumor).  Calcium deposits (calcifications) or abnormalities seen on a mammogram, ultrasound results, or MRI results.  Nipple abnormalities, dimpling, crusting, or ulcerations.  Abnormal discharge from the nipple, especially blood.  Redness, swelling, and pain of the breast.  Suspicious changes in the breast seen on your mammogram.  If the breast abnormality is found to be cancerous (malignant), a stereotactic breast biopsy can help to determine what the best treatment is for you. Tell a health care provider about:  Any allergies you have.  All medicines you are taking, including vitamins, herbs, eye drops, creams, and over-the-counter medicines.  Any problems you or family members have had with anesthetic medicines.  Any blood disorders you have.  Any surgeries you have had.  Any medical conditions you have.  Whether you are pregnant or may be pregnant. What are the risks? Generally, this is a safe procedure. However, problems may occur, including:  Infection at the needle-insertion site.  Bleeding.  Soreness.  Allergic reactions to medicines.  Bruising and swelling of the breast.  Change in the shape of the breast.  Damage to other tissues.  What happens before the procedure?  Ask your health care provider about: ? Changing or stopping your regular medicines. This is especially important if you are taking diabetes medicines or blood thinners. ? Taking  medicines such as aspirin and ibuprofen. These medicines can thin your blood. Do not take these medicines before your procedure if your health care provider instructs you not to.  Wear a good support bra to the procedure.  Do not put on antiperspirant or deodorant the day of the procedure, because it may cause white spots on the X-ray images.  You may be screened for extra fluid around the lymph nodes (lymphedema).  You will be asked to remove jewelry, dentures, eyeglasses, metal objects, or clothing that might interfere with the X-ray images. What happens during the procedure?  You will lie face-down on a table. Your breast will pass through an opening in the table.  Your breast will be gently compressed into an unchanging (fixed) position between a breast platform and a compression plate. Try to stay as relaxed as possible during the procedure. You will need to stay in one position for the length of the procedure.  X-rays will be used to locate the breast lump.  Your skin will be washed with soap, and you will be given a medicine to numb the area (local anesthetic).  A small incision will be made in your breast.  The tip of the biopsy needle will be directed through the incision. Several small pieces of suspicious tissue will be collected.  Then, a final set of X-ray images will be taken. If they show that the suspicious tissue has been mostly or completely removed, a small clip will be left at the biopsy site. This will allow the biopsy site to be easily located if the results of the biopsy show that the tissue is cancerous.  The incision   will be stitched (sutured) or taped and covered with a bandage (dressing). Your health care provider may apply a bandage that is wrapped tightly around your chest (pressure dressing) and an ice pack to prevent bleeding and swelling in the breast. The procedure may vary among health care providers and hospitals. What happens after the procedure?  If  you are doing well and have no problems, you will be allowed to go home.  It is up to you to get the results of your procedure. Ask your health care provider, or the department that is doing the procedure, when your results will be ready. This information is not intended to replace advice given to you by your health care provider. Make sure you discuss any questions you have with your health care provider. Document Released: 10/26/2002 Document Revised: 10/11/2015 Document Reviewed: 10/11/2015 Elsevier Interactive Patient Education  2018 Elsevier Inc.  

## 2017-01-29 ENCOUNTER — Other Ambulatory Visit (INDEPENDENT_AMBULATORY_CARE_PROVIDER_SITE_OTHER): Payer: BLUE CROSS/BLUE SHIELD

## 2017-01-29 DIAGNOSIS — Z23 Encounter for immunization: Secondary | ICD-10-CM | POA: Diagnosis not present

## 2017-01-29 DIAGNOSIS — E119 Type 2 diabetes mellitus without complications: Secondary | ICD-10-CM | POA: Diagnosis not present

## 2017-01-29 DIAGNOSIS — D72819 Decreased white blood cell count, unspecified: Secondary | ICD-10-CM

## 2017-01-29 LAB — HEPATIC FUNCTION PANEL
ALT: 12 U/L (ref 0–35)
AST: 14 U/L (ref 0–37)
Albumin: 4.2 g/dL (ref 3.5–5.2)
Alkaline Phosphatase: 52 U/L (ref 39–117)
Bilirubin, Direct: 0.1 mg/dL (ref 0.0–0.3)
Total Bilirubin: 0.4 mg/dL (ref 0.2–1.2)
Total Protein: 7.4 g/dL (ref 6.0–8.3)

## 2017-01-29 LAB — LIPID PANEL
Cholesterol: 179 mg/dL (ref 0–200)
HDL: 69.8 mg/dL (ref >39.00)
LDL Cholesterol: 96 mg/dL (ref 0–99)
NonHDL: 108.82
Total CHOL/HDL Ratio: 3
Triglycerides: 66 mg/dL (ref 0.0–149.0)
VLDL: 13.2 mg/dL (ref 0.0–40.0)

## 2017-01-29 LAB — BASIC METABOLIC PANEL
BUN: 20 mg/dL (ref 6–23)
CO2: 28 mEq/L (ref 19–32)
Calcium: 9.1 mg/dL (ref 8.4–10.5)
Chloride: 104 mEq/L (ref 96–112)
Creatinine, Ser: 0.81 mg/dL (ref 0.40–1.20)
GFR: 92.59 mL/min (ref >60.00)
Glucose, Bld: 199 mg/dL — ABNORMAL HIGH (ref 70–99)
Potassium: 4.2 mEq/L (ref 3.5–5.1)
Sodium: 138 mEq/L (ref 135–145)

## 2017-01-29 LAB — CBC WITH DIFFERENTIAL/PLATELET
Basophils Absolute: 0 10*3/uL (ref 0.0–0.1)
Basophils Relative: 0.8 % (ref 0.0–3.0)
Eosinophils Absolute: 0.2 10*3/uL (ref 0.0–0.7)
Eosinophils Relative: 4.2 % (ref 0.0–5.0)
HCT: 40.4 % (ref 36.0–46.0)
Hemoglobin: 13.7 g/dL (ref 12.0–15.0)
Lymphocytes Relative: 48.5 % — ABNORMAL HIGH (ref 12.0–46.0)
Lymphs Abs: 1.9 10*3/uL (ref 0.7–4.0)
MCHC: 34 g/dL (ref 30.0–36.0)
MCV: 89.7 fl (ref 78.0–100.0)
Monocytes Absolute: 0.4 10*3/uL (ref 0.1–1.0)
Monocytes Relative: 9.7 % (ref 3.0–12.0)
Neutro Abs: 1.4 10*3/uL (ref 1.4–7.7)
Neutrophils Relative %: 36.8 % — ABNORMAL LOW (ref 43.0–77.0)
Platelets: 202 10*3/uL (ref 150.0–400.0)
RBC: 4.5 Mil/uL (ref 3.87–5.11)
RDW: 13 % (ref 11.5–15.5)
WBC: 3.8 10*3/uL — ABNORMAL LOW (ref 4.0–10.5)

## 2017-01-29 LAB — HEMOGLOBIN A1C: Hgb A1c MFr Bld: 8.6 % — ABNORMAL HIGH (ref 4.6–6.5)

## 2017-01-30 ENCOUNTER — Encounter: Payer: Self-pay | Admitting: *Deleted

## 2017-02-12 ENCOUNTER — Encounter: Payer: Self-pay | Admitting: General Surgery

## 2017-02-20 ENCOUNTER — Other Ambulatory Visit: Payer: Self-pay | Admitting: Internal Medicine

## 2017-02-22 NOTE — Progress Notes (Signed)
Pt apt cancelled and rescheduled in error.

## 2017-02-23 ENCOUNTER — Ambulatory Visit (INDEPENDENT_AMBULATORY_CARE_PROVIDER_SITE_OTHER): Payer: BLUE CROSS/BLUE SHIELD | Admitting: Pharmacist

## 2017-02-23 DIAGNOSIS — E119 Type 2 diabetes mellitus without complications: Secondary | ICD-10-CM

## 2017-02-23 MED ORDER — EMPAGLIFLOZIN 10 MG PO TABS: 10.0000 mg | ORAL_TABLET | Freq: Every day | ORAL | 1 refills | Status: DC

## 2017-02-23 MED ORDER — EMPAGLIFLOZIN 10 MG PO TABS: 10.0000 mg | ORAL_TABLET | Freq: Every day | ORAL | 0 refills | Status: DC

## 2017-02-23 MED ORDER — METFORMIN HCL ER (OSM) 1000 MG PO TB24: 2000.0000 mg | ORAL_TABLET | Freq: Every day | ORAL | 3 refills | Status: DC

## 2017-02-23 NOTE — Progress Notes (Signed)
S:     Chief Complaint  Patient presents with  . Medication Management    Diabetes    Patient arrives in good spirits.  Presents for diabetes evaluation, education, and management at the request of Dr. Lorin Picket (referred on 01/29/2017). Last seen by primary care provider on 01/29/2017 - at that time A1C was found to be increased and she was referred to Rx clinic.  Today, patient reports checking blood glucose a few times a week but endorses "95%" adherence to medications. Patient is very resistant to adding on medication to control DM and inquires if these changes will be lifelong. Patient states she walks occasionally when the weather is nice outside about 2x week. Patient states she has membership to the [facility] for approximately 5 months now but states she lacks motivation. Inquires about freestyle CGM, defers discussion until next visit.   Patient reports Diabetes was diagnosed ~7 years ago.   Family/Social History: DM in mother - was on insulin towards the end of her life.  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  Current diabetes medications include: metformin 1000 mg BID Current hypertension medications include: lisinopril-HCTZ 10-12.5 mg daily, metoprolol tartrate 25 mg BID  Patient denies hypoglycemic events.  Patient reported dietary habits: Eats 3 meals/day Breakfast: 2 scrambled eggs + diced tomatoes, tomato juice Lunch:pears, 3-4 chunks of beef nuggets Dinner:rotissaire chicken, turnip greens Snacks:gouda cheese (1 oz)  Drinks:coffee (creamer + splenda), tomato juice, water, diet soda  Patient reported exercise habits: walking 2x week for 20-30 minutes    Patient reports nocturia. 1-2 times nightly. Patient denies pain/burning on urination.  Patient reports neuropathy. "achey legs" Patient denies visual changes. Patient reports self foot exams. No issues.   O:    Physical Exam  Constitutional: She appears well-developed and well-nourished.     Review of Systems  All other systems reviewed and are negative.    Lab Results  Component Value Date   HGBA1C 8.6 (H) 01/29/2017   Vitals:   02/23/17 1507  BP: (!) 141/83  Pulse: 73    Lipid Panel     Component Value Date/Time   CHOL 179 01/29/2017 0839   TRIG 66.0 01/29/2017 0839   HDL 69.80 01/29/2017 0839   CHOLHDL 3 01/29/2017 0839   VLDL 13.2 01/29/2017 0839   LDLCALC 96 01/29/2017 0839   GFR 93 as of 01/29/2017  Home fasting CBG: 161 2 hour post-prandial/random CBG: 140s (before supper)  Clinical ASCVD: No ; Major ASCVD events: None High-risk conditions: DM and HTN  A/P: #Diabetes longstanding diagnosed~ 7 years ago currently poorly controlled. Patient denies hypoglycemic events and is able to verbalize appropriate hypoglycemia management plan. Patient reports adherence with medication. Control is suboptimal due to pancreatic insufficiency, dietary indiscretion and sedentary lifestyle.  - Initiate Jardiance 10mg  by mouth daily.  Patient educated on purpose, proper use and potential adverse effects of Jardiance.  Following instruction patient verbalized understanding of treatment plan. Counseled on sick day rules. -Continue Metformin 1000mg  BID until gone, then change to Metformin ER 2000mg  24Hr tablet once a day per patient preference - Next A1C anticipated May 30, 2017.   - Continue to check blood glucose daily -F/u CGM interest at next visit  #ASCVD risk - DM aged 23-75 yo, however baseline LDL <100 mg/dL. Patient resistant to initiation of new medications.  - Continued Aspirin 81 mg  - Consider initiation of moderate intensity statin at next visit  #  Hypertension longstanding diagnosed currently uncontrolled.  Patient reports adherence with medication. Endorses stress at this time with discussion of medication addition.   - Continue lisinopril-hctz and metoprolol  tartrate -Consider titration at next visit  Written patient instructions provided.  Total time in face to face counseling 60 minutes.    Follow up in Pharmacist Clinic Visit one month.   Patient seen with Karna DupesMedina Rasul PharmD Candidate [address]2019   Caroline E OwentonWelles, VermontPharm.D., BCPS, CPP PGY2 Ambulatory Care Pharmacy Resident Phone: [phone]

## 2017-02-23 NOTE — Assessment & Plan Note (Signed)
#  Hypertension longstanding diagnosed currently uncontrolled.  Patient reports adherence with medication. Endorses stress at this time with discussion of medication addition.   - Continue lisinopril-hctz and metoprolol tartrate -Consider titration at next visit

## 2017-02-23 NOTE — Patient Instructions (Addendum)
Thanks for coming to see us today! We are going to get on top of this.   1. Start Jardiance 10 mg once daily in the mornings. DO NOT take this if you are sick/dehydrated/throwing up  2. Continue your Metformin 1000 mg twice a day until those are gone, then switch to the Metformin ER 1000 mg 2 pills in the morning.   Work on increasing exercise time to 3 times a week (you can go tomorrow!) and watch portion control of starches/carbs  A1C and equivalent blood sugars listed below: 6 percent =  126 mg/dL (7 mmol/L) 7 percent = 154 mg/dL (8.6 mmol/L)  8 percent = 183 mg/dL (29.510.2 mmol/L)  9 percent = 212 mg/dL (62.111.8 mmol/L)  10 percent = 240 mg/dL (30.813.4 mmol/L)  11 percent = 269 mg/dL (65.714.9 mmol/L)  12 percent = 298 mg/dL (84.616.5 mmol/L)

## 2017-02-23 NOTE — Assessment & Plan Note (Signed)
#  Diabetes longstanding diagnosed~ 7 years ago currently poorly controlled. Patient denies hypoglycemic events and is able to verbalize appropriate hypoglycemia management plan. Patient reports adherence with medication. Control is suboptimal due to pancreatic insufficiency, dietary indiscretion and sedentary lifestyle.  - Initiate Jardiance 10mg  by mouth daily.  Patient educated on purpose, proper use and potential adverse effects of Jardiance.  Following instruction patient verbalized understanding of treatment plan. Counseled on sick day rules. -Continue Metformin 1000mg  BID until gone, then change to Metformin ER 2000mg  24Hr tablet once a day per patient preference - Next A1C anticipated May 30, 2017.   - Continue to check blood glucose daily -F/u CGM interest at next visit  #ASCVD risk - DM aged 61-75 yo, however baseline LDL <100 mg/dL. Patient resistant to initiation of new medications.  - Continued Aspirin 81 mg  - Consider initiation of moderate intensity statin at next visit

## 2017-03-29 NOTE — Progress Notes (Unsigned)
S:  No chief complaint on file.   Patient arrives in good spirits. Presents for diabetes evaluation, education, and management at the request of Dr. Scott(referred on 01/29/2017). Last seen by primary care provider on12/20/2018- at that time A1C was found to be increased and she was referred to Rx clinic. Last Rx Clinic visit on 02/23/17 - at that time Jardiance was started  Jardiance tolerance *** Metformin XR start yet*** Freestyle Libre CGM *** Exercise ([employer])*** CBG checks *** Adherence ***  Increase Jardiance Add statin (moderate)  Patient reports Diabetes was diagnosed ~7 years ago.   Family/Social History: DM in mother - was on insulin towards the end of her life.  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  Current diabetes medications include:metformin 1000 mg BID Current hypertension medications include:lisinopril-HCTZ 10-12.5 mg daily, metoprolol tartrate 25 mg BID  Patient denies hypoglycemic events.  Patient reported dietary habits: Eats 3 meals/day*** Breakfast: 2 scrambled eggs + diced tomatoes, tomato juice Lunch:pears, 3-4 chunks of beef nuggets Dinner:rotissaire chicken, turnip greens Snacks:gouda cheese (1 oz)  Drinks:coffee (creamer + splenda), tomato juice, water, diet soda  Patient reported exercise habits: walking 2x week for 20-30 minutes    Patient {Actions; denies-reports:120008} nocturia.  Patient {Actions; denies-reports:120008} pain/burning on urination.  Patient {Actions; denies-reports:120008} neuropathy. Patient {Actions; denies-reports:120008} visual changes. Patient {Actions; denies-reports:120008} self foot exams.   Benedict Needy.[email or web address]   O:  Physical Exam   ROS   Lab Results  Component Value Date   HGBA1C 8.6 (H) 01/29/2017   There were no vitals filed for this visit.  Lipid Panel     Component Value Date/Time   CHOL 179 01/29/2017 0839   TRIG 66.0 01/29/2017 0839   HDL 69.80 01/29/2017 0839   CHOLHDL 3 01/29/2017 0839   VLDL 13.2 01/29/2017 0839   LDLCALC 96 01/29/2017 0839    Home fasting CBG: ***  2 hour post-prandial/random CBG: ***.  GFR 93 as of 01/29/2017  Home fasting CBG: 161 2 hour post-prandial/random CBG: 140s (before supper)  Clinical ASCVD: No ; Major ASCVD events: None High-risk conditions: DM and HTN  FYI - Goal LDL < 70 mg/dL in patients with ASCVD and in very-high risk ASCVD patients***  A/P: #Diabetes longstanding diagnosed~ 7 years ago currently poorly controlled. Patient denies hypoglycemic events and is able to verbalize appropriate hypoglycemia management plan. Patient reports adherence with medication. Control is suboptimal due to pancreatic insufficiency, dietary indiscretion and sedentary lifestyle.  - Initiate Jardiance 10mg  by mouth daily.  Patient educated on purpose, proper use and potential adverse effects of Jardiance.  Following instruction patient verbalized understanding of treatment plan. Counseled on sick day rules. -Continue Metformin 1000mg  BID until gone, then change to Metformin ER 2000mg  24Hr tablet once a day per patient preference - Next A1C anticipated May 30, 2017.   - Continue to check blood glucose daily -F/u CGM interest at next visit  #ASCVD risk - DM aged 61-75 yo, however baseline LDL <100 mg/dL. Patient resistant to initiation of new medications.  - Continued Aspirin 81 mg  - Consider initiation of moderate intensity statin at next visit  #Hypertension longstanding diagnosed currently uncontrolled.  Patient reports adherence with medication. Endorses stress at this time with discussion of medication addition.   - Continue lisinopril-hctz and metoprolol tartrate -Consider titration at next visit  Written patient instructions provided.  Total time in face to face counseling *** minutes.    Follow up in  Pharmacist Clinic Visit ***.    Patient seen with ***  Allena Katz, Pharm.D., BCPS, CPP PGY2 Ambulatory Care Pharmacy Resident Phone: [phone]

## 2017-03-30 ENCOUNTER — Ambulatory Visit: Payer: BLUE CROSS/BLUE SHIELD | Admitting: Pharmacist

## 2017-04-03 ENCOUNTER — Other Ambulatory Visit: Payer: Self-pay | Admitting: Internal Medicine

## 2017-04-03 NOTE — Telephone Encounter (Signed)
Pt states she is out of her meds and thought Dr Lorin PicketScott refilled at her last visit in 12/2016. Pt hopes to get today if possible   [facility] [address] - [place] - [address] [phone] (Phone) [phone] (Fax)

## 2017-04-21 ENCOUNTER — Ambulatory Visit: Payer: BLUE CROSS/BLUE SHIELD | Admitting: Internal Medicine

## 2017-04-21 ENCOUNTER — Encounter: Payer: Self-pay | Admitting: Internal Medicine

## 2017-04-21 VITALS — BP 120/74 | HR 76 | Temp 98.2°F | Resp 16 | Wt 277.4 lb

## 2017-04-21 DIAGNOSIS — I471 Supraventricular tachycardia, unspecified: Secondary | ICD-10-CM

## 2017-04-21 DIAGNOSIS — Z6841 Body Mass Index (BMI) 40.0 and over, adult: Secondary | ICD-10-CM

## 2017-04-21 DIAGNOSIS — D72819 Decreased white blood cell count, unspecified: Secondary | ICD-10-CM | POA: Diagnosis not present

## 2017-04-21 DIAGNOSIS — I10 Essential (primary) hypertension: Secondary | ICD-10-CM

## 2017-04-21 DIAGNOSIS — G7000 Myasthenia gravis without (acute) exacerbation: Secondary | ICD-10-CM

## 2017-04-21 DIAGNOSIS — R928 Other abnormal and inconclusive findings on diagnostic imaging of breast: Secondary | ICD-10-CM

## 2017-04-21 DIAGNOSIS — L989 Disorder of the skin and subcutaneous tissue, unspecified: Secondary | ICD-10-CM

## 2017-04-21 DIAGNOSIS — N6321 Unspecified lump in the left breast, upper outer quadrant: Secondary | ICD-10-CM

## 2017-04-21 DIAGNOSIS — Z0289 Encounter for other administrative examinations: Secondary | ICD-10-CM

## 2017-04-21 DIAGNOSIS — E119 Type 2 diabetes mellitus without complications: Secondary | ICD-10-CM

## 2017-04-21 NOTE — Progress Notes (Signed)
Patient ID: Amber Decker, female   DOB: 29-Oct-1955, 62 y.o.   MRN: [id number]   Subjective:    Patient ID: Amber Decker, female    DOB: 28-Nov-1955, 62 y.o.   MRN: [id number]  HPI  Patient here for a scheduled follow up.  She reports she is doing relatively well.  States am sugars averaging 125-145 and pm sugars 160.  She has started walking.  Has adjusted her diet.  Discussed diet and exercise.  No chest pain.  No increased heart rate or palpitations.  Breathing stable.  No acid reflux.  No abdominal pain.  Bowels moving.  Discussed her mammogram results.  Discussed options.  She prefers 6 month f/u mammogram.  Persistent right leg lesion.  Request referral to dermatology.     Past Medical History:  Diagnosis Date  . Diabetes mellitus without complication ([place]) 1448  . Herpes   . Hypertension   . Myasthenia gravis [facility])    onset age 52's?  . SVT (supraventricular tachycardia) ([place])    Past Surgical History:  Procedure Laterality Date  . MYOMECTOMY    . UTERINE FIBROID SURGERY     at [facility] History  Problem Relation Age of Onset  . Diabetes Mother   . Alcohol abuse Father   . Heart disease Father        myocardial infarction  . Breast cancer Neg Hx   . Colon cancer Neg Hx    Social History   Socioeconomic History  . Marital status: Divorced    Spouse name: None  . Number of children: 0  . Years of education: None  . Highest education level: None  Social Needs  . Financial resource strain: None  . Food insecurity - worry: None  . Food insecurity - inability: None  . Transportation needs - medical: None  . Transportation needs - non-medical: None  Occupational History    Employer: OTHER  Tobacco Use  . Smoking status: Never Smoker  . Smokeless tobacco: Never Used  Substance and Sexual Activity  . Alcohol use: Yes    Alcohol/week: 0.0 oz    Comment: occasionally  . Drug use: No  . Sexual activity: None  Other Topics Concern  . None  Social History  Narrative  . None    Outpatient Encounter Medications as of 04/21/2017  Medication Sig  . acyclovir (ZOVIRAX) 400 MG tablet Take 1 tablet (400 mg total) daily by mouth. (Patient taking differently: Take 400 mg by mouth daily. As needed, every few months)  . aspirin EC 81 MG tablet Take 81 mg by mouth daily.  . Biotin 1000 MCG tablet Take 1,000 mcg by mouth daily.  [name] Calcium (STOOL SOFTENER PO) Take 400 mg by mouth daily as needed.   . empagliflozin (JARDIANCE) 10 MG TABS tablet Take 10 mg by mouth daily.  [name] lisinopril-hydrochlorothiazide (PRINZIDE,ZESTORETIC) 10-12.5 MG tablet TAKE ONE TABLET BY MOUTH ONCE DAILY  . metformin (FORTAMET) 1000 MG (OSM) 24 hr tablet Take 2 tablets (2,000 mg total) by mouth daily with breakfast.  . metoprolol tartrate (LOPRESSOR) 25 MG tablet TAKE ONE TABLET BY MOUTH TWICE DAILY  . Multiple Vitamins-Minerals (ALIVE WOMENS 50+ PO) Take by mouth every other day.   . Omega-3 Fatty Acids (FISH OIL) 500 MG CAPS Take 1 capsule by mouth daily.   . ranitidine (ZANTAC) 150 MG tablet Take 150 mg by mouth as needed for heartburn.   No facility-administered encounter medications on file as of 04/21/2017.  Review of Systems  Constitutional: Negative for appetite change and unexpected weight change.  HENT: Negative for congestion and sinus pressure.   Respiratory: Negative for cough, chest tightness and shortness of breath.   Cardiovascular: Negative for chest pain, palpitations and leg swelling.  Gastrointestinal: Negative for abdominal pain, diarrhea, nausea and vomiting.  Genitourinary: Negative for difficulty urinating and dysuria.  Musculoskeletal: Negative for joint swelling and myalgias.  Skin: Negative for color change and rash.  Neurological: Negative for dizziness, light-headedness and headaches.  Psychiatric/Behavioral: Negative for agitation and dysphoric mood.       Objective:     Blood pressure rechecked by me:  128/80  Physical Exam    Constitutional: She appears well-developed and well-nourished. No distress.  HENT:  Nose: Nose normal.  Mouth/Throat: Oropharynx is clear and moist.  Neck: Neck supple. No thyromegaly present.  Cardiovascular: Normal rate and regular rhythm.  Pulmonary/Chest: Breath sounds normal. No respiratory distress. She has no wheezes.  Abdominal: Soft. Bowel sounds are normal. There is no tenderness.  Musculoskeletal: She exhibits no edema or tenderness.  Lymphadenopathy:    She has no cervical adenopathy.  Skin: No rash noted. No erythema.  Psychiatric: She has a normal mood and affect. Her behavior is normal.    BP 120/74 (BP Location: Left Arm, Patient Position: Sitting, Cuff Size: Large)   Pulse 76   Temp 98.2 F (36.8 C) (Oral)   Resp 16   Wt 277 lb 6.4 oz (125.8 kg)   LMP 02/21/2012   SpO2 98%   BMI 40.96 kg/m  Wt Readings from Last 3 Encounters:  04/21/17 277 lb 6.4 oz (125.8 kg)  02/23/17 276 lb (125.2 kg)  01/15/17 275 lb (124.7 kg)     Lab Results  Component Value Date   WBC 3.8 (L) 01/29/2017   HGB 13.7 01/29/2017   HCT 40.4 01/29/2017   PLT 202.0 01/29/2017   GLUCOSE 199 (H) 01/29/2017   CHOL 179 01/29/2017   TRIG 66.0 01/29/2017   HDL 69.80 01/29/2017   LDLCALC 96 01/29/2017   ALT 12 01/29/2017   AST 14 01/29/2017   NA 138 01/29/2017   K 4.2 01/29/2017   CL 104 01/29/2017   CREATININE 0.81 01/29/2017   BUN 20 01/29/2017   CO2 28 01/29/2017   TSH 2.26 07/11/2016   HGBA1C 8.6 (H) 01/29/2017   MICROALBUR 0.7 10/09/2015    Us Breast Ltd Uni Left Inc Axilla  Result Date: 12/18/2016 CLINICAL DATA:  62-year-old patient recalled from recent screening mammogram for evaluation of a possible mass in the left breast. EXAM: 2D DIGITAL DIAGNOSTIC LEFT MAMMOGRAM WITH CAD AND ADJUNCT TOMO ULTRASOUND LEFT BREAST COMPARISON:  12/02/2016 and multiple earlier prior screening mammograms. ACR Breast Density Category b: There are scattered areas of fibroglandular density.  FINDINGS: Additional mammographic views of the outer left breast from confirm a new 8 mm low-density oval mass with partially circumscribed and partially indistinct margins well seen on the MLO spot compression tomographic views on slice 34 and on the whole breast CC tomo graphic views, slice number 33. Some skin moles were identified on the lower outer left breast and skin markers were placed. This mass is not accounted for by a skin mole. Mammographic images were processed with CAD. On physical exam, no mass is palpated in the outer left breast. Targeted ultrasound is performed, showing normal tissue in the upper outer and lower outer quadrant of the left breast. No definite sonographic correlate to explain the new nodule identified by   mammography. No suspicious findings are seen on ultrasound. IMPRESSION: New 8 mm mass in the outer left breast identified on the screening mammogram. No sonographic correlate is detected. Malignancy cannot be excluded. RECOMMENDATION: Attempt at stereotactic biopsy of the new mass is recommended and will be scheduled for the patient. The procedure for stereotactic biopsy was discussed with the patient in detail today. I have discussed the findings and recommendations with the patient. Results were also provided in writing at the conclusion of the visit. If applicable, a reminder letter will be sent to the patient regarding the next appointment. BI-RADS CATEGORY  4: Suspicious. Electronically Signed   By: Susan  Turner M.D.   On: 12/18/2016 16:43   Mm Diag Breast Tomo Uni Left  Result Date: 12/18/2016 CLINICAL DATA:  62-year-old patient recalled from recent screening mammogram for evaluation of a possible mass in the left breast. EXAM: 2D DIGITAL DIAGNOSTIC LEFT MAMMOGRAM WITH CAD AND ADJUNCT TOMO ULTRASOUND LEFT BREAST COMPARISON:  12/02/2016 and multiple earlier prior screening mammograms. ACR Breast Density Category b: There are scattered areas of fibroglandular density.  FINDINGS: Additional mammographic views of the outer left breast from confirm a new 8 mm low-density oval mass with partially circumscribed and partially indistinct margins well seen on the MLO spot compression tomographic views on slice 34 and on the whole breast CC tomo graphic views, slice number 33. Some skin moles were identified on the lower outer left breast and skin markers were placed. This mass is not accounted for by a skin mole. Mammographic images were processed with CAD. On physical exam, no mass is palpated in the outer left breast. Targeted ultrasound is performed, showing normal tissue in the upper outer and lower outer quadrant of the left breast. No definite sonographic correlate to explain the new nodule identified by mammography. No suspicious findings are seen on ultrasound. IMPRESSION: New 8 mm mass in the outer left breast identified on the screening mammogram. No sonographic correlate is detected. Malignancy cannot be excluded. RECOMMENDATION: Attempt at stereotactic biopsy of the new mass is recommended and will be scheduled for the patient. The procedure for stereotactic biopsy was discussed with the patient in detail today. I have discussed the findings and recommendations with the patient. Results were also provided in writing at the conclusion of the visit. If applicable, a reminder letter will be sent to the patient regarding the next appointment. BI-RADS CATEGORY  4: Suspicious. Electronically Signed   By: Susan  Turner M.D.   On: 12/18/2016 16:43       Assessment & Plan:   Problem List Items Addressed This Visit    BMI 40.0-44.9, adult (HCC)    Discussed diet and exercise.  Follow.        Diabetes (HCC)    Low carb diet and exercise.  Sugars as outlined.  Follow met b and a1c.        Relevant Orders   Lipid panel   Hemoglobin A1c   Hepatic function panel   Basic metabolic panel   Microalbumin / creatinine urine ratio   Essential hypertension, benign    Blood  pressure under good control.  Continue same medication regimen.  Follow pressures.  Follow metabolic panel.        Relevant Orders   TSH   Mass of upper outer quadrant of left breast    Recent mammogram - Birads IV.  Saw surgery.  Discussed biopsy.  I discussed with her today.  She declines biopsy.  Wants to f/u with   6 month mammogram.        Myasthenia gravis ([place])    Stable.       SVT (supraventricular tachycardia) (HCC)    On metoprolol.  Doing well.  Currently asymptomatic.         Other Visit Diagnoses    Abnormal mammogram    -  Primary   Relevant Orders   MM DIAG BREAST TOMO UNI LEFT   US BREAST LTD UNI LEFT INC AXILLA   Leg skin lesion, right       persistent.  refer to dermatology.     Relevant Orders   Ambulatory referral to Dermatology   Leukopenia, unspecified type       Relevant Orders   CBC with Differential/Platelet       Einar Pheasant, MD

## 2017-04-25 ENCOUNTER — Encounter: Payer: Self-pay | Admitting: Internal Medicine

## 2017-04-25 NOTE — Assessment & Plan Note (Signed)
Recent mammogram - Birads IV.  Saw surgery.  Discussed biopsy.  I discussed with her today.  She declines biopsy.  Wants to f/u with 6 month mammogram.

## 2017-04-25 NOTE — Assessment & Plan Note (Signed)
Low carb diet and exercise.  Sugars as outlined.  Follow met b and a1c.   

## 2017-04-25 NOTE — Assessment & Plan Note (Signed)
Discussed diet and exercise.  Follow.  

## 2017-04-25 NOTE — Assessment & Plan Note (Signed)
Stable

## 2017-04-25 NOTE — Assessment & Plan Note (Signed)
On metoprolol.  Doing well.  Currently asymptomatic.

## 2017-04-25 NOTE — Assessment & Plan Note (Signed)
Blood pressure under good control.  Continue same medication regimen.  Follow pressures.  Follow metabolic panel.   

## 2017-05-19 ENCOUNTER — Other Ambulatory Visit: Payer: BLUE CROSS/BLUE SHIELD

## 2017-06-16 ENCOUNTER — Other Ambulatory Visit: Payer: BLUE CROSS/BLUE SHIELD

## 2017-06-23 ENCOUNTER — Ambulatory Visit: Payer: BLUE CROSS/BLUE SHIELD | Admitting: Internal Medicine

## 2017-07-03 ENCOUNTER — Ambulatory Visit
Admission: RE | Admit: 2017-07-03 | Discharge: 2017-07-03 | Disposition: A | Discharge status: Home / Self Care | Payer: BLUE CROSS/BLUE SHIELD | Source: Ambulatory Visit | Attending: Internal Medicine | Admitting: Internal Medicine

## 2017-07-03 DIAGNOSIS — R928 Other abnormal and inconclusive findings on diagnostic imaging of breast: Secondary | ICD-10-CM

## 2017-07-07 ENCOUNTER — Other Ambulatory Visit: Payer: Self-pay | Admitting: Internal Medicine

## 2017-07-07 DIAGNOSIS — R928 Other abnormal and inconclusive findings on diagnostic imaging of breast: Secondary | ICD-10-CM

## 2017-07-07 NOTE — Progress Notes (Signed)
Order placed for f/u diagnostic mammogram and left breast ultrasound.

## 2017-07-27 ENCOUNTER — Other Ambulatory Visit: Payer: BLUE CROSS/BLUE SHIELD

## 2017-07-28 ENCOUNTER — Other Ambulatory Visit (INDEPENDENT_AMBULATORY_CARE_PROVIDER_SITE_OTHER): Payer: BLUE CROSS/BLUE SHIELD

## 2017-07-28 DIAGNOSIS — E119 Type 2 diabetes mellitus without complications: Secondary | ICD-10-CM

## 2017-07-28 DIAGNOSIS — D72819 Decreased white blood cell count, unspecified: Secondary | ICD-10-CM | POA: Diagnosis not present

## 2017-07-28 DIAGNOSIS — I10 Essential (primary) hypertension: Secondary | ICD-10-CM

## 2017-07-28 LAB — LIPID PANEL
Cholesterol: 198 mg/dL (ref 0–200)
HDL: 76.9 mg/dL (ref >39.00)
LDL Cholesterol: 103 mg/dL — ABNORMAL HIGH (ref 0–99)
NonHDL: 120.93
Total CHOL/HDL Ratio: 3
Triglycerides: 89 mg/dL (ref 0.0–149.0)
VLDL: 17.8 mg/dL (ref 0.0–40.0)

## 2017-07-28 LAB — MICROALBUMIN / CREATININE URINE RATIO
Creatinine,U: 133.4 mg/dL
Microalb Creat Ratio: 0.6 mg/g (ref 0.0–30.0)
Microalb, Ur: 0.8 mg/dL (ref 0.0–1.9)

## 2017-07-28 LAB — HEPATIC FUNCTION PANEL
ALT: 19 U/L (ref 0–35)
AST: 17 U/L (ref 0–37)
Albumin: 4.2 g/dL (ref 3.5–5.2)
Alkaline Phosphatase: 51 U/L (ref 39–117)
Bilirubin, Direct: 0.1 mg/dL (ref 0.0–0.3)
Total Bilirubin: 0.5 mg/dL (ref 0.2–1.2)
Total Protein: 7.3 g/dL (ref 6.0–8.3)

## 2017-07-28 LAB — CBC WITH DIFFERENTIAL/PLATELET
Basophils Absolute: 0 10*3/uL (ref 0.0–0.1)
Basophils Relative: 0.8 % (ref 0.0–3.0)
Eosinophils Absolute: 0.1 10*3/uL (ref 0.0–0.7)
Eosinophils Relative: 4.1 % (ref 0.0–5.0)
HCT: 38.4 % (ref 36.0–46.0)
Hemoglobin: 13.2 g/dL (ref 12.0–15.0)
Lymphocytes Relative: 50.2 % — ABNORMAL HIGH (ref 12.0–46.0)
Lymphs Abs: 1.6 10*3/uL (ref 0.7–4.0)
MCHC: 34.3 g/dL (ref 30.0–36.0)
MCV: 90.2 fl (ref 78.0–100.0)
Monocytes Absolute: 0.3 10*3/uL (ref 0.1–1.0)
Monocytes Relative: 10 % (ref 3.0–12.0)
Neutro Abs: 1.1 10*3/uL — ABNORMAL LOW (ref 1.4–7.7)
Neutrophils Relative %: 34.9 % — ABNORMAL LOW (ref 43.0–77.0)
Platelets: 187 10*3/uL (ref 150.0–400.0)
RBC: 4.26 Mil/uL (ref 3.87–5.11)
RDW: 13.7 % (ref 11.5–15.5)
WBC: 3.2 10*3/uL — ABNORMAL LOW (ref 4.0–10.5)

## 2017-07-28 LAB — BASIC METABOLIC PANEL
BUN: 16 mg/dL (ref 6–23)
CO2: 28 mEq/L (ref 19–32)
Calcium: 9.5 mg/dL (ref 8.4–10.5)
Chloride: 104 mEq/L (ref 96–112)
Creatinine, Ser: 0.78 mg/dL (ref 0.40–1.20)
GFR: 96.55 mL/min (ref >60.00)
Glucose, Bld: 197 mg/dL — ABNORMAL HIGH (ref 70–99)
Potassium: 4.3 mEq/L (ref 3.5–5.1)
Sodium: 138 mEq/L (ref 135–145)

## 2017-07-28 LAB — TSH: TSH: 2 u[IU]/mL (ref 0.35–4.50)

## 2017-07-28 LAB — HEMOGLOBIN A1C: Hgb A1c MFr Bld: 8.4 % — ABNORMAL HIGH (ref 4.6–6.5)

## 2017-08-04 ENCOUNTER — Encounter: Payer: Self-pay | Admitting: Internal Medicine

## 2017-08-04 ENCOUNTER — Ambulatory Visit (INDEPENDENT_AMBULATORY_CARE_PROVIDER_SITE_OTHER): Payer: BLUE CROSS/BLUE SHIELD | Admitting: Internal Medicine

## 2017-08-04 VITALS — BP 128/76 | HR 75 | Temp 97.9°F | Resp 18 | Ht 69.0 in | Wt 278.0 lb

## 2017-08-04 DIAGNOSIS — D72819 Decreased white blood cell count, unspecified: Secondary | ICD-10-CM

## 2017-08-04 DIAGNOSIS — E119 Type 2 diabetes mellitus without complications: Secondary | ICD-10-CM

## 2017-08-04 DIAGNOSIS — I471 Supraventricular tachycardia: Secondary | ICD-10-CM | POA: Diagnosis not present

## 2017-08-04 DIAGNOSIS — G7000 Myasthenia gravis without (acute) exacerbation: Secondary | ICD-10-CM

## 2017-08-04 DIAGNOSIS — B977 Papillomavirus as the cause of diseases classified elsewhere: Secondary | ICD-10-CM

## 2017-08-04 DIAGNOSIS — I10 Essential (primary) hypertension: Secondary | ICD-10-CM

## 2017-08-04 DIAGNOSIS — Z0000 Encounter for general adult medical examination without abnormal findings: Secondary | ICD-10-CM

## 2017-08-04 DIAGNOSIS — Z6841 Body Mass Index (BMI) 40.0 and over, adult: Secondary | ICD-10-CM | POA: Diagnosis not present

## 2017-08-04 MED ORDER — LISINOPRIL-HYDROCHLOROTHIAZIDE 10-12.5 MG PO TABS
1.0000 | ORAL_TABLET | Freq: Every day | ORAL | 1 refills | Status: DC
Start: 2017-08-04 — End: 2018-01-25

## 2017-08-04 NOTE — Progress Notes (Addendum)
Patient ID: Amber Decker, female   DOB: 05-May-1955, 62 y.o.   MRN: [id number]   Subjective:    Patient ID: Amber Decker, female    DOB: [birth year], 62 y.o.   MRN: [id number]  HPI  Patient here for her physical exam.  She reports she is doing relatively well.  Has not been watching her diet.  Not exercising regularly.  Discussed her recent labs.  Discussed elevated a1c.  Discussed diet and exercise.  Discussed adding medication.  She declines.  No chest pain.  No sob. No acid reflux. No abdominal pain.  Bowels moving.  Blood sugars averaging 165-190.  Just had mammogram 07/03/17 - Birads III.  Due f/u mammogram 11/2017.     Past Medical History:  Diagnosis Date  . Diabetes mellitus without complication (HCC) 2008  . Herpes   . Hypertension   . Myasthenia gravis [facility])    onset age 32's?  . SVT (supraventricular tachycardia) (HCC)    Past Surgical History:  Procedure Laterality Date  . MYOMECTOMY    . UTERINE FIBROID SURGERY     at [facility] History  Problem Relation Age of Onset  . Diabetes Mother   . Alcohol abuse Father   . Heart disease Father        myocardial infarction  . Breast cancer Neg Hx   . Colon cancer Neg Hx    Social History   Socioeconomic History  . Marital status: Divorced    Spouse name: Not on file  . Number of children: 0  . Years of education: Not on file  . Highest education level: Not on file  Occupational History    Employer: OTHER  Social Needs  . Financial resource strain: Not on file  . Food insecurity:    Worry: Not on file    Inability: Not on file  . Transportation needs:    Medical: Not on file    Non-medical: Not on file  Tobacco Use  . Smoking status: Never Smoker  . Smokeless tobacco: Never Used  Substance and Sexual Activity  . Alcohol use: Yes    Alcohol/week: 0.0 oz    Comment: occasionally  . Drug use: No  . Sexual activity: Not on file  Lifestyle  . Physical activity:    Days per week: Not on file    Minutes  per session: Not on file  . Stress: Not on file  Relationships  . Social connections:    Talks on phone: Not on file    Gets together: Not on file    Attends religious service: Not on file    Active member of club or organization: Not on file    Attends meetings of clubs or organizations: Not on file    Relationship status: Not on file  Other Topics Concern  . Not on file  Social History Narrative  . Not on file    Outpatient Encounter Medications as of 08/04/2017  Medication Sig  . acyclovir (ZOVIRAX) 400 MG tablet Take 1 tablet (400 mg total) daily by mouth. (Patient taking differently: Take 400 mg by mouth daily. As needed, every few months)  . aspirin EC 81 MG tablet Take 81 mg by mouth daily.  . Biotin 1000 MCG tablet Take 1,000 mcg by mouth daily.  Tery Sanfilippo Calcium (STOOL SOFTENER PO) Take 400 mg by mouth daily as needed.   . empagliflozin (JARDIANCE) 10 MG TABS tablet Take 10 mg by mouth daily.  [name] lisinopril-hydrochlorothiazide (PRINZIDE,ZESTORETIC)  10-12.5 MG tablet Take 1 tablet by mouth daily.  . metformin (FORTAMET) 1000 MG (OSM) 24 hr tablet Take 2 tablets (2,000 mg total) by mouth daily with breakfast.  . metoprolol tartrate (LOPRESSOR) 25 MG tablet TAKE ONE TABLET BY MOUTH TWICE DAILY  . Multiple Vitamins-Minerals (ALIVE WOMENS 50+ PO) Take by mouth every other day.   . Omega-3 Fatty Acids (FISH OIL) 500 MG CAPS Take 1 capsule by mouth daily.   . ranitidine (ZANTAC) 150 MG tablet Take 150 mg by mouth as needed for heartburn.  . [DISCONTINUED] lisinopril-hydrochlorothiazide (PRINZIDE,ZESTORETIC) 10-12.5 MG tablet TAKE ONE TABLET BY MOUTH ONCE DAILY   No facility-administered encounter medications on file as of 08/04/2017.     Review of Systems  Constitutional: Negative for appetite change and unexpected weight change.  HENT: Negative for congestion and sinus pressure.   Eyes: Negative for pain and visual disturbance.  Respiratory: Negative for cough, chest tightness  and shortness of breath.   Cardiovascular: Negative for chest pain, palpitations and leg swelling.  Gastrointestinal: Negative for abdominal pain, diarrhea, nausea and vomiting.  Genitourinary: Negative for difficulty urinating and dysuria.  Musculoskeletal: Negative for joint swelling and myalgias.  Skin: Negative for color change and rash.  Neurological: Negative for dizziness, light-headedness and headaches.  Hematological: Negative for adenopathy. Does not bruise/bleed easily.  Psychiatric/Behavioral: Negative for agitation and dysphoric mood.       Objective:     Blood pressure rechecked by me:  120/72  Physical Exam  Constitutional: She is oriented to person, place, and time. She appears well-developed and well-nourished. No distress.  HENT:  Nose: Nose normal.  Mouth/Throat: Oropharynx is clear and moist.  Eyes: Right eye exhibits no discharge. Left eye exhibits no discharge. No scleral icterus.  Neck: Neck supple. No thyromegaly present.  Cardiovascular: Normal rate and regular rhythm.  Pulmonary/Chest: Breath sounds normal. No accessory muscle usage. No tachypnea. No respiratory distress. She has no decreased breath sounds. She has no wheezes. She has no rhonchi. Right breast exhibits no inverted nipple, no mass, no nipple discharge and no tenderness (no axillary adenopathy). Left breast exhibits no inverted nipple, no mass, no nipple discharge and no tenderness (no axilarry adenopathy).  Abdominal: Soft. Bowel sounds are normal. There is no tenderness.  Musculoskeletal: She exhibits no edema or tenderness.  Lymphadenopathy:    She has no cervical adenopathy.  Neurological: She is alert and oriented to person, place, and time.  Skin: No rash noted. No erythema.  Psychiatric: She has a normal mood and affect. Her behavior is normal.    BP 128/76 (BP Location: Left Arm, Patient Position: Sitting, Cuff Size: Large)   Pulse 75   Temp 97.9 F (36.6 C) (Oral)   Resp 18   Ht  5\' 9"  (1.753 m)   Wt 278 lb (126.1 kg)   LMP 02/21/2012   SpO2 98%   BMI 41.05 kg/m  Wt Readings from Last 3 Encounters:  08/04/17 278 lb (126.1 kg)  04/21/17 277 lb 6.4 oz (125.8 kg)  02/23/17 276 lb (125.2 kg)     Lab Results  Component Value Date   WBC 3.2 (L) 07/28/2017   HGB 13.2 07/28/2017   HCT 38.4 07/28/2017   PLT 187.0 07/28/2017   GLUCOSE 197 (H) 07/28/2017   CHOL 198 07/28/2017   TRIG 89.0 07/28/2017   HDL 76.90 07/28/2017   LDLCALC 103 (H) 07/28/2017   ALT 19 07/28/2017   AST 17 07/28/2017   NA 138 07/28/2017   K  4.3 07/28/2017   CL 104 07/28/2017   CREATININE 0.78 07/28/2017   BUN 16 07/28/2017   CO2 28 07/28/2017   TSH 2.00 07/28/2017   HGBA1C 8.4 (H) 07/28/2017   MICROALBUR 0.8 07/28/2017    [place] Breast Ltd Uni Left Inc Axilla  Result Date: 07/03/2017 CLINICAL DATA:  Follow-up of probably benign left lateral breast nodule. EXAM: DIGITAL DIAGNOSTIC LEFT MAMMOGRAM WITH CAD AND TOMO ULTRASOUND LEFT BREAST COMPARISON:  Previous exam(s). ACR Breast Density Category b: There are scattered areas of fibroglandular density. FINDINGS: Mammographically, there is a stable benign-appearing circumscribed elongated nodule in the lower outer left breast, middle depth. Benign-appearing scattered calcifications are seen in the left breast upper outer quadrant. Mammographic images were processed with CAD. On physical exam, no suspicious masses are palpated. Targeted ultrasound is performed, showing no suspicious masses or shadowing lesions in the left breast upper outer quadrant or lower outer quadrant. IMPRESSION: Stable probably benign mammographically seen nodule in the left breast lower outer quadrant. RECOMMENDATION: Bilateral diagnostic mammogram and focused left breast ultrasound, if found necessary in October 2019. I have discussed the findings and recommendations with the patient. Results were also provided in writing at the conclusion of the visit. If applicable, a  reminder letter will be sent to the patient regarding the next appointment. BI-RADS CATEGORY  3: Probably benign. Electronically Signed   By: Ted Mcalpineobrinka  Dimitrova M.D.   On: 07/03/2017 15:57       Assessment & Plan:   Problem List Items Addressed This Visit    BMI 40.0-44.9, adult (HCC)    Discussed diet and exercise.  Follow.        Diabetes (HCC)    Low carb diet and exercise.  a1c elevated.  Discussed diet and exercise.  Discussed adjusting medication.  She declines.  Wants to work on diet and exercise before changing medication.  Follow.        Relevant Medications   lisinopril-hydrochlorothiazide (PRINZIDE,ZESTORETIC) 10-12.5 MG tablet   Other Relevant Orders   Hemoglobin A1c   Hepatic function panel   Lipid panel   Essential hypertension, benign    Blood pressure under good control.  Continue same medication regimen.  Follow pressures.  Follow metabolic panel.        Relevant Medications   lisinopril-hydrochlorothiazide (PRINZIDE,ZESTORETIC) 10-12.5 MG tablet   Other Relevant Orders   CBC with Differential/Platelet   Basic metabolic panel   Health care maintenance    Physical today 08/04/17.  PAP 12/18/16 - negative with negative HPV.  Discussed f/u pap today.  She wants to wait until next visit.   Mammogram 07/03/17 - Birads III.  Due f/u 11/2017.        HPV in female    F/u pap next visit.        Leukopenia    Stable, but decreased.  Follow cbc.       Myasthenia gravis (HCC)    Stable.        SVT (supraventricular tachycardia) (HCC)    Doing well.  On metoprolol.        Relevant Medications   lisinopril-hydrochlorothiazide (PRINZIDE,ZESTORETIC) 10-12.5 MG tablet    Other Visit Diagnoses    Routine general medical examination at a health care facility    -  Primary       Dale DurhamSCOTT, Keats Kingry, MD

## 2017-08-04 NOTE — Assessment & Plan Note (Addendum)
Physical today 08/04/17.  PAP 12/18/16 - negative with negative HPV.  Discussed f/u pap today.  She wants to wait until next visit.   Mammogram 07/03/17 - Birads III.  Due f/u 11/2017.

## 2017-08-08 ENCOUNTER — Encounter: Payer: Self-pay | Admitting: Internal Medicine

## 2017-08-08 DIAGNOSIS — D72819 Decreased white blood cell count, unspecified: Secondary | ICD-10-CM | POA: Insufficient documentation

## 2017-08-08 NOTE — Assessment & Plan Note (Signed)
Low carb diet and exercise.  a1c elevated.  Discussed diet and exercise.  Discussed adjusting medication.  She declines.  Wants to work on diet and exercise before changing medication.  Follow.

## 2017-08-08 NOTE — Assessment & Plan Note (Signed)
Doing well. On metoprolol.

## 2017-08-08 NOTE — Assessment & Plan Note (Signed)
F/u pap next visit.

## 2017-08-08 NOTE — Assessment & Plan Note (Signed)
Discussed diet and exercise.  Follow.  

## 2017-08-08 NOTE — Assessment & Plan Note (Signed)
Stable, but decreased.  Follow cbc.

## 2017-08-08 NOTE — Assessment & Plan Note (Signed)
Blood pressure under good control.  Continue same medication regimen.  Follow pressures.  Follow metabolic panel.   

## 2017-08-08 NOTE — Assessment & Plan Note (Signed)
Stable

## 2017-08-10 ENCOUNTER — Ambulatory Visit: Payer: BLUE CROSS/BLUE SHIELD | Admitting: Pharmacist

## 2017-09-05 ENCOUNTER — Other Ambulatory Visit: Payer: Self-pay | Admitting: Internal Medicine

## 2017-10-31 ENCOUNTER — Other Ambulatory Visit: Payer: Self-pay | Admitting: Internal Medicine

## 2017-11-06 ENCOUNTER — Other Ambulatory Visit: Payer: Self-pay | Admitting: Internal Medicine

## 2017-11-06 NOTE — Telephone Encounter (Signed)
Lopressoe 25 mg refill Last Refill:11/02/17 # 180 Last OV: 08/04/17 PCP: 08/04/17 Pharmacy:[id number]

## 2017-11-06 NOTE — Telephone Encounter (Signed)
Copied from CRM [id number]. Topic: Quick Communication - Rx Refill/Question >> Nov 06, 2017  9:19 AM Tamela Oddi wrote: Medication: metoprolol tartrate (LOPRESSOR) 25 MG tablet  Patient called to request a refill for the above medication.  CB# [phone]  Preferred Pharmacy (with phone number or street name): [facility] [address]), [place] - [address] [phone] (Phone) [phone] (Fax)

## 2017-11-06 NOTE — Addendum Note (Signed)
Addended by: Stevphen Meuse on: 11/06/2017 10:15 AM   Modules accepted: Orders

## 2017-11-27 ENCOUNTER — Other Ambulatory Visit (INDEPENDENT_AMBULATORY_CARE_PROVIDER_SITE_OTHER): Payer: BLUE CROSS/BLUE SHIELD

## 2017-11-27 DIAGNOSIS — E119 Type 2 diabetes mellitus without complications: Secondary | ICD-10-CM | POA: Diagnosis not present

## 2017-11-27 DIAGNOSIS — I10 Essential (primary) hypertension: Secondary | ICD-10-CM

## 2017-11-27 LAB — LIPID PANEL
Cholesterol: 174 mg/dL (ref 0–200)
HDL: 60.7 mg/dL (ref >39.00)
LDL Cholesterol: 96 mg/dL (ref 0–99)
NonHDL: 112.93
Total CHOL/HDL Ratio: 3
Triglycerides: 83 mg/dL (ref 0.0–149.0)
VLDL: 16.6 mg/dL (ref 0.0–40.0)

## 2017-11-27 LAB — BASIC METABOLIC PANEL
BUN: 22 mg/dL (ref 6–23)
CO2: 26 mEq/L (ref 19–32)
Calcium: 9.4 mg/dL (ref 8.4–10.5)
Chloride: 104 mEq/L (ref 96–112)
Creatinine, Ser: 0.86 mg/dL (ref 0.40–1.20)
GFR: 86.17 mL/min (ref >60.00)
Glucose, Bld: 188 mg/dL — ABNORMAL HIGH (ref 70–99)
Potassium: 4.3 mEq/L (ref 3.5–5.1)
Sodium: 140 mEq/L (ref 135–145)

## 2017-11-27 LAB — CBC WITH DIFFERENTIAL/PLATELET
Basophils Absolute: 0 10*3/uL (ref 0.0–0.1)
Basophils Relative: 1.1 % (ref 0.0–3.0)
Eosinophils Absolute: 0.2 10*3/uL (ref 0.0–0.7)
Eosinophils Relative: 4 % (ref 0.0–5.0)
HCT: 38.2 % (ref 36.0–46.0)
Hemoglobin: 13 g/dL (ref 12.0–15.0)
Lymphocytes Relative: 58.2 % — ABNORMAL HIGH (ref 12.0–46.0)
Lymphs Abs: 2.5 10*3/uL (ref 0.7–4.0)
MCHC: 34.1 g/dL (ref 30.0–36.0)
MCV: 88.9 fl (ref 78.0–100.0)
Monocytes Absolute: 0.4 10*3/uL (ref 0.1–1.0)
Monocytes Relative: 9.4 % (ref 3.0–12.0)
Neutro Abs: 1.1 10*3/uL — ABNORMAL LOW (ref 1.4–7.7)
Neutrophils Relative %: 27.3 % — ABNORMAL LOW (ref 43.0–77.0)
Platelets: 179 10*3/uL (ref 150.0–400.0)
RBC: 4.3 Mil/uL (ref 3.87–5.11)
RDW: 13.4 % (ref 11.5–15.5)
WBC: 4.2 10*3/uL (ref 4.0–10.5)

## 2017-11-27 LAB — HEPATIC FUNCTION PANEL
ALT: 15 U/L (ref 0–35)
AST: 13 U/L (ref 0–37)
Albumin: 4.1 g/dL (ref 3.5–5.2)
Alkaline Phosphatase: 48 U/L (ref 39–117)
Bilirubin, Direct: 0.1 mg/dL (ref 0.0–0.3)
Total Bilirubin: 0.4 mg/dL (ref 0.2–1.2)
Total Protein: 7 g/dL (ref 6.0–8.3)

## 2017-11-27 LAB — HEMOGLOBIN A1C: Hgb A1c MFr Bld: 9.5 % — ABNORMAL HIGH (ref 4.6–6.5)

## 2017-11-30 ENCOUNTER — Telehealth: Payer: Self-pay | Admitting: Internal Medicine

## 2017-11-30 NOTE — Telephone Encounter (Signed)
-----   Message from Lourena Simmonds, [facility] sent at 11/30/2017  7:57 AM EDT ----- Regarding: RE: treatment question Dr. Lorin Picket,   I agree, increasing the [place] would be my first step. I would also strongly consider adding a GLP1 due to the benefits of weight loss. If she's concerned about injections, Trulicity and Ozempic are both once weekly options that appear to be covered by her insurance. I would lean more towards Ozempic - there's a bit more weight loss vs Trulicity.   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   [name] ----- Message ----- From: Dale Glenwood, MD Sent: 11/30/2017   5:05 AM EDT To: Lourena Simmonds, [facility] Subject: treatment question                             Her a1c is continuing to increase.  She is overweight.  On metformin and Jardiance.  Money is an issue for her.  She previously saw [place].  I am gong to get her in for follow with you.  I know I can increase the dose of Jardiance, but I would like to get your opinion of what medication you would recommend given increasing a1c?  She has a f/u with me tomorrow.    Thanks   Dr Lorin Picket

## 2017-12-01 ENCOUNTER — Ambulatory Visit: Payer: BLUE CROSS/BLUE SHIELD | Admitting: Internal Medicine

## 2017-12-01 ENCOUNTER — Encounter: Payer: Self-pay | Admitting: Internal Medicine

## 2017-12-01 VITALS — BP 132/88 | HR 74 | Temp 98.2°F | Resp 18 | Wt 278.0 lb

## 2017-12-01 DIAGNOSIS — I10 Essential (primary) hypertension: Secondary | ICD-10-CM

## 2017-12-01 DIAGNOSIS — Z1211 Encounter for screening for malignant neoplasm of colon: Secondary | ICD-10-CM

## 2017-12-01 DIAGNOSIS — D72819 Decreased white blood cell count, unspecified: Secondary | ICD-10-CM

## 2017-12-01 DIAGNOSIS — R0981 Nasal congestion: Secondary | ICD-10-CM | POA: Diagnosis not present

## 2017-12-01 DIAGNOSIS — R928 Other abnormal and inconclusive findings on diagnostic imaging of breast: Secondary | ICD-10-CM | POA: Diagnosis not present

## 2017-12-01 DIAGNOSIS — E119 Type 2 diabetes mellitus without complications: Secondary | ICD-10-CM | POA: Diagnosis not present

## 2017-12-01 DIAGNOSIS — Z6841 Body Mass Index (BMI) 40.0 and over, adult: Secondary | ICD-10-CM

## 2017-12-01 DIAGNOSIS — I471 Supraventricular tachycardia, unspecified: Secondary | ICD-10-CM

## 2017-12-01 DIAGNOSIS — B977 Papillomavirus as the cause of diseases classified elsewhere: Secondary | ICD-10-CM

## 2017-12-01 DIAGNOSIS — K219 Gastro-esophageal reflux disease without esophagitis: Secondary | ICD-10-CM

## 2017-12-01 DIAGNOSIS — G7000 Myasthenia gravis without (acute) exacerbation: Secondary | ICD-10-CM

## 2017-12-01 MED ORDER — EMPAGLIFLOZIN 10 MG PO TABS: 10.0000 mg | ORAL_TABLET | Freq: Every day | ORAL | 2 refills | Status: DC

## 2017-12-01 NOTE — Progress Notes (Signed)
Patient ID: Amber Decker, female   DOB: 1956-02-09, 62 y.o.   MRN: [id number]   Subjective:    Patient ID: Amber Decker, female    DOB: 07/01/1955, 62 y.o.   MRN: [id number]  HPI  Patient here for a scheduled follow up.  She reports some increased nasal congestion.  No sore throat now.  Previous cough and congestion.  Better.  Took some otc nighttime cold medicine.  No chest pain.  No sob.  No acid reflux. No abdominal pain.  Bowels moving.  Discussed labs.  Discussed elevated a1c.  Not checking her sugars.  Not taking her medication.  Did not start Jardiance.  Discussed diet and exercise.  Has not been exercising.  Brought in no recorded sugar readings. No chest pain.  No sob.  No acid reflux.  No abdominal pain.  Bowels moving.  Needs colonoscopy.     Past Medical History:  Diagnosis Date  . Diabetes mellitus without complication (HCC) 2008  . Herpes   . Hypertension   . Myasthenia gravis [facility])    onset age 45's?  . SVT (supraventricular tachycardia) (HCC)    Past Surgical History:  Procedure Laterality Date  . MYOMECTOMY    . UTERINE FIBROID SURGERY     at [facility] History  Problem Relation Age of Onset  . Diabetes Mother   . Alcohol abuse Father   . Heart disease Father        myocardial infarction  . Breast cancer Neg Hx   . Colon cancer Neg Hx    Social History   Socioeconomic History  . Marital status: Divorced    Spouse name: Not on file  . Number of children: 0  . Years of education: Not on file  . Highest education level: Not on file  Occupational History    Employer: OTHER  Social Needs  . Financial resource strain: Not on file  . Food insecurity:    Worry: Not on file    Inability: Not on file  . Transportation needs:    Medical: Not on file    Non-medical: Not on file  Tobacco Use  . Smoking status: Never Smoker  . Smokeless tobacco: Never Used  Substance and Sexual Activity  . Alcohol use: Yes    Alcohol/week: 0.0 standard drinks   Comment: occasionally  . Drug use: No  . Sexual activity: Not on file  Lifestyle  . Physical activity:    Days per week: Not on file    Minutes per session: Not on file  . Stress: Not on file  Relationships  . Social connections:    Talks on phone: Not on file    Gets together: Not on file    Attends religious service: Not on file    Active member of club or organization: Not on file    Attends meetings of clubs or organizations: Not on file    Relationship status: Not on file  Other Topics Concern  . Not on file  Social History Narrative  . Not on file    Outpatient Encounter Medications as of 12/01/2017  Medication Sig  . acyclovir (ZOVIRAX) 400 MG tablet TAKE 1 TABLET (400 MG TOTAL) DAILY BY MOUTH.  [name] aspirin EC 81 MG tablet Take 81 mg by mouth daily.  . Biotin 1000 MCG tablet Take 1,000 mcg by mouth daily.  Tery Sanfilippo Calcium (STOOL SOFTENER PO) Take 400 mg by mouth daily as needed.   . empagliflozin (JARDIANCE)  10 MG TABS tablet Take 10 mg by mouth daily.  [name] lisinopril-hydrochlorothiazide (PRINZIDE,ZESTORETIC) 10-12.5 MG tablet Take 1 tablet by mouth daily.  . metformin (FORTAMET) 1000 MG (OSM) 24 hr tablet Take 2 tablets (2,000 mg total) by mouth daily with breakfast.  . metoprolol tartrate (LOPRESSOR) 25 MG tablet TAKE 1 TABLET BY MOUTH TWICE DAILY  . Multiple Vitamins-Minerals (ALIVE WOMENS 50+ PO) Take by mouth every other day.   . Omega-3 Fatty Acids (FISH OIL) 500 MG CAPS Take 1 capsule by mouth daily.   . ranitidine (ZANTAC) 150 MG tablet Take 150 mg by mouth as needed for heartburn.  . [DISCONTINUED] empagliflozin (JARDIANCE) 10 MG TABS tablet Take 10 mg by mouth daily.   No facility-administered encounter medications on file as of 12/01/2017.     Review of Systems  Constitutional: Negative for appetite change and unexpected weight change.  HENT: Negative for congestion and sinus pressure.   Respiratory: Negative for cough, chest tightness and shortness of  breath.   Cardiovascular: Negative for chest pain, palpitations and leg swelling.  Gastrointestinal: Negative for abdominal pain, diarrhea and nausea.  Genitourinary: Negative for difficulty urinating and dysuria.  Musculoskeletal: Negative for joint swelling and myalgias.  Skin: Negative for color change and rash.  Neurological: Negative for dizziness, light-headedness and headaches.  Psychiatric/Behavioral: Negative for agitation and dysphoric mood.       Objective:    Physical Exam  Constitutional: She appears well-developed and well-nourished. No distress.  HENT:  Nose: Nose normal.  Mouth/Throat: Oropharynx is clear and moist.  Neck: Neck supple. No thyromegaly present.  Cardiovascular: Normal rate and regular rhythm.  Pulmonary/Chest: Breath sounds normal. No respiratory distress. She has no wheezes.  Abdominal: Soft. Bowel sounds are normal. There is no tenderness.  Musculoskeletal: She exhibits no edema or tenderness.  Lymphadenopathy:    She has no cervical adenopathy.  Skin: No rash noted. No erythema.  Psychiatric: She has a normal mood and affect. Her behavior is normal.    BP 132/88 (BP Location: Left Arm, Patient Position: Sitting, Cuff Size: Large)   Pulse 74   Temp 98.2 F (36.8 C) (Oral)   Resp 18   Wt 278 lb (126.1 kg)   LMP 02/21/2012   SpO2 97%   BMI 41.05 kg/m  Wt Readings from Last 3 Encounters:  12/01/17 278 lb (126.1 kg)  08/04/17 278 lb (126.1 kg)  04/21/17 277 lb 6.4 oz (125.8 kg)     Lab Results  Component Value Date   WBC 4.2 11/27/2017   HGB 13.0 11/27/2017   HCT 38.2 11/27/2017   PLT 179.0 11/27/2017   GLUCOSE 188 (H) 11/27/2017   CHOL 174 11/27/2017   TRIG 83.0 11/27/2017   HDL 60.70 11/27/2017   LDLCALC 96 11/27/2017   ALT 15 11/27/2017   AST 13 11/27/2017   NA 140 11/27/2017   K 4.3 11/27/2017   CL 104 11/27/2017   CREATININE 0.86 11/27/2017   BUN 22 11/27/2017   CO2 26 11/27/2017   TSH 2.00 07/28/2017   HGBA1C 9.5 (H)  11/27/2017   MICROALBUR 0.8 07/28/2017    US Breast Ltd Uni Left Inc Axilla  Result Date: 07/03/2017 CLINICAL DATA:  Follow-up of probably benign left lateral breast nodule. EXAM: DIGITAL DIAGNOSTIC LEFT MAMMOGRAM WITH CAD AND TOMO ULTRASOUND LEFT BREAST COMPARISON:  Previous exam(s). ACR Breast Density Category b: There are scattered areas of fibroglandular density. FINDINGS: Mammographically, there is a stable benign-appearing circumscribed elongated nodule in the lower outer left breast,  middle depth. Benign-appearing scattered calcifications are seen in the left breast upper outer quadrant. Mammographic images were processed with CAD. On physical exam, no suspicious masses are palpated. Targeted ultrasound is performed, showing no suspicious masses or shadowing lesions in the left breast upper outer quadrant or lower outer quadrant. IMPRESSION: Stable probably benign mammographically seen nodule in the left breast lower outer quadrant. RECOMMENDATION: Bilateral diagnostic mammogram and focused left breast ultrasound, if found necessary in October 2019. I have discussed the findings and recommendations with the patient. Results were also provided in writing at the conclusion of the visit. If applicable, a reminder letter will be sent to the patient regarding the next appointment. BI-RADS CATEGORY  3: Probably benign. Electronically Signed   By: Ted Mcalpine M.D.   On: 07/03/2017 15:57       Assessment & Plan:   Problem List Items Addressed This Visit    Abnormal mammogram    She declined biopsy.  See last note.  Schedule f/u mammogram and ultrasound.        Relevant Orders   MM DIAG BREAST TOMO BILATERAL   US BREAST LTD UNI RIGHT INC AXILLA   BMI 40.0-44.9, adult (HCC)    Discussed diet and exercise and need for weight loss.        Relevant Medications   empagliflozin (JARDIANCE) 10 MG TABS tablet   Colon cancer screening    Due colonoscopy.  Refer to GI.        Relevant  Orders   Ambulatory referral to Gastroenterology   Diabetes [facility])    Discussed diet and exercise.  Discussed the need to check and record sugars.  Add Jardiance.  Continue metformin.  Follow up with [name].  Discussed need for eye exam.        Relevant Medications   empagliflozin (JARDIANCE) 10 MG TABS tablet   Essential hypertension, benign    Blood pressure has been under good control.  Continue same medication regimen.  Follow pressures.  Follow metabolic panel.        GERD (gastroesophageal reflux disease)    Controlled.        HPV in female    PAP 12/2016 - negative with negative HPV.        Leukopenia    Recheck cbc.       Myasthenia gravis (HCC)    Stable.        SVT (supraventricular tachycardia) (HCC)    Doing well on metoprolol.         Other Visit Diagnoses    Nasal congestion    -  Primary   Nasacort nasal spray and mucinex as directed.  Follow.         Dale Chester, MD

## 2017-12-01 NOTE — Patient Instructions (Signed)
nasacort nasal spray - 2 sprays each nostril one time per day.  Do this in the evening.    Mucinex in the am.  You can take robitussin at night.

## 2017-12-06 ENCOUNTER — Encounter: Payer: Self-pay | Admitting: Internal Medicine

## 2017-12-06 DIAGNOSIS — R928 Other abnormal and inconclusive findings on diagnostic imaging of breast: Secondary | ICD-10-CM | POA: Insufficient documentation

## 2017-12-06 DIAGNOSIS — Z1211 Encounter for screening for malignant neoplasm of colon: Secondary | ICD-10-CM | POA: Insufficient documentation

## 2017-12-06 NOTE — Assessment & Plan Note (Signed)
Controlled.  

## 2017-12-06 NOTE — Assessment & Plan Note (Signed)
She declined biopsy.  See last note.  Schedule f/u mammogram and ultrasound.

## 2017-12-06 NOTE — Assessment & Plan Note (Signed)
Recheck cbc.  

## 2017-12-06 NOTE — Assessment & Plan Note (Signed)
Stable

## 2017-12-06 NOTE — Assessment & Plan Note (Signed)
Discussed diet and exercise.  Discussed the need to check and record sugars.  Add Jardiance.  Continue metformin.  Follow up with [name].  Discussed need for eye exam.

## 2017-12-06 NOTE — Assessment & Plan Note (Signed)
Due colonoscopy.  Refer to GI.  

## 2017-12-06 NOTE — Assessment & Plan Note (Signed)
PAP 12/2016 - negative with negative HPV.

## 2017-12-06 NOTE — Assessment & Plan Note (Addendum)
Blood pressure has been under good control.  Continue same medication regimen.  Follow pressures.  Follow metabolic panel.   

## 2017-12-06 NOTE — Assessment & Plan Note (Signed)
Discussed diet and exercise and need for weight loss.

## 2017-12-06 NOTE — Assessment & Plan Note (Signed)
Doing well on metoprolol

## 2017-12-14 ENCOUNTER — Encounter (INDEPENDENT_AMBULATORY_CARE_PROVIDER_SITE_OTHER): Payer: Self-pay

## 2017-12-14 ENCOUNTER — Ambulatory Visit (INDEPENDENT_AMBULATORY_CARE_PROVIDER_SITE_OTHER): Payer: BLUE CROSS/BLUE SHIELD | Admitting: Pharmacist

## 2017-12-14 ENCOUNTER — Encounter: Payer: Self-pay | Admitting: Pharmacist

## 2017-12-14 DIAGNOSIS — E119 Type 2 diabetes mellitus without complications: Secondary | ICD-10-CM | POA: Diagnosis not present

## 2017-12-14 DIAGNOSIS — I10 Essential (primary) hypertension: Secondary | ICD-10-CM

## 2017-12-14 NOTE — Assessment & Plan Note (Signed)
#  Hypertension currently at goal. Goal BP <130/80 Patient reports adherence with medication. . - Continue lisinopril-HCTZ 10-12.5 mg BID, metoprolol 25 mg BID

## 2017-12-14 NOTE — Patient Instructions (Addendum)
It was great to see you today!   We are going to continue metformin 2000 mg daily and Jardiance 10 mg daily.   I've given you a coupon card to take to your pharmacy to help cover the cost of Jardiance.   Check your blood sugar 1) fasting, before you eat breakfast, and 2) 2 hours after the largest meal of you day. Bring your meter to the office next visit!  Attached is a handout about the "Plate Method". Keep an eye on your carbohydrate servings - we'd like to stay between 30-45 g with meals and no more than 10 g with snacks.    Schedule follow up with me in 3-4 weeks so we can follow up on blood sugars.    Feel free to call clinic with any questions.   [name] Feliz Beam, PharmD

## 2017-12-14 NOTE — Assessment & Plan Note (Addendum)
#  Diabetes - Currently uncontrolled, most recent A1c 9.5% on 11/27/2017, worsened from 8.4% on 07/28/2017; Goal A1c <7%. Patient denies hypoglycemic events. Patient reports adherence with medication. Control is suboptimal due to insulin resistance d/t obesity, lack of physical activity. Per patient report, fasting BG much improved since restarting Jardiance.  - Continue Jardiance 10 mg daily and metformin IR 2000 mg daily to see full benefit of Jardiance at this dose - Moving forward, patient wants to switch to ER metformin once she finishes the 90 day supply of IR that she picked up - Counseled on s/sx genitourinary infections, importance of hydration - Provided extensive counseling on plate method, importance of carbohydrate reduction; discussed the progressive nature of diabetes and benefits of her current pharmacotherapy.  #ASCVD risk - primary prevention in patient aged 96-75 with DM, baseline LDL 70-189; and ASCVD risk score <20%; moderate intensity statin indicated - Continued aspirin 81 mg daily - Moving forward, consider initiation of moderate intensity statin

## 2017-12-14 NOTE — Progress Notes (Addendum)
S:     No chief complaint on file.   Patient arrives in good spirits, ambulating.  Presents for diabetes evaluation, education, and management at the request of Dr. Lorin Picket (referred on 11/27/2017 lab A1c). Last seen by primary care provider on 12/01/2017 - at that time Jardiance was restarted. She previously followed in Pharmacy Clinic, but was lost to follow up.  At this visit, she describes that she goes through cycles of being committed to controlling her diabetes and then cycles of "falling off the wagon". She wants to be able to control her SMBG without additional pharmacotherapy.   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  Current diabetes medications include: metformin 1000 mg BID, Jardiance 10 mg daily. Current hypertension medications include: lisinopril-HCTZ 10-12.5 mg BID, metoprolol 25 mg BID  Patient denies hypoglycemic s/sx including dizziness, shakiness, sweating. Patient reports hyperglycemic symptoms including polydipsia, nocturia, neuropathy.   Patient reported dietary habits: Eats 2-3 meals/day Breakfast: Sausage, egg, and cheese biscuit w/ 1/2 biscuit; occasionally oatmeal;  Lunch: Sometimes eats lunch; sometimes packs  Dinner: Sometimes gets take out, sometimes cooks;  Snacks: Pack of nabs; breakfast biscuits Drinks: Water, occasional diet soda (ginger ale w/ lemonade)   Patient reported exercise habits: Likes walking, but hasn't been lately.     O:  Physical Exam  Constitutional: She appears well-developed and well-nourished.  Vitals reviewed.    Review of Systems  All other systems reviewed and are negative.    Lab Results  Component Value Date   HGBA1C 9.5 (H) 11/27/2017   There were no vitals filed for this visit.  Basic Metabolic Panel BMP Latest  Ref Rng & Units 11/27/2017 07/28/2017 01/29/2017  Glucose 70 - 99 mg/dL 161(W) 960(A) 540(J)  BUN 6 - 23 mg/dL 22 16 20   Creatinine 0.40 - 1.20 mg/dL 8.11 9.14 7.82  Sodium 135 - 145 mEq/L 140 138 138  Potassium 3.5 - 5.1 mEq/L 4.3 4.3 4.2  Chloride 96 - 112 mEq/L 104 104 104  CO2 19 - 32 mEq/L 26 28 28   Calcium 8.4 - 10.5 mg/dL 9.4 9.5 9.1     Lipid Panel     Component Value Date/Time   CHOL 174 11/27/2017 0839   TRIG 83.0 11/27/2017 0839   HDL 60.70 11/27/2017 0839   CHOLHDL 3 11/27/2017 0839   VLDL 16.6 11/27/2017 0839   LDLCALC 96 11/27/2017 0839   Fasting SMBG: 127 - 156 (in the last week since restarting Jardiance)  Clinical ASCVD: No   ASCVD risk factors: age 29-75 10 year ASCVD risk score: 9.5%   A/P: Following discussion and approval by Dr. Lorin Picket, the following medication changes were made:   #Diabetes - Currently uncontrolled, most recent A1c 9.5% on 11/27/2017, worsened from 8.4% on 07/28/2017; Goal A1c <7%. Patient denies hypoglycemic events. Patient reports adherence with medication. Control is suboptimal due to insulin resistance d/t obesity, lack of physical activity. Per patient report, fasting BG much improved since restarting Jardiance.  - Continue Jardiance 10 mg daily and metformin IR 2000 mg daily to see full benefit of Jardiance at this dose - Moving forward, patient wants to switch to ER metformin once she finishes the 90 day supply of IR that she picked up - Counseled on s/sx genitourinary infections, importance of hydration - Provided extensive counseling on plate method, importance of carbohydrate reduction;  discussed the progressive nature of diabetes and benefits of her current pharmacotherapy.  #Hypertension currently at goal. Goal BP <130/80 Patient reports adherence with medication. . - Continue lisinopril-HCTZ 10-12.5 mg BID, metoprolol 25 mg BID  #ASCVD risk - primary prevention in patient aged 43-75 with DM, baseline LDL 70-189; and ASCVD  risk score <20%; moderate intensity statin indicated - Continued aspirin 81 mg daily - Moving forward, consider initiation of moderate intensity statin   Written patient instructions provided.  Total time in face to face counseling 45 minutes.    Follow up in Pharmacist Clinic Visit 3 weeks.   Lourena Simmonds, PharmD PGY2 Ambulatory Care Pharmacy Resident Phone: [phone]   Reviewed above information.  Agree with assessment and plan.    Dr Lorin Picket

## 2017-12-25 ENCOUNTER — Ambulatory Visit
Admission: RE | Admit: 2017-12-25 | Discharge: 2017-12-25 | Disposition: A | Discharge status: Home / Self Care | Payer: BLUE CROSS/BLUE SHIELD | Source: Ambulatory Visit | Attending: Internal Medicine | Admitting: Internal Medicine

## 2017-12-25 ENCOUNTER — Other Ambulatory Visit: Payer: BLUE CROSS/BLUE SHIELD

## 2017-12-25 DIAGNOSIS — R928 Other abnormal and inconclusive findings on diagnostic imaging of breast: Secondary | ICD-10-CM | POA: Insufficient documentation

## 2017-12-28 ENCOUNTER — Ambulatory Visit (INDEPENDENT_AMBULATORY_CARE_PROVIDER_SITE_OTHER): Payer: BLUE CROSS/BLUE SHIELD | Admitting: Pharmacist

## 2017-12-28 ENCOUNTER — Encounter: Payer: Self-pay | Admitting: Pharmacist

## 2017-12-28 DIAGNOSIS — E1165 Type 2 diabetes mellitus with hyperglycemia: Secondary | ICD-10-CM

## 2017-12-28 DIAGNOSIS — I10 Essential (primary) hypertension: Secondary | ICD-10-CM

## 2017-12-28 NOTE — Assessment & Plan Note (Addendum)
#  Diabetes - Currently uncontrolled but much improved per BG readings; most recent A1c 9.5% on 11/27/2017, worsened from 8.4% on 07/28/2017; Goal A1c <7%. BG has made significant improvements since starting Jardiance therapy; appropriate to increase to 25 mg daily today, however, patient would like to continue to work on diet/exercise prior to medication increases - Continue Jardiance 10 mg daily and metformin 1000 mg BID - Provided Healthy Meal Planning handout; extensively discussed plate method and appropriate portion sizes - Recommended continuing to walk 3 days/week for 20 minutes at a time, and increase as able.   #ASCVD risk - primary prevention in patient aged 61-75 with DM, baseline LDL 70-189; and ASCVD risk score <20%; moderate intensity statin indicated - Continued aspirin 81 mg daily - Moving forward, consider initiation of moderate intensity statin. Explained the benefit of statin therapy to patient. She elected to defer statin initiation at this time.

## 2017-12-28 NOTE — Patient Instructions (Signed)
It was great to see you today! You are doing a FANTASTIC job!   We are not going to make any changes today: continue metformin 1000 mg twice daily and Jardiance 10 mg once daily.   Keep checking your blood sugars as you are doing: check fasting blood sugars (goal = less than 130) and 2 hour post meal blood sugars (goal = less than 180)   Keep your follow up with Dr. Lorin PicketScott in January. She will likely check an A1c at that time. If you have any questions or concerns in the meantime, feel free to give us a call.    [name] Feliz Beamravis, PharmD

## 2017-12-28 NOTE — Progress Notes (Addendum)
S:     Chief Complaint  Patient presents with  . Medication Management    Diabetes    Patient arrives in good spirits, ambulating without assistance.  Presents for diabetes evaluation, education, and management at the request of Dr. Lorin PicketScott (referred on 11/27/2017 lab A1c). Last seen by primary care provider on 12/01/2017 - at that time Jardiance was restarted. Last seen in Pharmacy Clinic on 12/14/2017- at that time, we discussed lifestyle modifications to improve BG control.   Today, she notes that she is feeling much better (improvements in polyuria, nocturia). Denies any intolerability concerns with Jardiance. Denies s/sx orthostatic hypotension. She describes making dietary changes to reduce carbohydrate consumption.   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  Current diabetes medications include: metformin 1000 mg BID, Jardiance 10 mg daily. Current hypertension medications include: lisinopril-HCTZ 10-12.5 mg daily, metoprolol 25 mg BID  Patient denies hypoglycemic s/sx including dizziness, shakiness, sweating. Patient denies hyperglycemic symptoms, including improvements in nocturia.   Patient reported exercise habits: Improvements since last visit; last week she walked 2-3 days for 15 minutes; notes that she enjoys walking outside     O:  Physical Exam  Constitutional: She appears well-developed and well-nourished.  Vitals reviewed.    Review of Systems  All other systems reviewed and are negative.    Lab Results  Component Value Date   HGBA1C 9.5 (H) 11/27/2017   Vitals:   12/28/17 1546  BP: 100/69  Pulse: 69    Basic Metabolic Panel BMP Latest Ref Rng & Units 11/27/2017 07/28/2017 01/29/2017  Glucose 70 - 99 mg/dL 161(W188(H) 960(A197(H) 540(J199(H)  BUN 6 - 23 mg/dL 22 16 20    Creatinine 0.40 - 1.20 mg/dL 8.110.86 9.140.78 7.820.81  Sodium 135 - 145 mEq/L 140 138 138  Potassium 3.5 - 5.1 mEq/L 4.3 4.3 4.2  Chloride 96 - 112 mEq/L 104 104 104  CO2 19 - 32 mEq/L 26 28 28   Calcium 8.4 - 10.5 mg/dL 9.4 9.5 9.1     Lipid Panel     Component Value Date/Time   CHOL 174 11/27/2017 0839   TRIG 83.0 11/27/2017 0839   HDL 60.70 11/27/2017 0839   CHOLHDL 3 11/27/2017 0839   VLDL 16.6 11/27/2017 0839   LDLCALC 96 11/27/2017 0839   Fasting SMBG: 111-138 2 hour post prandial - 160, 115   Clinical ASCVD: No   ASCVD risk factors: age 61-75 10 year ASCVD risk score: 7.1%   A/P: Following discussion and approval by Dr. Lorin PicketScott, the following medication changes were made:   #Diabetes - Currently uncontrolled but much improved per BG readings; most recent A1c 9.5% on 11/27/2017, worsened from 8.4% on 07/28/2017; Goal A1c <7%. BG has made significant improvements since starting Jardiance therapy; appropriate to increase to 25 mg daily today, however, patient would like to continue to work on diet/exercise prior to medication increases - Continue Jardiance 10 mg daily and metformin 1000 mg BID - Provided Healthy Meal Planning handout; extensively discussed plate method and appropriate portion sizes - Recommended continuing to walk 3 days/week for 20 minutes at a time, and increase as able.   #Hypertension currently at goal. Goal BP <130/80 Patient reports adherence with medication. She denies any s/sx hypotension - Continue lisinopril-HCTZ 10-12.5 mg daily, metoprolol 25 mg BID. If hypotension moving forward, or if Jardiance dose needs  to be increased for BG control, recommend discontinuation of HCTZ component of antihypertensive regimen  #ASCVD risk - primary prevention in patient aged 36-75 with DM, baseline LDL 70-189; and ASCVD risk score <20%; moderate intensity statin indicated - Continued aspirin 81 mg daily - Moving forward, consider initiation of moderate intensity statin.  Explained the benefit of statin therapy to patient. She elected to defer statin initiation at this time.   Written patient instructions provided.  Total time in face to face counseling 30 minutes.    Follow up in Pharmacist Clinic Visit after PCP appointment.   Lourena Simmonds, PharmD PGY2 Ambulatory Care Pharmacy Resident Phone: [phone]  Reviewed above information.  Agree with assessment and plan.    Dr Lorin Picket

## 2017-12-28 NOTE — Assessment & Plan Note (Signed)
#  Hypertension currently at goal. Goal BP <130/80 Patient reports adherence with medication. She denies any s/sx hypotension - Continue lisinopril-HCTZ 10-12.5 mg daily, metoprolol 25 mg BID. If hypotension moving forward, or if Jardiance dose needs to be increased for BG control, recommend discontinuation of HCTZ component of antihypertensive regimen

## 2018-01-24 ENCOUNTER — Other Ambulatory Visit: Payer: Self-pay | Admitting: Internal Medicine

## 2018-01-25 ENCOUNTER — Other Ambulatory Visit: Payer: Self-pay | Admitting: Internal Medicine

## 2018-01-25 ENCOUNTER — Telehealth: Payer: Self-pay

## 2018-01-25 DIAGNOSIS — E119 Type 2 diabetes mellitus without complications: Secondary | ICD-10-CM

## 2018-01-25 NOTE — Telephone Encounter (Signed)
Copied from CRM [phone]#[id number]. Topic: Quick Communication - Rx Refill/Question >> Jan 25, 2018  4:48 PM Floria RavelingStovall, Shana A wrote: Medication: empagliflozin (JARDIANCE) 10 MG TABS tablet [[id number]][[id number]] - pt would like 90 day supply due to ins  Has the patient contacted their pharmacy? Yes but did not have any refills available  (Agent: If no, request that the patient contact the pharmacy for the refill.) (Agent: If yes, when and what did the pharmacy advise?)  Preferred Pharmacy (with phone number or street name): [facility] [address]   Agent: Please be advised that RX refills may take up to 3 business days. We ask that you follow-up with your pharmacy.

## 2018-01-25 NOTE — Telephone Encounter (Signed)
Copied from CRM [id number]. Topic: General - Other >> Jan 25, 2018  4:52 PM Floria RavelingStovall, Shana A wrote: Pt called in and would like to know if she can get her lab orders put in for her 02/23/2018 appt.  She would like to have labs done before 12/31?     Best number  [phone]

## 2018-01-26 ENCOUNTER — Other Ambulatory Visit: Payer: Self-pay | Admitting: Internal Medicine

## 2018-01-26 DIAGNOSIS — I10 Essential (primary) hypertension: Secondary | ICD-10-CM

## 2018-01-26 DIAGNOSIS — D72819 Decreased white blood cell count, unspecified: Secondary | ICD-10-CM

## 2018-01-26 DIAGNOSIS — E1165 Type 2 diabetes mellitus with hyperglycemia: Secondary | ICD-10-CM

## 2018-01-26 MED ORDER — EMPAGLIFLOZIN 10 MG PO TABS: 10.0000 mg | ORAL_TABLET | Freq: Every day | ORAL | 0 refills | Status: DC

## 2018-01-26 NOTE — Telephone Encounter (Signed)
Called pt. She had labs done on 11/27/2017. She is wanting to get any labs she can get or may need before the end of the year because you will no longer be in network with her insurance. She is still going to keep you as her pcp but will have to pay out of pocket for everything.

## 2018-01-26 NOTE — Telephone Encounter (Signed)
I have placed order for labs (cbc, met b and liver panel).

## 2018-01-26 NOTE — Progress Notes (Signed)
Order placed for labs.

## 2018-01-27 NOTE — Telephone Encounter (Signed)
Called patient to let her know that labs have been ordered. She is supposed to call insurance and see if they will pay for her to have them done before 12/31 and if so, we can schedule lab appt. Pt will call back

## 2018-01-30 ENCOUNTER — Other Ambulatory Visit: Payer: Self-pay | Admitting: Internal Medicine

## 2018-02-06 ENCOUNTER — Other Ambulatory Visit: Payer: Self-pay | Admitting: Internal Medicine

## 2018-02-08 ENCOUNTER — Other Ambulatory Visit: Payer: Self-pay | Admitting: Internal Medicine

## 2018-02-08 DIAGNOSIS — E119 Type 2 diabetes mellitus without complications: Secondary | ICD-10-CM

## 2018-02-23 ENCOUNTER — Ambulatory Visit: Payer: BLUE CROSS/BLUE SHIELD | Admitting: Internal Medicine

## 2018-02-23 ENCOUNTER — Encounter: Payer: Self-pay | Admitting: Internal Medicine

## 2018-02-23 DIAGNOSIS — I10 Essential (primary) hypertension: Secondary | ICD-10-CM

## 2018-02-23 DIAGNOSIS — G7000 Myasthenia gravis without (acute) exacerbation: Secondary | ICD-10-CM | POA: Diagnosis not present

## 2018-02-23 DIAGNOSIS — Z6841 Body Mass Index (BMI) 40.0 and over, adult: Secondary | ICD-10-CM

## 2018-02-23 DIAGNOSIS — I471 Supraventricular tachycardia: Secondary | ICD-10-CM

## 2018-02-23 DIAGNOSIS — E1165 Type 2 diabetes mellitus with hyperglycemia: Secondary | ICD-10-CM

## 2018-02-23 NOTE — Progress Notes (Signed)
Patient ID: Amber Decker, female   DOB: 05/18/1955, 63 y.o.   MRN: [id number]   Subjective:    Patient ID: Amber Decker, female    DOB: 1955/11/21, 64 y.o.   MRN: [id number]  HPI  Patient here for a scheduled follow up.  She reports she is doing relatively well.  Trying to watch her diet.  States sugars are doing better - ranging 118-125.  Discussed exercise and weight loss.  No chest pain.  No increased heart rate or palpitations.  No sob.  No acid reflux.  No abdominal pain.  Bowels moving.  No urine change.  Taking her medication on a regular basis.     Past Medical History:  Diagnosis Date  . Diabetes mellitus without complication ([place]) 9476  . Herpes   . Hypertension   . Myasthenia gravis [facility])    onset age 29's?  . SVT (supraventricular tachycardia) ([place])    Past Surgical History:  Procedure Laterality Date  . MYOMECTOMY    . UTERINE FIBROID SURGERY     at [facility] History  Problem Relation Age of Onset  . Diabetes Mother   . Alcohol abuse Father   . Heart disease Father        myocardial infarction  . Breast cancer Neg Hx   . Colon cancer Neg Hx    Social History   Socioeconomic History  . Marital status: Divorced    Spouse name: Not on file  . Number of children: 0  . Years of education: Not on file  . Highest education level: Not on file  Occupational History    Employer: OTHER  Social Needs  . Financial resource strain: Not on file  . Food insecurity:    Worry: Not on file    Inability: Not on file  . Transportation needs:    Medical: Not on file    Non-medical: Not on file  Tobacco Use  . Smoking status: Never Smoker  . Smokeless tobacco: Never Used  Substance and Sexual Activity  . Alcohol use: Yes    Alcohol/week: 0.0 standard drinks    Comment: occasionally  . Drug use: No  . Sexual activity: Not on file  Lifestyle  . Physical activity:    Days per week: Not on file    Minutes per session: Not on file  . Stress: Not on file    Relationships  . Social connections:    Talks on phone: Not on file    Gets together: Not on file    Attends religious service: Not on file    Active member of club or organization: Not on file    Attends meetings of clubs or organizations: Not on file    Relationship status: Not on file  Other Topics Concern  . Not on file  Social History Narrative  . Not on file    Outpatient Encounter Medications as of 02/23/2018  Medication Sig  . acyclovir (ZOVIRAX) 400 MG tablet TAKE 1 TABLET (400 MG TOTAL) DAILY BY MOUTH.  [name] aspirin EC 81 MG tablet Take 81 mg by mouth daily.  . Biotin 1000 MCG tablet Take 1,000 mcg by mouth daily.  . cyanocobalamin 1000 MCG tablet Take 1,000 mcg by mouth daily.  [name] Calcium (STOOL SOFTENER PO) Take 400 mg by mouth daily as needed.   . empagliflozin (JARDIANCE) 10 MG TABS tablet TAKE 1 TABLET BY MOUTH ONCE DAILY  . lisinopril-hydrochlorothiazide (PRINZIDE,ZESTORETIC) 10-12.5 MG tablet TAKE 1 TABLET  BY MOUTH EVERY DAY  . metFORMIN (GLUCOPHAGE) 1000 MG tablet TAKE 1 TABLET BY MOUTH TWICE DAILY WITH A MEAL  . metoprolol tartrate (LOPRESSOR) 25 MG tablet TAKE 1 TABLET BY MOUTH TWICE DAILY  . Multiple Vitamins-Minerals (ALIVE WOMENS 50+ PO) Take by mouth every other day.   . Omega-3 Fatty Acids (FISH OIL) 500 MG CAPS Take 1 capsule by mouth daily.   . TURMERIC PO Take 1 tablet by mouth.  . [DISCONTINUED] metformin (FORTAMET) 1000 MG (OSM) 24 hr tablet Take 2 tablets (2,000 mg total) by mouth daily with breakfast.  . [DISCONTINUED] ranitidine (ZANTAC) 150 MG tablet Take 150 mg by mouth as needed for heartburn.   No facility-administered encounter medications on file as of 02/23/2018.     Review of Systems  Constitutional: Negative for appetite change and unexpected weight change.  HENT: Negative for congestion and sinus pressure.   Respiratory: Negative for cough, chest tightness and shortness of breath.   Cardiovascular: Negative for chest pain,  palpitations and leg swelling.  Gastrointestinal: Negative for abdominal pain, diarrhea, nausea and vomiting.  Genitourinary: Negative for difficulty urinating and dysuria.  Musculoskeletal: Negative for joint swelling and myalgias.  Skin: Negative for color change and rash.  Neurological: Negative for dizziness, light-headedness and headaches.  Psychiatric/Behavioral: Negative for agitation and dysphoric mood.       Objective:    Physical Exam Constitutional:      General: She is not in acute distress.    Appearance: Normal appearance.  HENT:     Nose: Nose normal. No congestion.     Mouth/Throat:     Pharynx: No oropharyngeal exudate or posterior oropharyngeal erythema.  Neck:     Musculoskeletal: Neck supple. No muscular tenderness.     Thyroid: No thyromegaly.  Cardiovascular:     Rate and Rhythm: Normal rate and regular rhythm.  Pulmonary:     Effort: No respiratory distress.     Breath sounds: Normal breath sounds. No wheezing.  Abdominal:     General: Bowel sounds are normal.     Palpations: Abdomen is soft.     Tenderness: There is no abdominal tenderness.  Musculoskeletal:        General: No swelling or tenderness.  Lymphadenopathy:     Cervical: No cervical adenopathy.  Skin:    Findings: No erythema or rash.  Neurological:     Mental Status: She is alert.  Psychiatric:        Mood and Affect: Mood normal.        Behavior: Behavior normal.     BP 110/70 (BP Location: Left Arm, Patient Position: Sitting, Cuff Size: Large)   Pulse 65   Temp 97.9 F (36.6 C) (Oral)   Resp 16   Wt 270 lb 9.6 oz (122.7 kg)   LMP 02/21/2012   SpO2 98%   BMI 39.96 kg/m  Wt Readings from Last 3 Encounters:  02/23/18 270 lb 9.6 oz (122.7 kg)  12/28/17 274 lb 12.8 oz (124.6 kg)  12/14/17 274 lb (124.3 kg)     Lab Results  Component Value Date   WBC 4.2 11/27/2017   HGB 13.0 11/27/2017   HCT 38.2 11/27/2017   PLT 179.0 11/27/2017   GLUCOSE 188 (H) 11/27/2017   CHOL  174 11/27/2017   TRIG 83.0 11/27/2017   HDL 60.70 11/27/2017   LDLCALC 96 11/27/2017   ALT 15 11/27/2017   AST 13 11/27/2017   NA 140 11/27/2017   K 4.3 11/27/2017   CL  104 11/27/2017   CREATININE 0.86 11/27/2017   BUN 22 11/27/2017   CO2 26 11/27/2017   TSH 2.00 07/28/2017   HGBA1C 9.5 (H) 11/27/2017   MICROALBUR 0.8 07/28/2017    Mm Diag Breast Tomo Bilateral  Result Date: 12/25/2017 CLINICAL DATA:  One year follow-up of probably benign nodule in the lower outer quadrant of the left breast. Annual examination of both breasts. EXAM: DIGITAL DIAGNOSTIC BILATERAL MAMMOGRAM WITH CAD AND TOMO COMPARISON:  Previous exam(s). ACR Breast Density Category b: There are scattered areas of fibroglandular density. FINDINGS: Stable circumscribed nodule in the lower outer quadrant of left breast. Mildly nodular parenchymal pattern bilaterally is stable. No mass, distortion, or suspicious microcalcification is identified in either breast to suggest malignancy. Mammographic images were processed with CAD. IMPRESSION: No evidence of malignancy in either breast. RECOMMENDATION: Screening mammogram in one year.(Code:SM-B-01Y) I have discussed the findings and recommendations with the patient. Results were also provided in writing at the conclusion of the visit. If applicable, a reminder letter will be sent to the patient regarding the next appointment. BI-RADS CATEGORY  2: Benign. Electronically Signed   By: Curlene Dolphin M.D.   On: 12/25/2017 15:57       Assessment & Plan:   Problem List Items Addressed This Visit    BMI 40.0-44.9, adult ([place])    Discussed diet and exercise.  Follow.        Essential hypertension, benign    Blood pressure under good control.  Continue same medication regimen.  Follow pressures.  Follow metabolic panel.        Relevant Orders   CBC with Differential/Platelet   Basic metabolic panel   Myasthenia gravis ([place])    Stable.        SVT (supraventricular tachycardia)  ([place])    Doing well on metoprolol.       Type 2 diabetes mellitus with hyperglycemia (HCC)    Sugars as outlined.  Per her report, sugars better.  Continue low carb diet and exercise.  Follow met b and a1c.       Relevant Orders   Hemoglobin A1c   Hepatic function panel   Lipid panel       Einar Pheasant, MD

## 2018-02-28 ENCOUNTER — Encounter: Payer: Self-pay | Admitting: Internal Medicine

## 2018-02-28 NOTE — Assessment & Plan Note (Signed)
Stable

## 2018-02-28 NOTE — Assessment & Plan Note (Signed)
Sugars as outlined.  Per her report, sugars better.  Continue low carb diet and exercise.  Follow met b and a1c.

## 2018-02-28 NOTE — Assessment & Plan Note (Signed)
Doing well on metoprolol

## 2018-02-28 NOTE — Assessment & Plan Note (Signed)
Discussed diet and exercise.  Follow.  

## 2018-02-28 NOTE — Assessment & Plan Note (Signed)
Blood pressure under good control.  Continue same medication regimen.  Follow pressures.  Follow metabolic panel.   

## 2018-04-28 ENCOUNTER — Other Ambulatory Visit (INDEPENDENT_AMBULATORY_CARE_PROVIDER_SITE_OTHER): Payer: BLUE CROSS/BLUE SHIELD

## 2018-04-28 ENCOUNTER — Other Ambulatory Visit: Payer: Self-pay

## 2018-04-28 DIAGNOSIS — I10 Essential (primary) hypertension: Secondary | ICD-10-CM

## 2018-04-28 DIAGNOSIS — E1165 Type 2 diabetes mellitus with hyperglycemia: Secondary | ICD-10-CM | POA: Diagnosis not present

## 2018-04-28 LAB — LIPID PANEL
Cholesterol: 201 mg/dL — ABNORMAL HIGH (ref 0–200)
HDL: 70.6 mg/dL (ref >39.00)
LDL Cholesterol: 114 mg/dL — ABNORMAL HIGH (ref 0–99)
NonHDL: 130.24
Total CHOL/HDL Ratio: 3
Triglycerides: 80 mg/dL (ref 0.0–149.0)
VLDL: 16 mg/dL (ref 0.0–40.0)

## 2018-04-28 LAB — CBC WITH DIFFERENTIAL/PLATELET
Basophils Absolute: 0 10*3/uL (ref 0.0–0.1)
Basophils Relative: 0.9 % (ref 0.0–3.0)
Eosinophils Absolute: 0.1 10*3/uL (ref 0.0–0.7)
Eosinophils Relative: 3.9 % (ref 0.0–5.0)
HCT: 42.7 % (ref 36.0–46.0)
Hemoglobin: 14.4 g/dL (ref 12.0–15.0)
Lymphocytes Relative: 56.8 % — ABNORMAL HIGH (ref 12.0–46.0)
Lymphs Abs: 1.8 10*3/uL (ref 0.7–4.0)
MCHC: 33.8 g/dL (ref 30.0–36.0)
MCV: 87.9 fl (ref 78.0–100.0)
Monocytes Absolute: 0.3 10*3/uL (ref 0.1–1.0)
Monocytes Relative: 9.3 % (ref 3.0–12.0)
Neutro Abs: 0.9 10*3/uL — ABNORMAL LOW (ref 1.4–7.7)
Neutrophils Relative %: 29.1 % — ABNORMAL LOW (ref 43.0–77.0)
Platelets: 196 10*3/uL (ref 150.0–400.0)
RBC: 4.85 Mil/uL (ref 3.87–5.11)
RDW: 14 % (ref 11.5–15.5)
WBC: 3.2 10*3/uL — ABNORMAL LOW (ref 4.0–10.5)

## 2018-04-28 LAB — HEMOGLOBIN A1C: Hgb A1c MFr Bld: 7.6 % — ABNORMAL HIGH (ref 4.6–6.5)

## 2018-04-28 LAB — HEPATIC FUNCTION PANEL
ALT: 11 U/L (ref 0–35)
AST: 13 U/L (ref 0–37)
Albumin: 4.4 g/dL (ref 3.5–5.2)
Alkaline Phosphatase: 47 U/L (ref 39–117)
Bilirubin, Direct: 0.1 mg/dL (ref 0.0–0.3)
Total Bilirubin: 0.5 mg/dL (ref 0.2–1.2)
Total Protein: 7.2 g/dL (ref 6.0–8.3)

## 2018-04-28 LAB — BASIC METABOLIC PANEL
BUN: 20 mg/dL (ref 6–23)
CO2: 29 mEq/L (ref 19–32)
Calcium: 9.6 mg/dL (ref 8.4–10.5)
Chloride: 104 mEq/L (ref 96–112)
Creatinine, Ser: 0.92 mg/dL (ref 0.40–1.20)
GFR: 74.9 mL/min (ref >60.00)
Glucose, Bld: 129 mg/dL — ABNORMAL HIGH (ref 70–99)
Potassium: 4.4 mEq/L (ref 3.5–5.1)
Sodium: 139 mEq/L (ref 135–145)

## 2018-05-03 ENCOUNTER — Other Ambulatory Visit: Payer: Self-pay | Admitting: Internal Medicine

## 2018-05-03 DIAGNOSIS — D709 Neutropenia, unspecified: Secondary | ICD-10-CM

## 2018-05-03 DIAGNOSIS — D72819 Decreased white blood cell count, unspecified: Secondary | ICD-10-CM

## 2018-05-03 NOTE — Progress Notes (Signed)
Order placed for hematology referral.  

## 2018-06-05 ENCOUNTER — Other Ambulatory Visit: Payer: Self-pay | Admitting: Internal Medicine

## 2018-06-29 ENCOUNTER — Ambulatory Visit: Payer: BLUE CROSS/BLUE SHIELD | Admitting: Internal Medicine

## 2018-07-15 ENCOUNTER — Other Ambulatory Visit: Payer: Self-pay | Admitting: Internal Medicine

## 2018-07-20 ENCOUNTER — Ambulatory Visit (INDEPENDENT_AMBULATORY_CARE_PROVIDER_SITE_OTHER): Payer: BLUE CROSS/BLUE SHIELD | Admitting: Internal Medicine

## 2018-07-20 ENCOUNTER — Other Ambulatory Visit: Payer: Self-pay

## 2018-07-20 ENCOUNTER — Encounter: Payer: Self-pay | Admitting: Internal Medicine

## 2018-07-20 ENCOUNTER — Other Ambulatory Visit: Payer: Self-pay | Admitting: Internal Medicine

## 2018-07-20 DIAGNOSIS — G7000 Myasthenia gravis without (acute) exacerbation: Secondary | ICD-10-CM | POA: Diagnosis not present

## 2018-07-20 DIAGNOSIS — D72819 Decreased white blood cell count, unspecified: Secondary | ICD-10-CM | POA: Diagnosis not present

## 2018-07-20 DIAGNOSIS — I471 Supraventricular tachycardia: Secondary | ICD-10-CM

## 2018-07-20 DIAGNOSIS — I10 Essential (primary) hypertension: Secondary | ICD-10-CM | POA: Diagnosis not present

## 2018-07-20 DIAGNOSIS — Z1211 Encounter for screening for malignant neoplasm of colon: Secondary | ICD-10-CM

## 2018-07-20 DIAGNOSIS — E119 Type 2 diabetes mellitus without complications: Secondary | ICD-10-CM | POA: Diagnosis not present

## 2018-07-20 DIAGNOSIS — E1165 Type 2 diabetes mellitus with hyperglycemia: Secondary | ICD-10-CM

## 2018-07-20 MED ORDER — LISINOPRIL-HYDROCHLOROTHIAZIDE 10-12.5 MG PO TABS: 1.0000 | ORAL_TABLET | Freq: Every day | ORAL | 1 refills | Status: DC

## 2018-07-20 MED ORDER — METFORMIN HCL 1000 MG PO TABS: ORAL_TABLET | ORAL | 0 refills | Status: DC

## 2018-07-20 MED ORDER — EMPAGLIFLOZIN 10 MG PO TABS: 10.0000 mg | ORAL_TABLET | Freq: Every day | ORAL | 1 refills | Status: DC

## 2018-07-20 NOTE — Progress Notes (Signed)
Patient ID: Amber Decker, female   DOB: 08/16/1955, 63 y.o.   MRN: [id number]   Virtual Visit via video Note  This visit type was conducted due to national recommendations for restrictions regarding the COVID-19 pandemic (e.g. social distancing).  This format is felt to be most appropriate for this patient at this time.  All issues noted in this document were discussed and addressed.  No physical exam was performed (except for noted visual exam findings with Video Visits).   I connected with Wilbur Ishii by a video enabled telemedicine application and verified that I am speaking with the correct person using two identifiers. Location patient: home Location provider: work Persons participating in the virtual visit:  patient, provider  I discussed the limitations, risks, security and privacy concerns of performing an evaluation and management service by video and the availability of in person appointments. The patient expressed understanding and agreed to proceed.  Interactive audio and video telecommunications were attempted between this provider and patient and were successful initially.  Due to increased static, we had to convert to a telephone visit.   We continued and completed visit with audio only.   Reason for visit: scheduled follow up.   HPI: She reports she is doing relatively well.  Discussed diet and exercise.  No chest pain.  No sob.  No acid reflux.  No abdominal pain.  Bowels moving.  Last a1c elevated 7.6 -  3/20.  On Jardiance and metformin.  States that am sugars averaging 114-125 and pm sugars 170.  Watching carbs.  No known covid exposure.  No fever.  No chest congestion or sob.     ROS: See pertinent positives and negatives per HPI.   Past Medical History:  Diagnosis Date  . Diabetes mellitus without complication ([place]) 9417  . Herpes   . Hypertension   . Myasthenia gravis [facility])    onset age 63's?  . SVT (supraventricular tachycardia) ([place])     Past Surgical  History:  Procedure Laterality Date  . MYOMECTOMY    . UTERINE FIBROID SURGERY     at [facility] History  Problem Relation Age of Onset  . Diabetes Mother   . Alcohol abuse Father   . Heart disease Father        myocardial infarction  . Breast cancer Neg Hx   . Colon cancer Neg Hx     SOCIAL HX: reviewed.    Current Outpatient Medications:  .  acyclovir (ZOVIRAX) 400 MG tablet, TAKE 1 TABLET (400 MG TOTAL) DAILY BY MOUTH., Disp: 90 tablet, Rfl: 1 .  aspirin EC 81 MG tablet, Take 81 mg by mouth daily., Disp: , Rfl:  .  Biotin 1000 MCG tablet, Take 1,000 mcg by mouth daily., Disp: , Rfl:  .  cyanocobalamin 1000 MCG tablet, Take 1,000 mcg by mouth daily., Disp: , Rfl:  .  Docusate Calcium (STOOL SOFTENER PO), Take 400 mg by mouth daily as needed. , Disp: , Rfl:  .  empagliflozin (JARDIANCE) 10 MG TABS tablet, Take 10 mg by mouth daily., Disp: 90 tablet, Rfl: 1 .  lisinopril-hydrochlorothiazide (ZESTORETIC) 10-12.5 MG tablet, Take 1 tablet by mouth daily., Disp: 90 tablet, Rfl: 1 .  metFORMIN (GLUCOPHAGE) 1000 MG tablet, TAKE 1 TABLET BY MOUTH TWICE DAILY WITH A MEAL, Disp: 180 tablet, Rfl: 0 .  metoprolol tartrate (LOPRESSOR) 25 MG tablet, Take 1 tablet by mouth twice daily, Disp: 180 tablet, Rfl: 0 .  Multiple Vitamins-Minerals (ALIVE WOMENS 50+  PO), Take by mouth every other day. , Disp: , Rfl:  .  Omega-3 Fatty Acids (FISH OIL) 500 MG CAPS, Take 1 capsule by mouth daily. , Disp: , Rfl:  .  TURMERIC PO, Take 1 tablet by mouth., Disp: , Rfl:   EXAM:  GENERAL: alert, oriented, appears well and in no acute distress  HEENT: atraumatic, conjunttiva clear, no obvious abnormalities on inspection of external nose and ears  NECK: normal movements of the head and neck  LUNGS: on inspection no signs of respiratory distress, breathing rate appears normal, no obvious gross SOB, gasping or wheezing  CV: no obvious cyanosis  PSYCH/NEURO: pleasant and cooperative, no obvious  depression or anxiety, speech and thought processing grossly intact  ASSESSMENT AND PLAN:  Discussed the following assessment and plan:   Essential hypertension, benign Blood pressure has been under good control.  Continue current medication regimen.  Follow pressures.  Follow metabolic panel.   Leukopenia Follow cbc.   Myasthenia gravis Stable.    SVT (supraventricular tachycardia) No increased heart rate or palpitations.  On metoprolol and doing well.  Follow.    Type 2 diabetes mellitus with hyperglycemia (HCC) Sugars as outlined.  Appear to be doing better.  On metformin and jardiance.  Low carb diet and exercise.  Follow met b and a1c.    Colon cancer screening Discussed with her today.  She is concerned because of insurance.  Wants to hold on referral.      I discussed the assessment and treatment plan with the patient. The patient was provided an opportunity to ask questions and all were answered. The patient agreed with the plan and demonstrated an understanding of the instructions.   The patient was advised to call back or seek an in-person evaluation if the symptoms worsen or if the condition fails to improve as anticipated.  I provided 17 minutes of non-face-to-face time during this encounter.   Einar Pheasant, MD

## 2018-07-25 ENCOUNTER — Encounter: Payer: Self-pay | Admitting: Internal Medicine

## 2018-07-25 NOTE — Assessment & Plan Note (Signed)
Stable

## 2018-07-25 NOTE — Assessment & Plan Note (Signed)
Discussed with her today.  She is concerned because of insurance.  Wants to hold on referral.

## 2018-07-25 NOTE — Assessment & Plan Note (Signed)
Blood pressure has been under good control.  Continue current medication regimen.  Follow pressures.  Follow metabolic panel.  

## 2018-07-25 NOTE — Assessment & Plan Note (Signed)
Sugars as outlined.  Appear to be doing better.  On metformin and jardiance.  Low carb diet and exercise.  Follow met b and a1c.

## 2018-07-25 NOTE — Assessment & Plan Note (Signed)
Follow cbc.  

## 2018-07-25 NOTE — Assessment & Plan Note (Signed)
No increased heart rate or palpitations.  On metoprolol and doing well.  Follow.

## 2018-08-24 ENCOUNTER — Other Ambulatory Visit: Payer: Self-pay | Admitting: Internal Medicine

## 2018-09-03 ENCOUNTER — Other Ambulatory Visit: Payer: Self-pay

## 2018-09-03 DIAGNOSIS — Z20822 Contact with and (suspected) exposure to covid-19: Secondary | ICD-10-CM

## 2018-09-06 LAB — NOVEL CORONAVIRUS, NAA: SARS-CoV-2, NAA: NOT DETECTED

## 2018-09-09 ENCOUNTER — Telehealth: Payer: Self-pay

## 2018-09-09 NOTE — Telephone Encounter (Signed)
Patient called in requesting COVID19 test results - DOB verified - results given, no further questions. 

## 2018-12-26 ENCOUNTER — Other Ambulatory Visit: Payer: Self-pay | Admitting: Internal Medicine

## 2019-01-04 ENCOUNTER — Other Ambulatory Visit: Payer: Self-pay | Admitting: Internal Medicine

## 2019-01-27 ENCOUNTER — Other Ambulatory Visit: Payer: Self-pay | Admitting: Internal Medicine

## 2019-03-03 ENCOUNTER — Telehealth: Payer: Self-pay | Admitting: Internal Medicine

## 2019-03-03 NOTE — Telephone Encounter (Signed)
Patient received a  email from [facility] that a 90 day supply or JARDIANCE 10 MG TABS tablet, cost was $1780 from [place]. Does Dr. Lorin Picket have any coupons or she will need another type of medication.

## 2019-03-04 ENCOUNTER — Other Ambulatory Visit: Payer: Self-pay | Admitting: Internal Medicine

## 2019-03-04 DIAGNOSIS — E1165 Type 2 diabetes mellitus with hyperglycemia: Secondary | ICD-10-CM

## 2019-03-04 NOTE — Progress Notes (Signed)
Order placed for ccm referral.  

## 2019-03-04 NOTE — Telephone Encounter (Signed)
She is okay with referral. I pulled one bottle of samples for her to pick up today because she is out. She is on her way to pick up now.

## 2019-03-04 NOTE — Telephone Encounter (Signed)
If she is ok with referral, I am ok with referral to [name]

## 2019-03-04 NOTE — Telephone Encounter (Signed)
Order placed for ccm referral.  

## 2019-03-04 NOTE — Telephone Encounter (Signed)
Spoke with pharmacy to clarify. Her insurance has changed and even for a 30 day supply it is going to cost her about $400 a month. Are you ok to refer her to [name] or is there another medication you would like to try. Insurance did not list any other drugs that are preferred.

## 2019-03-07 ENCOUNTER — Telehealth: Payer: Self-pay | Admitting: Internal Medicine

## 2019-03-07 NOTE — Chronic Care Management (AMB) (Signed)
  Care Management   Note  03/07/2019 Name: Amber Decker MRN: [id number] DOB: [birth year]  Amber Decker is a 64 y.o. year old female who is a primary care patient of Dale Muskego, MD. I reached out to Amber Decker by phone today in response to a referral sent by Ms. Jamika G Langbehn's health plan.    [name] was given information about care management services today including:  1. Care management services include personalized support from designated clinical staff supervised by her physician, including individualized plan of care and coordination with other care providers 2. 24/7 contact phone numbers for assistance for urgent and routine care needs. 3. The patient may stop care management services at any time by phone call to the office staff.  Patient agreed to services and verbal consent obtained.   Follow up plan: Telephone appointment with care management team member scheduled for:04/11/2019  Elisha Ponder, LPN Health Advisor, Embedded Care Coordination [facility] Health Care Management ??[email or web address] ??[phone]

## 2019-03-08 ENCOUNTER — Telehealth: Payer: Self-pay | Admitting: Internal Medicine

## 2019-03-08 NOTE — Telephone Encounter (Signed)
She was referred for CCM.  We had discussed pt.  See if has f/u with [name] - may need to change to preferred medication

## 2019-03-08 NOTE — Telephone Encounter (Signed)
Pt states that even with discount card, the JARDIANCE 10 MG TABS tablet is over 300.00. Please call pt to advise.

## 2019-03-09 NOTE — Telephone Encounter (Signed)
She has an appt with [name] on 3/1. Reached out to [name] today to see what I need to do.

## 2019-03-10 ENCOUNTER — Ambulatory Visit: Payer: Self-pay | Admitting: Pharmacist

## 2019-03-10 DIAGNOSIS — E1165 Type 2 diabetes mellitus with hyperglycemia: Secondary | ICD-10-CM

## 2019-03-10 NOTE — Telephone Encounter (Signed)
Patient has forms to sign up front and sample to pick up per [name]. Patient is supposed to be coming by tomorrow. Dr Lorin Picket has signed her part of form.

## 2019-03-10 NOTE — Patient Instructions (Signed)
Visit Information  Goals Addressed            This Visit's Progress     Patient Stated   . PharmD "I can't afford this medication" (pt-stated)       Current Barriers:  . Diabetes: uncontrolled; complicated by chronic medical conditions including HTN, myasthenia gravis, most recent A1c 7.6% but due for updated labs . Financial concerns: patient notes that her copay for [place] is >$1500 for a 90 day supply, still >$300 with copay card . Current antihyperglycemic regimen: metformin 1000 mg BID, Jardiance 10 mg daily . Cardiovascular risk reduction: o Current hypertensive regimen: lisinopril 10/12.5 mg daily, metoprolol tartrate 25 mg BID o Current hyperlipidemia regimen: none, needs updated lab work o Current antiplatelet regimen: ASA 81 mg daily  Pharmacist Clinical Goal(s):  [name] Over the next 90 days, patient will work with PharmD and primary care provider to address optimized medication management  Interventions: . Comprehensive medication review performed, medication list updated in electronic medical record . Health and safety inspector. They do have patient's 2021 insurance information on file: BIN [id number], [id number], [place] [id number], Group [id number]. Appears patient has a significant deductible that the cost of the medication is going towards . Contacted patient's insurance plan. They note that she has a $7020 combined medical and pharmacy deductible. Any brand name medications likely contribute towards paying down the deductible, so will be just as expensive.  . [employer] patient assistance program may allow patient to apply, even though she is commercially insured, because she "does not have sufficient insurance coverage for the medication". Discussed this program. Patient will come by clinic and sign application, provide copy of last year's tax return, and provide proof of copay for Jardiance. I will collaborate w/ Dr. Lorin Picket on her signature. Once all parts received, will  fax to [facility].  Steele Sizer w/ clinic CMA to prepare 30 day sample of Jardiance for patient.  . Moving forward, will re-evaluate need for statin therapy  Patient Self Care Activities:  . Patient will check blood glucose BID, document, and provide at future appointments . Patient will take medications as prescribed . Patient will report any questions or concerns to provider   Initial goal documentation        The patient verbalized understanding of instructions provided today and declined a print copy of patient instruction materials.    Plan:  - Will collaborate w/ patient and provider as above  [name] Feliz Beam, PharmD, [place], CPP Clinical Pharmacist [facility] [employer] [phone]

## 2019-03-10 NOTE — Chronic Care Management (AMB) (Signed)
Chronic Care Management   Follow Up Note   03/10/2019 Name: Amber Decker MRN: [id number] DOB: 1955/07/01  Referred by: Dale [place], MD Reason for referral : Chronic Care Management (Medication Management)   Amber Decker is a 64 y.o. year old female who is a primary care patient of Dale Coats, MD. The CCM team was consulted for assistance with chronic disease management and care coordination needs.    Contacted patient for medication access need.   Review of patient status, including review of consultants reports, relevant laboratory and other test results, and collaboration with appropriate care team members and the patient's provider was performed as part of comprehensive patient evaluation and provision of chronic care management services.    SDOH (Social Determinants of Health) screening performed today: Financial Strain . See Care Plan for related entries.   Outpatient Encounter Medications as of 03/10/2019  Medication Sig  . metFORMIN (GLUCOPHAGE) 1000 MG tablet TAKE 1 TABLET BY MOUTH TWICE DAILY WITH A MEAL  . acyclovir (ZOVIRAX) 400 MG tablet TAKE 1 TABLET (400 MG TOTAL) DAILY BY MOUTH.  [name] aspirin EC 81 MG tablet Take 81 mg by mouth daily.  . Biotin 1000 MCG tablet Take 1,000 mcg by mouth daily.  . cyanocobalamin 1000 MCG tablet Take 1,000 mcg by mouth daily.  Tery Sanfilippo Calcium (STOOL SOFTENER PO) Take 400 mg by mouth daily as needed.   [name] JARDIANCE 10 MG TABS tablet Take 1 tablet by mouth once daily  . lisinopril-hydrochlorothiazide (ZESTORETIC) 10-12.5 MG tablet TAKE 1 TABLET BY MOUTH EVERY DAY  . metoprolol tartrate (LOPRESSOR) 25 MG tablet Take 1 tablet by mouth twice daily  . Multiple Vitamins-Minerals (ALIVE WOMENS 50+ PO) Take by mouth every other day.   . Omega-3 Fatty Acids (FISH OIL) 500 MG CAPS Take 1 capsule by mouth daily.   . TURMERIC PO Take 1 tablet by mouth.   No facility-administered encounter medications on file as of 03/10/2019.      Objective:   Goals Addressed            This Visit's Progress     Patient Stated   . PharmD "I can't afford this medication" (pt-stated)       Current Barriers:  . Diabetes: uncontrolled; complicated by chronic medical conditions including HTN, myasthenia gravis, most recent A1c 7.6% but due for updated labs . Financial concerns: patient notes that her copay for [place] Pepper is >$1500 for a 90 day supply, still >$300 with copay card . Current antihyperglycemic regimen: metformin 1000 mg BID, Jardiance 10 mg daily . Cardiovascular risk reduction: o Current hypertensive regimen: lisinopril 10/12.5 mg daily, metoprolol tartrate 25 mg BID o Current hyperlipidemia regimen: none, needs updated lab work o Current antiplatelet regimen: ASA 81 mg daily  Pharmacist Clinical Goal(s):  [name] Over the next 90 days, patient will work with PharmD and primary care provider to address optimized medication management  Interventions: . Comprehensive medication review performed, medication list updated in electronic medical record . Health and safety inspector. They do have patient's 2021 insurance information on file: BIN [id number], PCN [id number], ID [id number], Group [id number]. Appears patient has a significant deductible that the cost of the medication is going towards . Contacted patient's insurance plan. They note that she has a $7020 combined medical and pharmacy deductible. Any brand name medications likely contribute towards paying down the deductible, so will be just as expensive.  . [employer] patient assistance program may allow patient to apply, even though she is commercially  insured, because she "does not have sufficient insurance coverage for the medication". Discussed this program. Patient will come by clinic and sign application, provide copy of last year's tax return, and provide proof of copay for Jardiance. I will collaborate w/ Dr. Nicki Reaper on her signature. Once all parts received, will  fax to [facility].  Nash Dimmer w/ clinic CMA to prepare 30 day sample of Jardiance for patient.  . Moving forward, will re-evaluate need for statin therapy  Patient Self Care Activities:  . Patient will check blood glucose BID, document, and provide at future appointments . Patient will take medications as prescribed . Patient will report any questions or concerns to provider   Initial goal documentation         Plan:  - Will collaborate w/ patient and provider as above  [name] Darnelle Maffucci, PharmD, [place], [place] [phone]

## 2019-03-11 NOTE — Progress Notes (Signed)
Reviewed information.  Agree with plan.    Dr Muzammil Bruins 

## 2019-03-17 ENCOUNTER — Ambulatory Visit: Payer: Self-pay | Admitting: Pharmacist

## 2019-03-17 DIAGNOSIS — E1165 Type 2 diabetes mellitus with hyperglycemia: Secondary | ICD-10-CM

## 2019-03-17 NOTE — Progress Notes (Signed)
Reviewed information.  Agree with plan.    Dr Rayen Palen 

## 2019-03-17 NOTE — Patient Instructions (Signed)
Visit Information  Goals Addressed            This Visit's Progress     Patient Stated   . PharmD "I can't afford this medication" (pt-stated)       Current Barriers:  . Diabetes: uncontrolled; complicated by chronic medical conditions including HTN, myasthenia gravis, most recent A1c 7.6% but due for updated labs . Financial concerns: patient notes that her copay for [place] Pepper is >$1500 for a 90 day supply, still >$300 with copay card . Current antihyperglycemic regimen: metformin 1000 mg BID, Jardiance 10 mg daily . Cardiovascular risk reduction: o Current hypertensive regimen: lisinopril 10/12.5 mg daily, metoprolol tartrate 25 mg BID o Current hyperlipidemia regimen: none, needs updated lab work o Current antiplatelet regimen: ASA 81 mg daily  Pharmacist Clinical Goal(s):  [name] Over the next 90 days, patient will work with PharmD and primary care provider to address optimized medication management  Interventions: . Received patient signature and provider signature. Still need proof of income and proof of Jardiance copay from patient. Contacted patient, reiterated the above. She verbalized understanding.   Patient Self Care Activities:  . Patient will check blood glucose BID, document, and provide at future appointments . Patient will take medications as prescribed . Patient will report any questions or concerns to provider   Please see past updates related to this goal by clicking on the "Past Updates" button in the selected goal         The patient verbalized understanding of instructions provided today and declined a print copy of patient instruction materials.   Plan:  - Will continue to collaborate w/ patient as above  [name] Feliz Beam, PharmD, [place], CPP Clinical Pharmacist [facility] [employer] [phone]

## 2019-03-17 NOTE — Chronic Care Management (AMB) (Signed)
Chronic Care Management   Follow Up Note   03/17/2019 Name: Amber Decker MRN: [id number] DOB: 1955/11/11  Referred by: Amber [place], MD Reason for referral : Chronic Care Management (Medication Management)   Amber Decker is a 64 y.o. year old female who is a primary care patient of Amber [place], MD. The CCM team was consulted for assistance with chronic disease management and care coordination needs.    Care coordination completed today.   Review of patient status, including review of consultants reports, relevant laboratory and other test results, and collaboration with appropriate care team members and the patient's provider was performed as part of comprehensive patient evaluation and provision of chronic care management services.    SDOH (Social Determinants of Health) screening performed today: Financial Strain . See Care Plan for related entries.   Outpatient Encounter Medications as of 03/17/2019  Medication Sig  . acyclovir (ZOVIRAX) 400 MG tablet TAKE 1 TABLET (400 MG TOTAL) DAILY BY MOUTH.  Amber Decker aspirin EC 81 MG tablet Take 81 mg by mouth daily.  . Biotin 1000 MCG tablet Take 1,000 mcg by mouth daily.  . cyanocobalamin 1000 MCG tablet Take 1,000 mcg by mouth daily.  Tery Sanfilippo Calcium (STOOL SOFTENER PO) Take 400 mg by mouth daily as needed.   Amber Decker JARDIANCE 10 MG TABS tablet Take 1 tablet by mouth once daily  . lisinopril-hydrochlorothiazide (ZESTORETIC) 10-12.5 MG tablet TAKE 1 TABLET BY MOUTH EVERY DAY  . metFORMIN (GLUCOPHAGE) 1000 MG tablet TAKE 1 TABLET BY MOUTH TWICE DAILY WITH A MEAL  . metoprolol tartrate (LOPRESSOR) 25 MG tablet Take 1 tablet by mouth twice daily  . Multiple Vitamins-Minerals (ALIVE WOMENS 50+ PO) Take by mouth every other day.   . Omega-3 Fatty Acids (FISH OIL) 500 MG CAPS Take 1 capsule by mouth daily.   . TURMERIC PO Take 1 tablet by mouth.   No facility-administered encounter medications on file as of 03/17/2019.     Objective:   Goals  Addressed            This Visit's Progress     Patient Stated   . PharmD "I can't afford this medication" (pt-stated)       Current Barriers:  . Diabetes: uncontrolled; complicated by chronic medical conditions including HTN, myasthenia gravis, most recent A1c 7.6% but due for updated labs . Financial concerns: patient notes that her copay for [place] Pepper is >$1500 for a 90 day supply, still >$300 with copay card . Current antihyperglycemic regimen: metformin 1000 mg BID, Jardiance 10 mg daily . Cardiovascular risk reduction: o Current hypertensive regimen: lisinopril 10/12.5 mg daily, metoprolol tartrate 25 mg BID o Current hyperlipidemia regimen: none, needs updated lab work o Current antiplatelet regimen: ASA 81 mg daily  Pharmacist Clinical Goal(s):  Amber Decker Over the next 90 days, patient will work with PharmD and primary care provider to address optimized medication management  Interventions: . Received patient signature and provider signature. Still need proof of income and proof of Jardiance copay from patient. Contacted patient, reiterated the above. She verbalized understanding.   Patient Self Care Activities:  . Patient will check blood glucose BID, document, and provide at future appointments . Patient will take medications as prescribed . Patient will report any questions or concerns to provider   Please see past updates related to this goal by clicking on the "Past Updates" button in the selected goal          Plan:  - Will continue to collaborate w/  patient as above  Amber Decker, PharmD, [place], [place] Clinical Pharmacist [place] [phone]

## 2019-03-22 ENCOUNTER — Ambulatory Visit: Payer: Self-pay | Admitting: Pharmacist

## 2019-03-22 DIAGNOSIS — E1165 Type 2 diabetes mellitus with hyperglycemia: Secondary | ICD-10-CM

## 2019-03-22 NOTE — Progress Notes (Signed)
Reviewed information.  Agree with plan.    Dr Evangela Heffler 

## 2019-03-22 NOTE — Chronic Care Management (AMB) (Signed)
Chronic Care Management   Follow Up Note   03/22/2019 Name: Amber Decker MRN: [id number] DOB: 1956/02/07  Referred by: Amber [place], MD Reason for referral : Chronic Care Management (Medication Management)   Amber Decker is a 65 y.o. year old female who is a primary care patient of Amber [place], MD. The CCM team was consulted for assistance with chronic disease management and care coordination needs.    Care coordination completed today.   Review of patient status, including review of consultants reports, relevant laboratory and other test results, and collaboration with appropriate care team members and the patient's provider was performed as part of comprehensive patient evaluation and provision of chronic care management services.    SDOH (Social Determinants of Health) screening performed today: Financial Strain . See Care Plan for related entries.   Outpatient Encounter Medications as of 03/22/2019  Medication Sig  . acyclovir (ZOVIRAX) 400 MG tablet TAKE 1 TABLET (400 MG TOTAL) DAILY BY MOUTH.  [name] aspirin EC 81 MG tablet Take 81 mg by mouth daily.  . Biotin 1000 MCG tablet Take 1,000 mcg by mouth daily.  . cyanocobalamin 1000 MCG tablet Take 1,000 mcg by mouth daily.  Amber Decker Calcium (STOOL SOFTENER PO) Take 400 mg by mouth daily as needed.   [name] JARDIANCE 10 MG TABS tablet Take 1 tablet by mouth once daily  . lisinopril-hydrochlorothiazide (ZESTORETIC) 10-12.5 MG tablet TAKE 1 TABLET BY MOUTH EVERY DAY  . metFORMIN (GLUCOPHAGE) 1000 MG tablet TAKE 1 TABLET BY MOUTH TWICE DAILY WITH A MEAL  . metoprolol tartrate (LOPRESSOR) 25 MG tablet Take 1 tablet by mouth twice daily  . Multiple Vitamins-Minerals (ALIVE WOMENS 50+ PO) Take by mouth every other day.   . Omega-3 Fatty Acids (FISH OIL) 500 MG CAPS Take 1 capsule by mouth daily.   . TURMERIC PO Take 1 tablet by mouth.   No facility-administered encounter medications on file as of 03/22/2019.     Objective:   Goals  Addressed            This Visit's Progress     Patient Stated   . PharmD "I can't afford this medication" (pt-stated)       Current Barriers:  . Diabetes: uncontrolled; complicated by chronic medical conditions including HTN, myasthenia gravis, most recent A1c 7.6% but due for updated labs o Collaboratively decided to attempt to apply for Jardiance assistance through [employer] . Financial concerns: patient notes that her copay for Jardiance is >$1500 for a 90 day supply, still >$300 with copay card . Current antihyperglycemic regimen: metformin 1000 mg BID, Jardiance 10 mg daily . Cardiovascular risk reduction: o Current hypertensive regimen: lisinopril 10/12.5 mg daily, metoprolol tartrate 25 mg BID o Current hyperlipidemia regimen: none, needs updated lab work o Current antiplatelet regimen: ASA 81 mg daily  Pharmacist Clinical Goal(s):  [name] Over the next 90 days, patient will work with PharmD and primary care provider to address optimized medication management  Interventions: . Received patient proof of income and proof of copay. Faxed complete application into [employer]. Will follow up with company in 3-5 business days regarding status of application  Patient Self Care Activities:  . Patient will check blood glucose BID, document, and provide at future appointments . Patient will take medications as prescribed . Patient will report any questions or concerns to provider   Please see past updates related to this goal by clicking on the "Past Updates" button in the selected goal  Plan:  - Will continue to collaborate w/ patient as above  Amber Decker, PharmD, [place], [place] Pharmacist [place] [phone]

## 2019-03-22 NOTE — Patient Instructions (Signed)
Visit Information  Goals Addressed            This Visit's Progress     Patient Stated   . PharmD "I can't afford this medication" (pt-stated)       Current Barriers:  . Diabetes: uncontrolled; complicated by chronic medical conditions including HTN, myasthenia gravis, most recent A1c 7.6% but due for updated labs o Collaboratively decided to attempt to apply for Jardiance assistance through [employer] . Financial concerns: patient notes that her copay for Jardiance is >$1500 for a 90 day supply, still >$300 with copay card . Current antihyperglycemic regimen: metformin 1000 mg BID, Jardiance 10 mg daily . Cardiovascular risk reduction: o Current hypertensive regimen: lisinopril 10/12.5 mg daily, metoprolol tartrate 25 mg BID o Current hyperlipidemia regimen: none, needs updated lab work o Current antiplatelet regimen: ASA 81 mg daily  Pharmacist Clinical Goal(s):  [name] Over the next 90 days, patient will work with PharmD and primary care provider to address optimized medication management  Interventions: . Received patient proof of income and proof of copay. Faxed complete application into [employer]. Will follow up with company in 3-5 business days regarding status of application  Patient Self Care Activities:  . Patient will check blood glucose BID, document, and provide at future appointments . Patient will take medications as prescribed . Patient will report any questions or concerns to provider   Please see past updates related to this goal by clicking on the "Past Updates" button in the selected goal         The patient verbalized understanding of instructions provided today and declined a print copy of patient instruction materials.   Plan:  - Will continue to collaborate w/ patient as above  [name] Feliz Beam, PharmD, [place], CPP Clinical Pharmacist [facility] [employer] [phone]

## 2019-03-28 ENCOUNTER — Ambulatory Visit: Payer: Self-pay | Admitting: Pharmacist

## 2019-03-28 DIAGNOSIS — E1165 Type 2 diabetes mellitus with hyperglycemia: Secondary | ICD-10-CM

## 2019-03-28 MED ORDER — METFORMIN HCL ER 500 MG PO TB24: 1000.0000 mg | ORAL_TABLET | Freq: Two times a day (BID) | ORAL | 3 refills | Status: DC

## 2019-03-28 NOTE — Chronic Care Management (AMB) (Signed)
Chronic Care Management   Follow Up Note   03/28/2019 Name: Amber Decker MRN: [id number] DOB: 1956/02/01  Referred by: Einar Pheasant, MD Reason for referral : Chronic Care Management (Medication Management)   Amber Decker is a 64 y.o. year old female who is a primary care patient of Einar Pheasant, MD. The CCM team was consulted for assistance with chronic disease management and care coordination needs.    Care coordination completed today.   Review of patient status, including review of consultants reports, relevant laboratory and other test results, and collaboration with appropriate care team members and the patient's provider was performed as part of comprehensive patient evaluation and provision of chronic care management services.    SDOH (Social Determinants of Health) screening performed today: Financial Strain . See Care Plan for related entries.   Outpatient Encounter Medications as of 03/28/2019  Medication Sig  . acyclovir (ZOVIRAX) 400 MG tablet TAKE 1 TABLET (400 MG TOTAL) Decker BY MOUTH.  [name] aspirin EC 81 MG tablet Take 81 mg by mouth Decker.  . Biotin 1000 MCG tablet Take 1,000 mcg by mouth Decker.  . cyanocobalamin 1000 MCG tablet Take 1,000 mcg by mouth Decker.  [name] Calcium (STOOL SOFTENER PO) Take 400 mg by mouth Decker as needed.   [name] JARDIANCE 10 MG TABS tablet Take 1 tablet by mouth once Decker  . lisinopril-hydrochlorothiazide (ZESTORETIC) 10-12.5 MG tablet TAKE 1 TABLET BY MOUTH EVERY DAY  . metFORMIN (GLUCOPHAGE-XR) 500 MG 24 hr tablet Take 2 tablets (1,000 mg total) by mouth in the morning and at bedtime.  . metoprolol tartrate (LOPRESSOR) 25 MG tablet Take 1 tablet by mouth twice Decker  . Multiple Vitamins-Minerals (ALIVE WOMENS 50+ PO) Take by mouth every other day.   . Omega-3 Fatty Acids (FISH OIL) 500 MG CAPS Take 1 capsule by mouth Decker.   . TURMERIC PO Take 1 tablet by mouth.  . [DISCONTINUED] metFORMIN (GLUCOPHAGE) 1000 MG tablet TAKE 1  TABLET BY MOUTH TWICE Decker WITH A MEAL   No facility-administered encounter medications on file as of 03/28/2019.     Objective:   Goals Addressed            This Visit's Progress     Patient Stated   . PharmD "I can't afford this medication" (pt-stated)       Current Barriers:  . Diabetes: uncontrolled; complicated by chronic medical conditions including HTN, myasthenia gravis, most recent A1c 7.6% but due for updated labs o Submitted application for Jardiance on 03/22/19 . Financial concerns: patient notes that her copay for Vania Rea is >$1500 for a 90 day supply, still >$300 with copay card d/t combined pharmacy/medical deductible  . Current antihyperglycemic regimen: metformin 1000 mg BID, Jardiance 10 mg Decker o Patient wonders today about metformin XR formulation. Notes some stomach upset occasionally, wonders if related to metformin . Cardiovascular risk reduction: o Current hypertensive regimen: lisinopril 10/12.5 mg Decker, metoprolol tartrate 25 mg BID o Current hyperlipidemia regimen: none, needs updated lab work o Current antiplatelet regimen: ASA 81 mg Decker  Pharmacist Clinical Goal(s):  [name] Over the next 90 days, patient will work with PharmD and primary care provider to address optimized medication management  Interventions: . Contacted [employer]. Patient has been APPROVED for Jardiance assistance through 02/10/20. I informed the rep that patient is getting ready to run out of medication; she noted she will try to have the medication shipped today, and hopefully will arrive in the next 2-5 business days.  [name]  Contacted patient, explained the above. She verbalized understanding and appreciation. Discussed that we could switch from IR to ER metformin to see if GI upset resolves. Counseled on previous metformin ER recall, but that current lot/manufacturers being dispensed have not been affected by the recall. She verbalized understanding.   Patient Self Care Activities:   . Patient will check blood glucose BID, document, and provide at future appointments . Patient will take medications as prescribed . Patient will report any questions or concerns to provider   Please see past updates related to this goal by clicking on the "Past Updates" button in the selected goal          Plan:  - Will outreach patient as previously scheduled for follow up  [name] Feliz Beam, PharmD, [place], CPP Clinical Pharmacist [facility] [employer] [phone]

## 2019-03-28 NOTE — Patient Instructions (Signed)
Visit Information  Goals Addressed            This Visit's Progress     Patient Stated   . PharmD "I can't afford this medication" (pt-stated)       Current Barriers:  . Diabetes: uncontrolled; complicated by chronic medical conditions including HTN, myasthenia gravis, most recent A1c 7.6% but due for updated labs o Submitted application for Jardiance on 03/22/19 . Financial concerns: patient notes that her copay for [place] Pepper is >$1500 for a 90 day supply, still >$300 with copay card d/t combined pharmacy/medical deductible  . Current antihyperglycemic regimen: metformin 1000 mg BID, Jardiance 10 mg daily o Patient wonders today about metformin XR formulation. Notes some stomach upset occasionally, wonders if related to metformin . Cardiovascular risk reduction: o Current hypertensive regimen: lisinopril 10/12.5 mg daily, metoprolol tartrate 25 mg BID o Current hyperlipidemia regimen: none, needs updated lab work o Current antiplatelet regimen: ASA 81 mg daily  Pharmacist Clinical Goal(s):  [name] Over the next 90 days, patient will work with PharmD and primary care provider to address optimized medication management  Interventions: . Contacted [employer]. Patient has been APPROVED for Jardiance assistance through 02/10/20. I informed the rep that patient is getting ready to run out of medication; she noted she will try to have the medication shipped today, and hopefully will arrive in the next 2-5 business days.  . Contacted patient, explained the above. She verbalized understanding and appreciation. Discussed that we could switch from IR to ER metformin to see if GI upset resolves. Counseled on previous metformin ER recall, but that current lot/manufacturers being dispensed have not been affected by the recall. She verbalized understanding.   Patient Self Care Activities:  . Patient will check blood glucose BID, document, and provide at future appointments . Patient will take medications as  prescribed . Patient will report any questions or concerns to provider   Please see past updates related to this goal by clicking on the "Past Updates" button in the selected goal         The patient verbalized understanding of instructions provided today and declined a print copy of patient instruction materials.   Plan:  - Will outreach patient as previously scheduled for follow up  [name] Feliz Beam, PharmD, [place], CPP Clinical Pharmacist [facility] [employer] [phone]

## 2019-03-29 NOTE — Progress Notes (Signed)
Reviewed information.  Agree with plan.    Dr Serenity Fortner 

## 2019-03-30 ENCOUNTER — Other Ambulatory Visit: Payer: Self-pay | Admitting: Internal Medicine

## 2019-04-04 ENCOUNTER — Ambulatory Visit: Payer: Self-pay

## 2019-04-11 ENCOUNTER — Telehealth: Payer: BLUE CROSS/BLUE SHIELD

## 2019-05-23 ENCOUNTER — Ambulatory Visit: Payer: Self-pay | Admitting: Pharmacist

## 2019-05-23 NOTE — Chronic Care Management (AMB) (Signed)
  Chronic Care Management   Note  05/23/2019 Name: STORM DULSKI MRN: [id number] DOB: [birth year]  America Brown is a 64 y.o. year old female who is a primary care patient of Dale Lynbrook, MD. The CCM team was consulted for assistance with chronic disease management and care coordination needs.    Patient called today to see if there were any patient assistance programs for her blood pressure medication. Reviewed that generic medications do not have assistance programs. Encouraged her to contact her insurance to ensure that she is using a preferred pharmacy to fill.   Also reviewed that she is overdue for follow up with Dr. Lorin Picket. She notes that she will call clinic back tomorrow to schedule an appointment.   [name] Feliz Beam, PharmD, [place], CPP Clinical Pharmacist [facility] [place] [employer] [phone]

## 2019-05-24 NOTE — Progress Notes (Signed)
Reviewed.  Agree with plan.  Staff message sent to [facility] to schedule a follow up appt.    Dr Lorin Picket

## 2019-06-09 ENCOUNTER — Encounter: Payer: Self-pay | Admitting: Pharmacist

## 2019-06-09 ENCOUNTER — Telehealth: Payer: Self-pay

## 2019-06-09 ENCOUNTER — Ambulatory Visit: Payer: Self-pay | Admitting: Pharmacist

## 2019-06-09 DIAGNOSIS — I10 Essential (primary) hypertension: Secondary | ICD-10-CM

## 2019-06-09 DIAGNOSIS — E1165 Type 2 diabetes mellitus with hyperglycemia: Secondary | ICD-10-CM

## 2019-06-09 NOTE — Chronic Care Management (AMB) (Deleted)
  Chronic Care Management   Note  06/09/2019 Name: Amber Decker MRN: [id number] DOB: 03-Jun-1955  Amber Decker is a 64 y.o. year old female who is a primary care patient of Dale Lakehead, MD. The CCM team was consulted for assistance with chronic disease management and care coordination needs.    Attempted to contact patient for medication management review. Cell phone went straight to voicemail, and voicemail was full, so I was unable to leave a message. Home phone number was disconnected. Will send MyChart message as well  Plan: - Will collaborate with Care Guide to outreach to schedule follow up with me   [name] Feliz Beam, PharmD, [place], CPP Clinical Pharmacist [facility] [employer] [phone]

## 2019-06-09 NOTE — Chronic Care Management (AMB) (Signed)
Chronic Care Management   Follow Up Note   06/09/2019 Name: Amber Decker MRN: [id number] DOB: 1956/01/16  Referred by: Einar Pheasant, MD Reason for referral : Chronic Care Management (Medication Management)   Amber Decker is a 64 y.o. year old female who is a primary care patient of Einar Pheasant, MD. The CCM team was consulted for assistance with chronic disease management and care coordination needs.    Contacted patient for medication management review.   Review of patient status, including review of consultants reports, relevant laboratory and other test results, and collaboration with appropriate care team members and the patient's provider was performed as part of comprehensive patient evaluation and provision of chronic care management services.    SDOH (Social Determinants of Health) assessments performed: No See Care Plan activities for detailed interventions related to [facility])     Outpatient Encounter Medications as of 06/09/2019  Medication Sig  . JARDIANCE 10 MG TABS tablet Take 1 tablet by mouth once daily  . metFORMIN (GLUCOPHAGE-XR) 500 MG 24 hr tablet Take 2 tablets (1,000 mg total) by mouth in the morning and at bedtime.  [name] acyclovir (ZOVIRAX) 400 MG tablet TAKE 1 TABLET (400 MG TOTAL) DAILY BY MOUTH.  [name] aspirin EC 81 MG tablet Take 81 mg by mouth daily.  . Biotin 1000 MCG tablet Take 1,000 mcg by mouth daily.  . cyanocobalamin 1000 MCG tablet Take 1,000 mcg by mouth daily.  [name] Calcium (STOOL SOFTENER PO) Take 400 mg by mouth daily as needed.   [name] lisinopril-hydrochlorothiazide (ZESTORETIC) 10-12.5 MG tablet TAKE 1 TABLET BY MOUTH EVERY DAY  . metoprolol tartrate (LOPRESSOR) 25 MG tablet Take 1 tablet by mouth twice daily  . Multiple Vitamins-Minerals (ALIVE WOMENS 50+ PO) Take by mouth every other day.   . Omega-3 Fatty Acids (FISH OIL) 500 MG CAPS Take 1 capsule by mouth daily.   . TURMERIC PO Take 1 tablet by mouth.   No facility-administered  encounter medications on file as of 06/09/2019.     Objective:   Goals Addressed            This Visit's Progress     Patient Stated   . PharmD "I can't afford this medication" (pt-stated)       Current Barriers:  . Diabetes: uncontrolled; complicated by chronic medical conditions including HTN, myasthenia gravis, most recent A1c 7.6% but due for updated labs. Appt next month w/ Dr. Nicki Reaper . Current antihyperglycemic regimen: metformin 1000 mg BID, Jardiance 10 mg daily o APPROVED for Jardiance assistance through 02/10/20 . Current glucose readings:  o Fasting: 120s, occasionally <110, sometimes >150 . Current meal patters:  o Breakfast: banana in a hot dog bun w/ mayo; chicken biscuit, will only eat one piece of biscuit; sometimes glucerna  o Lunch: leftovers from the night before; sandwich, sometimes salad  o Supper: tries to eat earlier, 5-6 pm; often eats w/ her sisters sometimes who cook, but they aren't very carb conscious . Cardiovascular risk reduction: o Current hypertensive regimen: lisinopril 10/12.5 mg daily, metoprolol tartrate 25 mg BID; not checking BP at home o Current hyperlipidemia regimen: none, needs updated lab work. Patient indicates that her father had an MI, but she isn't sure what age. o Current antiplatelet regimen: ASA 81 mg daily  Pharmacist Clinical Goal(s):  [name] Over the next 90 days, patient will work with PharmD and primary care provider to address optimized medication management  Interventions: . Comprehensive medication review performed, medication list updated in  electronic medical record . Inter-disciplinary care team collaboration (see longitudinal plan of care) . Extensive discussion of goal A1c, goal fasting, and goal 2 hour post prandial glucose readings. Encouraged patient to check some post prandial readings to evaluate carbohydrate content of her sugars. Mailing BG log for documentation, encouraged to bring these readings to upcoming PCP  f/u . Reviewed possibility of increasing dose of Jardiance, pending next A1c. Script can be called into [facility].  . Reviewed goal BP. Encouraged patient to consider purchasing a BP cuff to monitor at home. Providing w/ BP log.  . Reviewed indication for statin therapy given T2DM and elevated LDL. Lipid panel ordered for upcoming appointment. Encouraged patient to discuss addition of statin therapy w/ PCP, given DM and family hx.   Patient Self Care Activities:  . Patient will check blood glucose BID, document, and provide at future appointments . Patient will take medications as prescribed . Patient will report any questions or concerns to provider   Please see past updates related to this goal by clicking on the "Past Updates" button in the selected goal          Plan:  - Scheduled f/u call in ~ 8 weeks  [name] Feliz Beam, PharmD, [place], CPP Clinical Pharmacist [facility] [employer] [phone]

## 2019-06-09 NOTE — Patient Instructions (Addendum)
Amber Decker,   It was great talking with you today!  Start checking sugars about 2 hours after meals (you don't have to check after every meal every day, but you can do it sometimes and alternate meals), use the enclosed log to write them down. Bring these with you when you come see Dr. Nicki Reaper next month.   I would recommend purchasing a blood pressure machine (arm cuffs are more accurate than wrist cuffs) and periodically keeping an eye on your blood pressure at home.  I would also recommend you start a medication for your cholesterol, and to reduce your risk of heart attacks and strokes. Dr. Nicki Reaper is going to check your cholesterol on your next lab work, but regardless of the level, we recommend that anyone with diabetes be on this type of medication. If your dad had a heart attack, that increases your risk of having one, and we can reduce the risk of that by being on a statin medication.   Please call me with any questions or concerns! Visit Information  Goals Addressed            This Visit's Progress     Patient Stated   . PharmD "I can't afford this medication" (pt-stated)       Current Barriers:  . Diabetes: uncontrolled; complicated by chronic medical conditions including HTN, myasthenia gravis, most recent A1c 7.6% but due for updated labs. Appt next month w/ Dr. Nicki Reaper . Current antihyperglycemic regimen: metformin 1000 mg BID, Jardiance 10 mg daily o APPROVED for Jardiance assistance through 02/10/20 . Current glucose readings:  o Fasting: 120s, occasionally <110, sometimes >150 . Current meal patters:  o Breakfast: banana in a hot dog bun w/ mayo; chicken biscuit, will only eat one piece of biscuit; sometimes glucerna  o Lunch: leftovers from the night before; sandwich, sometimes salad  o Supper: tries to eat earlier, 5-6 pm; often eats w/ her sisters sometimes who cook, but they aren't very carb conscious . Cardiovascular risk reduction: o Current hypertensive regimen:  lisinopril 10/12.5 mg daily, metoprolol tartrate 25 mg BID; not checking BP at home o Current hyperlipidemia regimen: none, needs updated lab work. Patient indicates that her father had an MI, but she isn't sure what age. o Current antiplatelet regimen: ASA 81 mg daily  Pharmacist Clinical Goal(s):  [name] Over the next 90 days, patient will work with PharmD and primary care provider to address optimized medication management  Interventions: . Comprehensive medication review performed, medication list updated in electronic medical record . Inter-disciplinary care team collaboration (see longitudinal plan of care) . Extensive discussion of goal A1c, goal fasting, and goal 2 hour post prandial glucose readings. Encouraged patient to check some post prandial readings to evaluate carbohydrate content of her sugars. Mailing BG log for documentation, encouraged to bring these readings to upcoming PCP f/u . Reviewed possibility of increasing dose of Jardiance, pending next A1c. Script can be called into [facility].  . Reviewed goal BP. Encouraged patient to consider purchasing a BP cuff to monitor at home. Providing w/ BP log.  . Reviewed indication for statin therapy given T2DM and elevated LDL. Lipid panel ordered for upcoming appointment. Encouraged patient to discuss addition of statin therapy w/ PCP, given DM and family hx.   Patient Self Care Activities:  . Patient will check blood glucose BID, document, and provide at future appointments . Patient will take medications as prescribed . Patient will report any questions or concerns to provider   Please  see past updates related to this goal by clicking on the "Past Updates" button in the selected goal         Patient verbalizes understanding of instructions provided today.   Plan:  - Scheduled f/u call in ~ 8 weeks  [name] Feliz Beam, PharmD, [place], CPP Clinical Pharmacist [facility] [employer] [phone]

## 2019-06-10 NOTE — Progress Notes (Signed)
Reviewed information. Agree with plan.  Will discuss statin therapy.    Dr Lorin Picket

## 2019-06-30 ENCOUNTER — Ambulatory Visit (INDEPENDENT_AMBULATORY_CARE_PROVIDER_SITE_OTHER): Payer: 59 | Admitting: Internal Medicine

## 2019-06-30 ENCOUNTER — Other Ambulatory Visit: Payer: Self-pay

## 2019-06-30 ENCOUNTER — Encounter: Payer: Self-pay | Admitting: Internal Medicine

## 2019-06-30 DIAGNOSIS — Z1231 Encounter for screening mammogram for malignant neoplasm of breast: Secondary | ICD-10-CM | POA: Diagnosis not present

## 2019-06-30 DIAGNOSIS — Z6841 Body Mass Index (BMI) 40.0 and over, adult: Secondary | ICD-10-CM

## 2019-06-30 DIAGNOSIS — I10 Essential (primary) hypertension: Secondary | ICD-10-CM | POA: Diagnosis not present

## 2019-06-30 DIAGNOSIS — Z1211 Encounter for screening for malignant neoplasm of colon: Secondary | ICD-10-CM

## 2019-06-30 DIAGNOSIS — E1165 Type 2 diabetes mellitus with hyperglycemia: Secondary | ICD-10-CM

## 2019-06-30 DIAGNOSIS — G7000 Myasthenia gravis without (acute) exacerbation: Secondary | ICD-10-CM | POA: Diagnosis not present

## 2019-06-30 DIAGNOSIS — I471 Supraventricular tachycardia: Secondary | ICD-10-CM | POA: Diagnosis not present

## 2019-06-30 DIAGNOSIS — D72819 Decreased white blood cell count, unspecified: Secondary | ICD-10-CM

## 2019-06-30 DIAGNOSIS — R234 Changes in skin texture: Secondary | ICD-10-CM

## 2019-06-30 NOTE — Progress Notes (Signed)
Patient ID: Fayrene Helper, female   DOB: November 12, 1955, 64 y.o.   MRN: [id number]   Subjective:    Patient ID: Fayrene Helper, female    DOB: 05-31-1955, 64 y.o.   MRN: [id number]  HPI This visit occurred during the SARS-CoV-2 public health emergency.  Safety protocols were in place, including screening questions prior to the visit, additional usage of staff PPE, and extensive cleaning of exam room while observing appropriate contact time as indicated for disinfecting solutions.  Patient here for a scheduled follow up.  She reports she is doing relatively well.  Here to follow up on diabetes and hypertension.  She is on Jardiance and metformin.  Reviewed outside sugar readings.  Sugars averaging mostly 100-140s.  Discussed low carb diet and exercise.  She is walking.  Some fatigue.  No chest pain or sob reported.  No abdominal pain.  Bowels moving.  Blood pressure doing well.  Cracked skin - feet.  Needs colon screening.    Past Medical History:  Diagnosis Date  . Diabetes mellitus without complication ([place]) 7793  . Herpes   . Hypertension   . Myasthenia gravis [facility])    onset age 29's?  . SVT (supraventricular tachycardia) ([place])    Past Surgical History:  Procedure Laterality Date  . MYOMECTOMY    . UTERINE FIBROID SURGERY     at [facility] History  Problem Relation Age of Onset  . Diabetes Mother   . Alcohol abuse Father   . Heart disease Father        myocardial infarction  . Breast cancer Neg Hx   . Colon cancer Neg Hx    Social History   Socioeconomic History  . Marital status: Divorced    Spouse name: Not on file  . Number of children: 0  . Years of education: Not on file  . Highest education level: Not on file  Occupational History    Employer: OTHER  Tobacco Use  . Smoking status: Never Smoker  . Smokeless tobacco: Never Used  Substance and Sexual Activity  . Alcohol use: Yes    Alcohol/week: 0.0 standard drinks    Comment: occasionally  . Drug use: No  .  Sexual activity: Not on file  Other Topics Concern  . Not on file  Social History Narrative  . Not on file   Social Determinants of Health   Financial Resource Strain: Medium Risk  . Difficulty of Paying Living Expenses: Somewhat hard  Food Insecurity:   . Worried About Charity fundraiser in the Last Year:   . Arboriculturist in the Last Year:   Transportation Needs:   . Film/video editor (Medical):   [name] Lack of Transportation (Non-Medical):   Physical Activity:   . Days of Exercise per Week:   . Minutes of Exercise per Session:   Stress:   . Feeling of Stress :   Social Connections:   . Frequency of Communication with Friends and Family:   . Frequency of Social Gatherings with Friends and Family:   . Attends Religious Services:   . Active Member of Clubs or Organizations:   . Attends Archivist Meetings:   [name] Marital Status:     Outpatient Encounter Medications as of 06/30/2019  Medication Sig  . acyclovir (ZOVIRAX) 400 MG tablet TAKE 1 TABLET (400 MG TOTAL) DAILY BY MOUTH.  [name] aspirin EC 81 MG tablet Take 81 mg by mouth daily.  . Biotin 1000  MCG tablet Take 1,000 mcg by mouth daily.  . cyanocobalamin 1000 MCG tablet Take 1,000 mcg by mouth daily.  [name] Calcium (STOOL SOFTENER PO) Take 400 mg by mouth daily as needed.   [name] JARDIANCE 10 MG TABS tablet Take 1 tablet by mouth once daily  . lisinopril-hydrochlorothiazide (ZESTORETIC) 10-12.5 MG tablet TAKE 1 TABLET BY MOUTH EVERY DAY  . metFORMIN (GLUCOPHAGE-XR) 500 MG 24 hr tablet Take 2 tablets (1,000 mg total) by mouth in the morning and at bedtime.  . metoprolol tartrate (LOPRESSOR) 25 MG tablet Take 1 tablet by mouth twice daily  . Multiple Vitamins-Minerals (ALIVE WOMENS 50+ PO) Take by mouth every other day.   . Omega-3 Fatty Acids (FISH OIL) 500 MG CAPS Take 1 capsule by mouth daily.   . TURMERIC PO Take 1 tablet by mouth.   No facility-administered encounter medications on file as of 06/30/2019.     Review of Systems  Constitutional: Negative for appetite change and unexpected weight change.  HENT: Negative for congestion and sinus pressure.   Respiratory: Negative for cough, chest tightness and shortness of breath.   Cardiovascular: Negative for chest pain, palpitations and leg swelling.  Gastrointestinal: Negative for abdominal pain, diarrhea, nausea and vomiting.  Genitourinary: Negative for difficulty urinating and dysuria.  Musculoskeletal: Negative for joint swelling and myalgias.  Skin: Negative for color change and rash.       Cracked skin on feet.   Neurological: Negative for dizziness, light-headedness and headaches.  Psychiatric/Behavioral: Negative for agitation and dysphoric mood.       Objective:    Physical Exam Vitals reviewed.  Constitutional:      Appearance: Normal appearance.  HENT:     Head: Normocephalic and atraumatic.     Right Ear: External ear normal.     Left Ear: External ear normal.  Eyes:     General: No scleral icterus.       Right eye: No discharge.        Left eye: No discharge.     Conjunctiva/sclera: Conjunctivae normal.  Neck:     Thyroid: No thyromegaly.  Cardiovascular:     Rate and Rhythm: Normal rate and regular rhythm.  Pulmonary:     Effort: No respiratory distress.     Breath sounds: Normal breath sounds. No wheezing.  Abdominal:     General: Bowel sounds are normal.     Palpations: Abdomen is soft.     Tenderness: There is no abdominal tenderness.  Musculoskeletal:        General: No swelling or tenderness.     Cervical back: Neck supple.  Lymphadenopathy:     Cervical: No cervical adenopathy.  Skin:    Coloration: Skin is not pale.     Findings: No erythema.     Comments: Skin cracking - plantar surface of feet.    Neurological:     Mental Status: She is alert.  Psychiatric:        Mood and Affect: Mood normal.        Behavior: Behavior normal.     BP 118/72   Pulse 71   Temp (!) 97.1 F (36.2 C)    Resp 16   Ht '5\' 9"'$  (1.753 m)   Wt 275 lb 3.2 oz (124.8 kg)   LMP 02/21/2012   SpO2 98%   BMI 40.64 kg/m  Wt Readings from Last 3 Encounters:  06/30/19 275 lb 3.2 oz (124.8 kg)  02/23/18 270 lb 9.6 oz (122.7 kg)  12/28/17 274 lb 12.8 oz (124.6 kg)     Lab Results  Component Value Date   WBC 3.7 (L) 06/30/2019   HGB 13.9 06/30/2019   HCT 41.6 06/30/2019   PLT 190.0 06/30/2019   GLUCOSE 142 (H) 06/30/2019   CHOL 186 06/30/2019   TRIG 124.0 06/30/2019   HDL 64.20 06/30/2019   LDLCALC 97 06/30/2019   ALT 12 06/30/2019   AST 15 06/30/2019   NA 138 06/30/2019   K 3.9 06/30/2019   CL 106 06/30/2019   CREATININE 1.09 06/30/2019   BUN 22 06/30/2019   CO2 26 06/30/2019   TSH 1.32 06/30/2019   HGBA1C 7.4 (H) 06/30/2019   MICROALBUR <0.7 06/30/2019    MM DIAG BREAST TOMO BILATERAL  Result Date: 12/25/2017 CLINICAL DATA:  One year follow-up of probably benign nodule in the lower outer quadrant of the left breast. Annual examination of both breasts. EXAM: DIGITAL DIAGNOSTIC BILATERAL MAMMOGRAM WITH CAD AND TOMO COMPARISON:  Previous exam(s). ACR Breast Density Category b: There are scattered areas of fibroglandular density. FINDINGS: Stable circumscribed nodule in the lower outer quadrant of left breast. Mildly nodular parenchymal pattern bilaterally is stable. No mass, distortion, or suspicious microcalcification is identified in either breast to suggest malignancy. Mammographic images were processed with CAD. IMPRESSION: No evidence of malignancy in either breast. RECOMMENDATION: Screening mammogram in one year.(Code:SM-B-01Y) I have discussed the findings and recommendations with the patient. Results were also provided in writing at the conclusion of the visit. If applicable, a reminder letter will be sent to the patient regarding the next appointment. BI-RADS CATEGORY  2: Benign. Electronically Signed   By: Curlene Dolphin M.D.   On: 12/25/2017 15:57       Assessment & Plan:    Problem List Items Addressed This Visit    BMI 40.0-44.9, adult ([place])    Discussed diet and exercise.  Follow.       Colon cancer screening    Discussed the need for colon cancer screening.  Ok with referral.  Request Dr Alice Reichert.        Relevant Orders   Ambulatory referral to Gastroenterology   Cracked skin on feet    Lotrimin cream as directed.  Follow.        Essential hypertension, benign    Blood pressure doing well on lisinopril/hctz and metoprolol.  Follow pressures.  Follow metabolic panel.       Relevant Orders   CBC with Differential/Platelet (Completed)   TSH (Completed)   Leukopenia    White count stable last check - 3.7.  Follow.        Myasthenia gravis ([place])    Stable.        SVT (supraventricular tachycardia) ([facility])    On metoprolol and doing well.  Currently without symptoms.  Follow.        Type 2 diabetes mellitus with hyperglycemia ([facility])    Sugars reviewed as outlined.  Low carb diet and exercise.  Follow met b and a1c.       Relevant Orders   Hemoglobin A1c (Completed)   Comprehensive metabolic panel (Completed)   Lipid panel (Completed)   Microalbumin / creatinine urine ratio (Completed)    Other Visit Diagnoses    Encounter for screening mammogram for malignant neoplasm of breast       Relevant Orders   MM 3D SCREEN BREAST BILATERAL       Einar Pheasant, MD

## 2019-06-30 NOTE — Patient Instructions (Signed)
Lotrimin cream - apply to affected area bid.

## 2019-07-01 LAB — CBC WITH DIFFERENTIAL/PLATELET
Basophils Absolute: 0 10*3/uL (ref 0.0–0.1)
Basophils Relative: 1 % (ref 0.0–3.0)
Eosinophils Absolute: 0.1 10*3/uL (ref 0.0–0.7)
Eosinophils Relative: 2.8 % (ref 0.0–5.0)
HCT: 41.6 % (ref 36.0–46.0)
Hemoglobin: 13.9 g/dL (ref 12.0–15.0)
Lymphocytes Relative: 47.7 % — ABNORMAL HIGH (ref 12.0–46.0)
Lymphs Abs: 1.8 10*3/uL (ref 0.7–4.0)
MCHC: 33.5 g/dL (ref 30.0–36.0)
MCV: 91 fl (ref 78.0–100.0)
Monocytes Absolute: 0.4 10*3/uL (ref 0.1–1.0)
Monocytes Relative: 11.3 % (ref 3.0–12.0)
Neutro Abs: 1.4 10*3/uL (ref 1.4–7.7)
Neutrophils Relative %: 37.2 % — ABNORMAL LOW (ref 43.0–77.0)
Platelets: 190 10*3/uL (ref 150.0–400.0)
RBC: 4.58 Mil/uL (ref 3.87–5.11)
RDW: 13.7 % (ref 11.5–15.5)
WBC: 3.7 10*3/uL — ABNORMAL LOW (ref 4.0–10.5)

## 2019-07-01 LAB — COMPREHENSIVE METABOLIC PANEL
ALT: 12 U/L (ref 0–35)
AST: 15 U/L (ref 0–37)
Albumin: 4.4 g/dL (ref 3.5–5.2)
Alkaline Phosphatase: 47 U/L (ref 39–117)
BUN: 22 mg/dL (ref 6–23)
CO2: 26 mEq/L (ref 19–32)
Calcium: 9.6 mg/dL (ref 8.4–10.5)
Chloride: 106 mEq/L (ref 96–112)
Creatinine, Ser: 1.09 mg/dL (ref 0.40–1.20)
GFR: 61.35 mL/min (ref >60.00)
Glucose, Bld: 142 mg/dL — ABNORMAL HIGH (ref 70–99)
Potassium: 3.9 mEq/L (ref 3.5–5.1)
Sodium: 138 mEq/L (ref 135–145)
Total Bilirubin: 0.4 mg/dL (ref 0.2–1.2)
Total Protein: 7.5 g/dL (ref 6.0–8.3)

## 2019-07-01 LAB — LIPID PANEL
Cholesterol: 186 mg/dL (ref 0–200)
HDL: 64.2 mg/dL (ref >39.00)
LDL Cholesterol: 97 mg/dL (ref 0–99)
NonHDL: 121.35
Total CHOL/HDL Ratio: 3
Triglycerides: 124 mg/dL (ref 0.0–149.0)
VLDL: 24.8 mg/dL (ref 0.0–40.0)

## 2019-07-01 LAB — TSH: TSH: 1.32 u[IU]/mL (ref 0.35–4.50)

## 2019-07-01 LAB — MICROALBUMIN / CREATININE URINE RATIO
Creatinine,U: 73.2 mg/dL
Microalb Creat Ratio: 1 mg/g (ref 0.0–30.0)
Microalb, Ur: <0.7 mg/dL (ref 0.0–1.9)

## 2019-07-01 LAB — HEMOGLOBIN A1C: Hgb A1c MFr Bld: 7.4 % — ABNORMAL HIGH (ref 4.6–6.5)

## 2019-07-03 ENCOUNTER — Encounter: Payer: Self-pay | Admitting: Internal Medicine

## 2019-07-03 ENCOUNTER — Other Ambulatory Visit: Payer: Self-pay | Admitting: Internal Medicine

## 2019-07-03 DIAGNOSIS — R234 Changes in skin texture: Secondary | ICD-10-CM | POA: Insufficient documentation

## 2019-07-03 NOTE — Assessment & Plan Note (Signed)
Sugars reviewed as outlined.  Low carb diet and exercise.  Follow met b and a1c.

## 2019-07-03 NOTE — Assessment & Plan Note (Signed)
Discussed the need for colon cancer screening.  Ok with referral.  Request Dr Norma Fredrickson.

## 2019-07-03 NOTE — Assessment & Plan Note (Signed)
Stable

## 2019-07-03 NOTE — Assessment & Plan Note (Addendum)
Discussed diet and exercise.  Follow.  

## 2019-07-03 NOTE — Assessment & Plan Note (Signed)
White count stable last check - 3.7.  Follow.

## 2019-07-03 NOTE — Assessment & Plan Note (Signed)
Blood pressure doing well on lisinopril/hctz and metoprolol.  Follow pressures.  Follow metabolic panel.   

## 2019-07-03 NOTE — Assessment & Plan Note (Signed)
Lotrimin cream as directed.  Follow.    

## 2019-07-03 NOTE — Assessment & Plan Note (Signed)
On metoprolol and doing well.  Currently without symptoms.  Follow.

## 2019-07-07 ENCOUNTER — Other Ambulatory Visit: Payer: Self-pay | Admitting: Internal Medicine

## 2019-07-07 ENCOUNTER — Ambulatory Visit
Admission: RE | Admit: 2019-07-07 | Discharge: 2019-07-07 | Disposition: A | Discharge status: Home / Self Care | Payer: 59 | Source: Ambulatory Visit | Attending: Internal Medicine | Admitting: Internal Medicine

## 2019-07-07 DIAGNOSIS — R921 Mammographic calcification found on diagnostic imaging of breast: Secondary | ICD-10-CM

## 2019-07-07 DIAGNOSIS — Z1231 Encounter for screening mammogram for malignant neoplasm of breast: Secondary | ICD-10-CM | POA: Insufficient documentation

## 2019-07-07 DIAGNOSIS — N6320 Unspecified lump in the left breast, unspecified quadrant: Secondary | ICD-10-CM

## 2019-07-07 DIAGNOSIS — R928 Other abnormal and inconclusive findings on diagnostic imaging of breast: Secondary | ICD-10-CM

## 2019-07-12 ENCOUNTER — Ambulatory Visit: Payer: Self-pay | Admitting: Pharmacist

## 2019-07-12 ENCOUNTER — Telehealth: Payer: Self-pay | Admitting: Pharmacist

## 2019-07-12 DIAGNOSIS — E1165 Type 2 diabetes mellitus with hyperglycemia: Secondary | ICD-10-CM

## 2019-07-12 NOTE — Telephone Encounter (Signed)
Patient is currently out of Jardiance 10 mg daily. Refill from [facility] patient assistance will ship on Thursday, 6/3, and can take 5-7 business day to arrive.   Can we pull 2 bottles (#14) Jardiance 10 mg tabs as samples for her, and call her to let her know when ready? Thanks!

## 2019-07-12 NOTE — Telephone Encounter (Signed)
Patient aware and coming to pick up medication. Stated she will be here this afternoon or in the morning.

## 2019-07-12 NOTE — Chronic Care Management (AMB) (Signed)
Chronic Care Management   Follow Up Note   07/12/2019 Name: SHANESSA HODAK MRN: [id number] DOB: 1955-06-27  Referred by: Einar Pheasant, MD Reason for referral : Chronic Care Management (Medication Management)   REMMIE BEMBENEK is a 64 y.o. year old female who is a primary care patient of Einar Pheasant, MD. The CCM team was consulted for assistance with chronic disease management and care coordination needs.    Review of patient status, including review of consultants reports, relevant laboratory and other test results, and collaboration with appropriate care team members and the patient's provider was performed as part of comprehensive patient evaluation and provision of chronic care management services.    SDOH (Social Determinants of Health) assessments performed: Yes See Care Plan activities for detailed interventions related to [facility])     Outpatient Encounter Medications as of 07/12/2019  Medication Sig  . acyclovir (ZOVIRAX) 400 MG tablet TAKE 1 TABLET (400 MG TOTAL) DAILY BY MOUTH.  [name] aspirin EC 81 MG tablet Take 81 mg by mouth daily.  . Biotin 1000 MCG tablet Take 1,000 mcg by mouth daily.  . cyanocobalamin 1000 MCG tablet Take 1,000 mcg by mouth daily.  [name] Calcium (STOOL SOFTENER PO) Take 400 mg by mouth daily as needed.   [name] JARDIANCE 10 MG TABS tablet Take 1 tablet by mouth once daily  . lisinopril-hydrochlorothiazide (ZESTORETIC) 10-12.5 MG tablet TAKE 1 TABLET BY MOUTH EVERY DAY  . metFORMIN (GLUCOPHAGE-XR) 500 MG 24 hr tablet Take 2 tablets (1,000 mg total) by mouth in the morning and at bedtime.  . metoprolol tartrate (LOPRESSOR) 25 MG tablet Take 1 tablet by mouth twice daily  . Multiple Vitamins-Minerals (ALIVE WOMENS 50+ PO) Take by mouth every other day.   . Omega-3 Fatty Acids (FISH OIL) 500 MG CAPS Take 1 capsule by mouth daily.   . TURMERIC PO Take 1 tablet by mouth.   No facility-administered encounter medications on file as of 07/12/2019.      Objective:   Goals Addressed            This Visit's Progress     Patient Stated   . PharmD "I can't afford this medication" (pt-stated)       Current Barriers:  . Diabetes: uncontrolled; complicated by chronic medical conditions including HTN, myasthenia gravis, most recent A1c 7.4% (declined dose increase) o Patient LVM noting that she had not received Jardiance from [facility]. Has been out of medication x2 days . Current antihyperglycemic regimen: metformin 1000 mg BID, Jardiance 10 mg daily o APPROVED for Jardiance assistance through 02/10/20 . Cardiovascular risk reduction: o Current hypertensive regimen: lisinopril 10/12.5 mg daily, metoprolol tartrate 25 mg BID; o Current hyperlipidemia regimen: none, needs updated lab work. Patient indicates that her father had an MI, but she isn't sure what age. o Current antiplatelet regimen: ASA 81 mg daily  Pharmacist Clinical Goal(s):  [name] Over the next 90 days, patient will work with PharmD and primary care provider to address optimized medication management  Interventions: . Comprehensive medication review performed, medication list updated in electronic medical record . Inter-disciplinary care team collaboration (see longitudinal plan of care) . Reviewed that she needs to call [facility] to request refill. I called [facility], placed refill request. Will be shipped out Thursdat 6/3, should arrive in 5-7 business days. Will collaborate w/ office staff to prepare sample of Jardiance to tide patient over until her shipment arrives   Patient Self Care Activities:  . Patient will check blood glucose BID,  document, and provide at future appointments . Patient will take medications as prescribed . Patient will report any questions or concerns to provider   Please see past updates related to this goal by clicking on the "Past Updates" button in the selected goal          Plan:  - Will outreach as previously scheduled  [name] Feliz Beam, PharmD, [place],  CPP Clinical Pharmacist [facility] [employer] [phone]

## 2019-07-12 NOTE — Patient Instructions (Signed)
Visit Information  Goals Addressed            This Visit's Progress     Patient Stated   . PharmD "I can't afford this medication" (pt-stated)       Current Barriers:  . Diabetes: uncontrolled; complicated by chronic medical conditions including HTN, myasthenia gravis, most recent A1c 7.4% (declined dose increase) o Patient LVM noting that she had not received Jardiance from [facility]. Has been out of medication x2 days . Current antihyperglycemic regimen: metformin 1000 mg BID, Jardiance 10 mg daily o APPROVED for Jardiance assistance through 02/10/20 . Cardiovascular risk reduction: o Current hypertensive regimen: lisinopril 10/12.5 mg daily, metoprolol tartrate 25 mg BID; o Current hyperlipidemia regimen: none, needs updated lab work. Patient indicates that her father had an MI, but she isn't sure what age. o Current antiplatelet regimen: ASA 81 mg daily  Pharmacist Clinical Goal(s):  [name] Over the next 90 days, patient will work with PharmD and primary care provider to address optimized medication management  Interventions: . Comprehensive medication review performed, medication list updated in electronic medical record . Inter-disciplinary care team collaboration (see longitudinal plan of care) . Reviewed that she needs to call [facility] to request refill. I called [facility], placed refill request. Will be shipped out Thursdat 6/3, should arrive in 5-7 business days. Will collaborate w/ office staff to prepare sample of Jardiance to tide patient over until her shipment arrives   Patient Self Care Activities:  . Patient will check blood glucose BID, document, and provide at future appointments . Patient will take medications as prescribed . Patient will report any questions or concerns to provider   Please see past updates related to this goal by clicking on the "Past Updates" button in the selected goal         Patient verbalizes understanding of instructions provided today.    Plan:  - Will  outreach as previously scheduled  [name] Feliz Beam, PharmD, [place], CPP Clinical Pharmacist [facility] [employer] [phone]

## 2019-07-12 NOTE — Telephone Encounter (Signed)
Medication Samples have been provided to the patient.  Drug name: Jardiance       Strength: 10 mg        Qty: 2 boxes  LOT: [id number]  Exp.Date: 07/2021  Dosing instructions: Amber Decker; DOB: [birth year] JARDIANCE 10 MG TABS tablet      Sig: Take 1 tablet by mouth once daily    The patient has been instructed regarding the correct time, dose, and frequency of taking this medication, including desired effects and most common side effects.   [name] 4:00 PM 07/12/2019

## 2019-07-15 NOTE — Telephone Encounter (Signed)
Patient picked up.

## 2019-07-20 ENCOUNTER — Telehealth: Payer: Self-pay | Admitting: *Deleted

## 2019-07-20 NOTE — Telephone Encounter (Signed)
Attempts to call pt to schedule AV on 6/1 & 6/3 - vm box full on mobile # - no answer no vm on home #.  2nd result letter w/ contact information sent out on 6/1.  No response back - sending certified letter to pt

## 2019-08-04 ENCOUNTER — Ambulatory Visit: Payer: 59 | Admitting: Pharmacist

## 2019-08-04 DIAGNOSIS — E1165 Type 2 diabetes mellitus with hyperglycemia: Secondary | ICD-10-CM

## 2019-08-04 NOTE — Progress Notes (Signed)
I have reviewed the above note and agree. I was available to the pharmacist for consultation.  Skye Plamondon, MD 

## 2019-08-04 NOTE — Patient Instructions (Addendum)
Amber Decker,   It was great talking to you today!  Focus on working up to exercising 4-5 times weekly. The goal is a total of 150 minutes of exercise weekly.   Remember, for an A1c of <7%, we want to see fasting sugars <130 and 2 hour after meal sugars <180. If we don't reach these goals with you increasing exercise, we should increase Jardiance to 25 mg daily. We can still get that for free from the drug company.   We also talked about a medication for your cholesterol. Regardless of your cholesterol levels, we recommend that all patients with diabetes be on this type of medication to reduce your risk of heart attacks and strokes. Please think about this, and we can talk about it again when we talk in August.   Take care,   [name] Feliz Beam, PharmD [phone]  Visit Information  Goals Addressed              This Visit's Progress     Patient Stated   .  PharmD "I can't afford this medication" (pt-stated)        Current Barriers:  . Diabetes: uncontrolled; complicated by chronic medical conditions including HTN, myasthenia gravis, most recent A1c 7.4%  . Current antihyperglycemic regimen: metformin 1000 mg BID, Jardiance 10 mg daily o APPROVED for Jardiance assistance through 02/10/20 . Current glucose:  o Fasting: 120-130s, 1-2 above 130s  o Rarely checking after meals . Current physical activity:  o Walks occasionally when weather permits, reports a goal of walking 4-5 days per week  . Cardiovascular risk reduction: o Current hypertensive regimen: lisinopril 10/12.5 mg daily, metoprolol tartrate 25 mg BID; o Current hyperlipidemia regimen: none, needs updated lab work. Patient indicates that her father had an MI, but she isn't sure what age. o Current antiplatelet regimen: ASA 81 mg daily  Pharmacist Clinical Goal(s):  [name] Over the next 90 days, patient will work with PharmD and primary care provider to address optimized medication management  Interventions: . Comprehensive  medication review performed, medication list updated in electronic medical record . Inter-disciplinary care team collaboration (see longitudinal plan of care) . Discussed goal A1c, goal fasting, and goal 2 hour post prandial glucose.  [name] Recommended increasing Jardiance to 25 mg daily. Patient declines at this time, she would like to increase exercise and see how sugars do. Set a goal of exercising 4 days per week, eventual goal of 150 minutes of moderate intensity exercise weekly. Agreed that if sugars not at goal at next call in August, we will increase Jardiance to 25 mg daily.  . Discussed statin therapy. Discussed recommendation for statin therapy for DM, regardless of LDL, and discussed pleiotropic benefits of statins. Patient wanted to wait until she starts exercising to see if she "needs" statin. Reviewed that it will still be a recommendation, and that I would recommend rosuvastatin 5 mg daily. Patient declines at this time, would like to think about. Will mail information  Patient Self Care Activities:  . Patient will check blood glucose BID, document, and provide at future appointments . Patient will take medications as prescribed . Patient will report any questions or concerns to provider   Please see past updates related to this goal by clicking on the "Past Updates" button in the selected goal         The patient verbalized understanding of instructions provided today and agreed to receive a mailed copy of patient instruction and/or educational materials.   Plan:  -  Scheduled f/u call in ~ 8 weeks  [name] Darnelle Maffucci, PharmD, [place], [place] Pharmacist [place] [phone]

## 2019-08-04 NOTE — Chronic Care Management (AMB) (Signed)
Chronic Care Management   Follow Up Note   08/04/2019 Name: REMINGTON SKALSKY MRN: [id number] DOB: 12-26-1955  Referred by: Dale Pescadero, MD Reason for referral : Chronic Care Management (Medication Management)   Amber Decker is a 64 y.o. year old female who is a primary care patient of Dale Story, MD. The CCM team was consulted for assistance with chronic disease management and care coordination needs.    Contacted patient for medication management review.   Review of patient status, including review of consultants reports, relevant laboratory and other test results, and collaboration with appropriate care team members and the patient's provider was performed as part of comprehensive patient evaluation and provision of chronic care management services.    SDOH (Social Determinants of Health) assessments performed: No See Care Plan activities for detailed interventions related to [facility])     Outpatient Encounter Medications as of 08/04/2019  Medication Sig  . aspirin EC 81 MG tablet Take 81 mg by mouth daily.  . Biotin 1000 MCG tablet Take 1,000 mcg by mouth daily.  . cyanocobalamin 1000 MCG tablet Take 1,000 mcg by mouth daily.  Tery Sanfilippo Calcium (STOOL SOFTENER PO) Take 400 mg by mouth daily as needed.   [name] JARDIANCE 10 MG TABS tablet Take 1 tablet by mouth once daily  . lisinopril-hydrochlorothiazide (ZESTORETIC) 10-12.5 MG tablet TAKE 1 TABLET BY MOUTH EVERY DAY  . metFORMIN (GLUCOPHAGE-XR) 500 MG 24 hr tablet Take 2 tablets (1,000 mg total) by mouth in the morning and at bedtime.  . metoprolol tartrate (LOPRESSOR) 25 MG tablet Take 1 tablet by mouth twice daily  . Multiple Vitamins-Minerals (ALIVE WOMENS 50+ PO) Take by mouth every other day.   . Omega-3 Fatty Acids (FISH OIL) 500 MG CAPS Take 1 capsule by mouth daily.   [name] acyclovir (ZOVIRAX) 400 MG tablet TAKE 1 TABLET (400 MG TOTAL) DAILY BY MOUTH.  . TURMERIC PO Take 1 tablet by mouth.   No facility-administered  encounter medications on file as of 08/04/2019.     Objective:   Goals Addressed              This Visit's Progress     Patient Stated   .  PharmD "I can't afford this medication" (pt-stated)        Current Barriers:  . Diabetes: uncontrolled; complicated by chronic medical conditions including HTN, myasthenia gravis, most recent A1c 7.4%  . Current antihyperglycemic regimen: metformin 1000 mg BID, Jardiance 10 mg daily o APPROVED for Jardiance assistance through 02/10/20 . Current glucose:  o Fasting: 120-130s, 1-2 above 130s  o Rarely checking after meals . Current physical activity:  o Walks occasionally when weather permits, reports a goal of walking 4-5 days per week  . Cardiovascular risk reduction: o Current hypertensive regimen: lisinopril 10/12.5 mg daily, metoprolol tartrate 25 mg BID; o Current hyperlipidemia regimen: none, needs updated lab work. Patient indicates that her father had an MI, but she isn't sure what age. o Current antiplatelet regimen: ASA 81 mg daily  Pharmacist Clinical Goal(s):  [name] Over the next 90 days, patient will work with PharmD and primary care provider to address optimized medication management  Interventions: . Comprehensive medication review performed, medication list updated in electronic medical record . Inter-disciplinary care team collaboration (see longitudinal plan of care) . Discussed goal A1c, goal fasting, and goal 2 hour post prandial glucose.  [name] Recommended increasing Jardiance to 25 mg daily. Patient declines at this time, she would like to increase exercise  and see how sugars do. Set a goal of exercising 4 days per week, eventual goal of 150 minutes of moderate intensity exercise weekly. Agreed that if sugars not at goal at next call in August, we will increase Jardiance to 25 mg daily.  . Discussed statin therapy. Discussed recommendation for statin therapy for DM, regardless of LDL, and discussed pleiotropic benefits of statins.  Patient wanted to wait until she starts exercising to see if she "needs" statin. Reviewed that it will still be a recommendation, and that I would recommend rosuvastatin 5 mg daily. Patient declines at this time, would like to think about. Will mail information  Patient Self Care Activities:  . Patient will check blood glucose BID, document, and provide at future appointments . Patient will take medications as prescribed . Patient will report any questions or concerns to provider   Please see past updates related to this goal by clicking on the "Past Updates" button in the selected goal          Plan:  - Scheduled f/u call in ~ 8 weeks  [name] Darnelle Maffucci, PharmD, [place], [place] Pharmacist [place] [phone]

## 2019-08-16 ENCOUNTER — Other Ambulatory Visit: Payer: Self-pay | Admitting: Internal Medicine

## 2019-10-01 ENCOUNTER — Other Ambulatory Visit: Payer: Self-pay | Admitting: Internal Medicine

## 2019-10-06 ENCOUNTER — Ambulatory Visit: Payer: 59 | Admitting: Pharmacist

## 2019-10-06 DIAGNOSIS — E1165 Type 2 diabetes mellitus with hyperglycemia: Secondary | ICD-10-CM

## 2019-10-06 DIAGNOSIS — G7000 Myasthenia gravis without (acute) exacerbation: Secondary | ICD-10-CM

## 2019-10-06 NOTE — Chronic Care Management (AMB) (Signed)
Chronic Care Management   Follow Up Note   10/06/2019 Name: Amber Decker MRN: [id number] DOB: 09-Feb-1956  Referred by: Einar Pheasant, MD Reason for referral : Chronic Care Management (Medication Management)   Amber Decker is a 64 y.o. year old female who is a primary care patient of Einar Pheasant, MD. The CCM team was consulted for assistance with chronic disease management and care coordination needs.    Review of patient status, including review of consultants reports, relevant laboratory and other test results, and collaboration with appropriate care team members and the patient's provider was performed as part of comprehensive patient evaluation and provision of chronic care management services.    SDOH (Social Determinants of Health) assessments performed: Yes See Care Plan activities for detailed interventions related to SDOH)  SDOH Interventions     Most Recent Value  SDOH Interventions  Financial Strain Interventions Other (Comment)       Outpatient Encounter Medications as of 10/06/2019  Medication Sig  . acyclovir (ZOVIRAX) 400 MG tablet TAKE 1 TABLET (400 MG TOTAL) DAILY BY MOUTH.  [name] aspirin EC 81 MG tablet Take 81 mg by mouth daily.  [name] Calcium (STOOL SOFTENER PO) Take 400 mg by mouth daily as needed.   [name] JARDIANCE 10 MG TABS tablet Take 1 tablet by mouth once daily  . lisinopril-hydrochlorothiazide (ZESTORETIC) 10-12.5 MG tablet TAKE 1 TABLET BY MOUTH EVERY DAY  . metFORMIN (GLUCOPHAGE-XR) 500 MG 24 hr tablet Take 2 tablets (1,000 mg total) by mouth in the morning and at bedtime.  . metoprolol tartrate (LOPRESSOR) 25 MG tablet Take 1 tablet by mouth twice daily  . Multiple Vitamins-Minerals (ALIVE WOMENS 50+ PO) Take by mouth every other day.   . Omega-3 Fatty Acids (FISH OIL) 500 MG CAPS Take 1 capsule by mouth daily.   . TURMERIC PO Take 1 tablet by mouth.  . [DISCONTINUED] Biotin 1000 MCG tablet Take 1,000 mcg by mouth daily.  . [DISCONTINUED]  cyanocobalamin 1000 MCG tablet Take 1,000 mcg by mouth daily.   No facility-administered encounter medications on file as of 10/06/2019.     Objective:   Goals Addressed              This Visit's Progress     Patient Stated   .  PharmD "I can't afford this medication" (pt-stated)        CARE PLAN ENTRY (see longitudinal plan of care for additional care plan information)  Current Barriers:  . Social, financial, community barriers:  o Notes that one medication was $20 the other day, but cannot remember which one. Upon questioning, reports concern with "costs adding up" - rent, utilities, etc. Works part time for [employer] on [employer].  . Diabetes: uncontrolled; complicated by chronic medical conditions including HTN, SVT, myasthenia gravis, most recent A1c 7.4% . Most recent eGFR: ~62 mL/min . Current antihyperglycemic regimen: metformin XR 1000 mg BID, Jardiance 10 mg daily . Current exercise: very little recently. Remembers our goal of ~150 minutes moderate intensity exercise, but notes that due to work and the weather, she has not wanted to exercise.  . Current blood glucose readings:  o Fasting: 120s o After meals: at goal <180 . Cardiovascular risk reduction: o Current hypertensive regimen: losartan/HCTZ 10/12.5 mg daily, metoprolol tartrate 25 mg BID; o Current hyperlipidemia regimen: none, ASCVD risk 13.5%. Have previously discussed recommendation for statin therapy. Patient asks about OTC cholesterol supplements o Current antiplatelet regimen: aspirin 81 mg daily . Eye exam: DUE, patient  is aware the needs to schedule, plans on scheduling [facility] soon . Nephropathy screening: DUE, pcp appt upcoming next month . Foot exam: DUE, PCP appt upcoming next month . 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 Supplements: fish oil, turmeric  Pharmacist Clinical Goal(s):  [name] Over the next 90 days, patient will work with PharmD and primary care provider to address optimized    Interventions: . Comprehensive medication review performed, medication list updated in electronic medical record . Inter-disciplinary care team collaboration (see longitudinal plan of care) . Praised for improvement in glucose readings. Due for A1c with next PCP visit.  . Reviewed differences in LDL, TG, and corresponding CV risk. Discussed that krill/fish oils are not providing LDL lowering or CV reduction benefit that statins are. Encouraged to discuss w/ PCP at next visit.  [name] Discussed financial concerns. [place]acing Care Guide referral.   Patient Self Care Activities:  . Patient will check blood glucose daily, document, and provide at future appointments . Patient will take medications as prescribed . Patient will contact provider with any episodes of hypoglycemia . Patient will report any questions or concerns to provider   Please see past updates related to this goal by clicking on the "Past Updates" button in the selected goal          Plan:  - Scheduled f/u call in ~ 6 weeks  [name] Darnelle Maffucci, PharmD, [place], [place] Pharmacist [place] [phone]

## 2019-10-06 NOTE — Patient Instructions (Addendum)
Amber Decker,   It was great talking with you today!  I've placed a Care Guide referral to call you about any community/financial resources.   Continue your current regimen. If your next A1c is >7%, I recommend we increase the Jardiance to 25 mg daily.   Talk to Dr. Nicki Reaper about a cholesterol medication to lower your LDL (bad cholesterol) and reduce your risk of heart disease/strokes.   See our scheduled phone call below.   Call me if you have any questions or concerns in the meantime!  [name] Darnelle Maffucci, PharmD [phone]  Visit Information  Goals Addressed              This Visit's Progress     Patient Stated   .  PharmD "I can't afford this medication" (pt-stated)        CARE PLAN ENTRY (see longitudinal plan of care for additional care plan information)  Current Barriers:  . Social, financial, community barriers:  o Notes that one medication was $20 the other day, but cannot remember which one. Upon questioning, reports concern with "costs adding up" - rent, utilities, etc. Works part time for [employer] on [employer].  . Diabetes: uncontrolled; complicated by chronic medical conditions including HTN, SVT, myasthenia gravis, most recent A1c 7.4% . Most recent eGFR: ~62 mL/min . Current antihyperglycemic regimen: metformin XR 1000 mg BID, Jardiance 10 mg daily . Current exercise: very little recently. Remembers our goal of ~150 minutes moderate intensity exercise, but notes that due to work and the weather, she has not wanted to exercise.  . Current blood glucose readings:  o Fasting: 120s o After meals: at goal <180 . Cardiovascular risk reduction: o Current hypertensive regimen: losartan/HCTZ 10/12.5 mg daily, metoprolol tartrate 25 mg BID; o Current hyperlipidemia regimen: none, ASCVD risk 13.5%. Have previously discussed recommendation for statin therapy. Patient asks about OTC cholesterol supplements o Current antiplatelet regimen: aspirin 81 mg daily . Eye exam: DUE, patient is  aware the needs to schedule, plans on scheduling at [facility] soon . Nephropathy screening: DUE, pcp appt upcoming next month . Foot exam: DUE, PCP appt upcoming next month . 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 Supplements: fish oil, turmeric  Pharmacist Clinical Goal(s):  [name] Over the next 90 days, patient will work with PharmD and primary care provider to address optimized   Interventions: . Comprehensive medication review performed, medication list updated in electronic medical record . Inter-disciplinary care team collaboration (see longitudinal plan of care) . Praised for improvement in glucose readings. Due for A1c with next PCP visit.  . Reviewed differences in LDL, TG, and corresponding CV risk. Discussed that krill/fish oils are not providing LDL lowering or CV reduction benefit that statins are. Encouraged to discuss w/ PCP at next visit.  [name] Discussed financial concerns. Placing Care Guide referral.   Patient Self Care Activities:  . Patient will check blood glucose daily, document, and provide at future appointments . Patient will take medications as prescribed . Patient will contact provider with any episodes of hypoglycemia . Patient will report any questions or concerns to provider   Please see past updates related to this goal by clicking on the "Past Updates" button in the selected goal         The patient verbalized understanding of instructions provided today and agreed to receive a mailed copy of patient instruction and/or educational materials.  Plan:  - Scheduled f/u call in ~ 6 weeks  [name] Darnelle Maffucci, PharmD, [place], CPP Clinical Pharmacist [place]  [place] [phone]

## 2019-10-07 NOTE — Progress Notes (Signed)
I have reviewed the above note and agree. I was available to the pharmacist for consultation.  Izekiel Flegel, MD 

## 2019-10-28 ENCOUNTER — Telehealth: Payer: Self-pay

## 2019-10-28 NOTE — Telephone Encounter (Signed)
    MA9/17/2021 1st Attempt  Name: ZARI CLY   MRN: [id number]   DOB: 04-21-1955   AGE: 64 y.o.   GENDER: female   PCP Dale [place], MD.   10/28/19 Unable to leave message on cell voicemail full and no answer at home phone number.  Will call again Monday, Sept. 20.   Kimberlee Shoun, AAS Paralegal, [facility] Care Guide . Embedded Care Coordination [facility]  Care Management  [address] [place], [place] [address] [email or web address]  [phone]   [email or web address]

## 2019-11-01 ENCOUNTER — Other Ambulatory Visit: Payer: Self-pay

## 2019-11-01 ENCOUNTER — Telehealth (INDEPENDENT_AMBULATORY_CARE_PROVIDER_SITE_OTHER): Payer: 59 | Admitting: Internal Medicine

## 2019-11-01 DIAGNOSIS — K5900 Constipation, unspecified: Secondary | ICD-10-CM

## 2019-11-01 DIAGNOSIS — E1165 Type 2 diabetes mellitus with hyperglycemia: Secondary | ICD-10-CM | POA: Diagnosis not present

## 2019-11-01 DIAGNOSIS — I471 Supraventricular tachycardia: Secondary | ICD-10-CM | POA: Diagnosis not present

## 2019-11-01 DIAGNOSIS — I10 Essential (primary) hypertension: Secondary | ICD-10-CM

## 2019-11-01 NOTE — Telephone Encounter (Signed)
    MA9/21/2021 2nd Attempt  Name: [name]   MRN: [id number]   DOB: [birth year]   AGE: 64 y.o.   GENDER: female   PCP Dale Nixon, MD.   11/01/19 Unable to leave message on cell or home voicemail no answer at either number.  Will call again 9/22.    Adalaide Jaskolski, AAS Paralegal, [facility] Care Guide . Embedded Care Coordination [facility]  Care Management  [address] [place], [place] [address] [email or web address]  [phone]   [email or web address]

## 2019-11-01 NOTE — Progress Notes (Signed)
Patient ID: Amber Decker, female   DOB: 06-08-1955, 64 y.o.   MRN: [id number]   Virtual Visit via video Note  This visit type was conducted due to national recommendations for restrictions regarding the COVID-19 pandemic (e.g. social distancing).  This format is felt to be most appropriate for this patient at this time.  All issues noted in this document were discussed and addressed.  No physical exam was performed (except for noted visual exam findings with Video Visits).   I connected with Amber Decker by a video enabled telemedicine application and verified that I am speaking with the correct person using two identifiers. Location patient: home Location provider: work  Persons participating in the virtual visit: patient, provider  The limitations, risks, security and privacy concerns of performing an evaluation and management service by video and the availability of in person appointments have been discussed.  It has also been  discussed with the patient that there may be a patient responsible charge related to this service. The patient expressed understanding and agreed to proceed.   Reason for visit: scheduled follow up.    HPI: Follow up appt for diabetes and hypertension.  She reports she is doing well.  Feels good.  Trying to stay active.  No chest pain or sob.  No acid reflux or abdominal pain reported.  Some constipation.  Taking stool softener.  Discussed miralax.  States am sugars averaging 120-130. Handling stress.  Overall feels she is doing well.     ROS: See pertinent positives and negatives per HPI.  Past Medical History:  Diagnosis Date  . Diabetes mellitus without complication ([place]) 2297  . Herpes   . Hypertension   . Myasthenia gravis [employer])    onset age 23's?  . SVT (supraventricular tachycardia) ([place])     Past Surgical History:  Procedure Laterality Date  . MYOMECTOMY    . UTERINE FIBROID SURGERY     at [facility] History  Problem Relation Age of  Onset  . Diabetes Mother   . Alcohol abuse Father   . Heart disease Father        myocardial infarction  . Breast cancer Neg Hx   . Colon cancer Neg Hx     SOCIAL HX: reviewed.    Current Outpatient Medications:  .  acyclovir (ZOVIRAX) 400 MG tablet, TAKE 1 TABLET (400 MG TOTAL) DAILY BY MOUTH., Disp: 90 tablet, Rfl: 1 .  aspirin EC 81 MG tablet, Take 81 mg by mouth daily., Disp: , Rfl:  .  Docusate Calcium (STOOL SOFTENER PO), Take 400 mg by mouth daily as needed. , Disp: , Rfl:  .  JARDIANCE 10 MG TABS tablet, Take 1 tablet by mouth once daily, Disp: 90 tablet, Rfl: 0 .  lisinopril-hydrochlorothiazide (ZESTORETIC) 10-12.5 MG tablet, TAKE 1 TABLET BY MOUTH EVERY DAY, Disp: 90 tablet, Rfl: 1 .  metFORMIN (GLUCOPHAGE-XR) 500 MG 24 hr tablet, Take 2 tablets (1,000 mg total) by mouth in the morning and at bedtime., Disp: 360 tablet, Rfl: 3 .  metoprolol tartrate (LOPRESSOR) 25 MG tablet, Take 1 tablet by mouth twice daily, Disp: 180 tablet, Rfl: 0 .  Multiple Vitamins-Minerals (ALIVE WOMENS 50+ PO), Take by mouth every other day. , Disp: , Rfl:  .  Omega-3 Fatty Acids (FISH OIL) 500 MG CAPS, Take 1 capsule by mouth daily. , Disp: , Rfl:  .  TURMERIC PO, Take 1 tablet by mouth., Disp: , Rfl:   EXAM:  GENERAL: alert, oriented,  appears well and in no acute distress  HEENT: atraumatic, conjunttiva clear, no obvious abnormalities on inspection of external nose and ears  NECK: normal movements of the head and neck  LUNGS: on inspection no signs of respiratory distress, breathing rate appears normal, no obvious gross SOB, gasping or wheezing  CV: no obvious cyanosis  PSYCH/NEURO: pleasant and cooperative, no obvious depression or anxiety, speech and thought processing grossly intact  ASSESSMENT AND PLAN:  Discussed the following assessment and plan:  Type 2 diabetes mellitus with hyperglycemia (HCC) Sugars as outlined.  Discussed low carb diet and exercise.  On metformin and  jardiance.  Follow met b and a1c.   SVT (supraventricular tachycardia) On metoprolol and doing well.  Stable.   Essential hypertension, benign Blood pressures have been doing well.  Continue lisinopril/hctz and metoprolol.  Follow pressures.  Follow metabolic panel.   Constipation Taking stool softener.  Discussed miralax.     No orders of the defined types were placed in this encounter.   No orders of the defined types were placed in this encounter.    I discussed the assessment and treatment plan with the patient. The patient was provided an opportunity to ask questions and all were answered. The patient agreed with the plan and demonstrated an understanding of the instructions.   The patient was advised to call back or seek an in-person evaluation if the symptoms worsen or if the condition fails to improve as anticipated.   Einar Pheasant, MD

## 2019-11-03 ENCOUNTER — Telehealth: Payer: Self-pay

## 2019-11-03 NOTE — Telephone Encounter (Signed)
    MA9/23/2021 3rd Attempt  Name: Amber Decker   MRN: [id number]   DOB: April 02, 1955   AGE: 64 y.o.   GENDER: female   PCP Dale [place], MD.   11/03/19 Spoke with patient emailed resources for assistance with rent and utilities to [email or web address] with patient's permission.  Will follow-up with patient over the next few days.     Jadan Rouillard, AAS Paralegal, [facility] Care Guide . Embedded Care Coordination [facility]  Care Management  [address] [place], [place] [address] [email or web address]  [phone]   [email or web address]

## 2019-11-06 ENCOUNTER — Encounter: Payer: Self-pay | Admitting: Internal Medicine

## 2019-11-06 DIAGNOSIS — K5900 Constipation, unspecified: Secondary | ICD-10-CM | POA: Insufficient documentation

## 2019-11-06 NOTE — Assessment & Plan Note (Signed)
Taking stool softener.  Discussed miralax.

## 2019-11-06 NOTE — Assessment & Plan Note (Signed)
Sugars as outlined.  Discussed low carb diet and exercise.  On metformin and jardiance.  Follow met b and a1c.

## 2019-11-06 NOTE — Assessment & Plan Note (Signed)
Blood pressures have been doing well.  Continue lisinopril/hctz and metoprolol.  Follow pressures.  Follow metabolic panel.

## 2019-11-06 NOTE — Assessment & Plan Note (Signed)
On metoprolol and doing well.  Stable.

## 2019-11-07 ENCOUNTER — Telehealth: Payer: Self-pay

## 2019-11-07 NOTE — Telephone Encounter (Signed)
    MA9/27/2021 1st Follow-up call Name: Amber Decker   MRN: [id number]   DOB: 10-06-1955   AGE: 64 y.o.   GENDER: female   PCP Dale [place], MD.   11/07/19 Unable to leave message on cell voicemail full and no answer at home phone number.  Will call again later this week regarding resources emailed for utility assistance on 11/03/19 .    [name], AAS Paralegal, [facility] Care Guide . Embedded Care Coordination [facility]  Care Management  [address] [place], [place] [address] [name]  [phone]   [email or web address]

## 2019-11-10 ENCOUNTER — Telehealth: Payer: Self-pay

## 2019-11-10 NOTE — Telephone Encounter (Signed)
    MA9/30/2021 2nd Attempt  Name: Amber Decker   MRN: [id number]   DOB: 1955/04/10   AGE: 63 y.o.   GENDER: female   PCP Dale [place], MD.   11/10/19 Unable to leave message on cell voicemail full and no answer at home phone number.  Will call again in a few days regarding resources emailed for utility assistance on 11/03/19 .    Emeric Novinger, AAS Paralegal, [facility] Care Guide . Embedded Care Coordination [facility]  Care Management  [address] [place], [place] [address] [email or web address]  [phone]   [email or web address]

## 2019-11-11 ENCOUNTER — Telehealth: Payer: Self-pay

## 2019-11-11 NOTE — Telephone Encounter (Signed)
    MA10/02/2019 3rd Attempt  Name: MARCELINA MCLAURIN   MRN: [id number]   DOB: 11-27-1955   AGE: 64 y.o.   GENDER: female   PCP Dale [place], MD.   11/11/19 Unable to leave message on cell voicemail full and no answer at home phone number.  Closing referral after 3 unsuccessful attempts to follow-up with patient regarding emailed resources for rent and utilities.    Jakai Onofre, AAS Paralegal, [facility] Care Guide . Embedded Care Coordination [facility]  Care Management  [address] [place], [place] [address] [email or web address]  [phone]   [email or web address]

## 2019-11-17 ENCOUNTER — Telehealth: Payer: Self-pay | Admitting: Internal Medicine

## 2019-11-17 NOTE — Telephone Encounter (Signed)
Rejection Reason - Patient was No Show - Pt did not show for her appt. Pt did not call to cancel or reschedule."  [facility] said on Nov 17, 2019 8:47 AM

## 2019-11-28 ENCOUNTER — Telehealth: Payer: 59

## 2020-01-10 ENCOUNTER — Ambulatory Visit: Payer: 59 | Admitting: Pharmacist

## 2020-01-10 DIAGNOSIS — E1165 Type 2 diabetes mellitus with hyperglycemia: Secondary | ICD-10-CM

## 2020-01-10 DIAGNOSIS — I10 Essential (primary) hypertension: Secondary | ICD-10-CM

## 2020-01-10 NOTE — Patient Instructions (Signed)
Visit Information  Patient Care Plan: Medication Management    Problem Identified: Diabetes     Long-Range Goal: Disease Progression Prevention   This Visit's Progress: On track  Priority: High  Note:   Current Barriers:  . Unable to independently afford treatment regimen . Unable to achieve control of diabetes   Pharmacist Clinical Goal(s):  [name] Over the next 90 days, patient will verbalize ability to afford treatment regimen. . Over the next 90 days, patient will achieve control of diabetes s evidenced by improvement in A1c through collaboration with PharmD and provider  Interventions: . Inter-disciplinary care team collaboration (see longitudinal plan of care) . Comprehensive medication review performed; medication list updated in electronic medical record  Diabetes: . Uncontrolled; current treatment: metformin XR 1000 mg BID, Jardiance 10 mg daily . Current glucose readings: fasting glucose: 110-140, post prandial glucose: not checking . Discussed medication accessibility for 2022. Will attempt to reapply for Jardiance assistance for 2022. If denied (d/t company reducing income requirement), will investigate coverage of Ste[employer] + savings card.  . Reviewed goal A1c, goal fasting and 2 hour post prandial. Will be due for lab work and PCP appt at the end of December. Collaborate w/ clinic staff to schedule patient for appointments   Hypertension: . Controlled; current treatment:  . Current home readings:  losartan/HCTZ 10/12.5 mg daily, metoprolol tartrate 25 mg BID . Recommended to continue current regime  Hyperlipidemia and ASCVD risk reduction: . Uncontrolled; current treatment: none; . Antiplatelet regimen: aspirin 81 mg daily . Recommended statin therapy previously, will continue to educate and reinforce recommendation  Patient Goals/Self-Care Activities . Over the next 90 days, patient will:  - take medications as prescribed check blood glucose BID, document, and  provide at future appointments collaborate with provider on medication access solutions  Follow Up Plan: Telephone follow up appointment with care management team member scheduled for: ~ 10 weeks (PCP appt in ~ 4)     The patient verbalized understanding of instructions, educational materials, and care plan provided today and declined offer to receive copy of patient instructions, educational materials, and care plan.  Plan: Telephone follow up appointment with care management team member scheduled for: ~ 8 weeks   [name] Feliz Beam, PharmD, [place], CPP Clinical Pharmacist [facility] [employer] [phone]

## 2020-01-10 NOTE — Chronic Care Management (AMB) (Signed)
Care Management   Pharmacy Note  01/10/2020 Name: Amber Decker MRN: [id number] DOB: 09/14/1955   Subjective:  Amber Decker is a 64 y.o. year old female who is a primary care patient of Dale [place], MD. The Care Management/Care Coordination team team was consulted for assistance with care management and care coordination needs.    Engaged with patient by telephone for follow up visit in response to provider referral for pharmacy case management and/or care coordination services.   Consent to Services:  Amber Decker was given information about care management/care coordination services, agreed to services, and gave verbal consent prior to initiation of services. Please see initial visit note for detailed documentation  Review of patient status, including review of consultants reports, laboratory and other test data, was performed as part of comprehensive evaluation and provision of chronic care management services.   SDOH (Social Determinants of Health) assessments and interventions performed:  SDOH Interventions     Most Recent Value  SDOH Interventions  Financial Strain Interventions Other (Comment)  [manufacturer assistance]       Objective:  Lab Results  Component Value Date   CREATININE 1.09 06/30/2019   CREATININE 0.92 04/28/2018   CREATININE 0.86 11/27/2017    Lab Results  Component Value Date   HGBA1C 7.4 (H) 06/30/2019       Component Value Date/Time   CHOL 186 06/30/2019 1548   TRIG 124.0 06/30/2019 1548   HDL 64.20 06/30/2019 1548   CHOLHDL 3 06/30/2019 1548   VLDL 24.8 06/30/2019 1548   LDLCALC 97 06/30/2019 1548     Clinical ASCVD: No  The 10-year ASCVD risk score Denman George DC Jr., et al., 2013) is: 13.5%   Values used to calculate the score:     Age: 39 years     Sex: Female     Is Non-Hispanic African American: Yes     Diabetic: Yes     Tobacco smoker: No     Systolic Blood Pressure: 118 mmHg     Is BP treated: Yes     HDL Cholesterol:  64.2 mg/dL     Total Cholesterol: 186 mg/dL      BP Readings from Last 3 Encounters:  06/30/19 118/72  02/23/18 110/70  12/28/17 100/69    Assessment:   Allergies  Allergen Reactions  . Erythromycin Hives    Medications Reviewed Today    Reviewed by Lourena Simmonds, RPH-CPP (Pharmacist) on 01/10/20 at 1542  Med List Status: <None>  Medication Order Taking? Sig Documenting Provider Last Dose Status Informant  acyclovir (ZOVIRAX) 400 MG tablet [id number] No TAKE 1 TABLET (400 MG TOTAL) DAILY BY MOUTH.  Patient not taking: Reported on 01/10/2020   Dale [place], MD Not Taking Active   aspirin EC 81 MG tablet [id number] Yes Take 81 mg by mouth daily. [provider] Taking Active   Docusate Calcium (STOOL SOFTENER PO) [id number] Yes Take 400 mg by mouth daily as needed.  [provider] Taking Active   JARDIANCE 10 MG TABS tablet [id number] Yes Take 1 tablet by mouth once daily Dale [place], MD Taking Active   lisinopril-hydrochlorothiazide (ZESTORETIC) 10-12.5 MG tablet [id number] Yes TAKE 1 TABLET BY MOUTH EVERY DAY Dale [place], MD Taking Active   metFORMIN (GLUCOPHAGE-XR) 500 MG 24 hr tablet [id number] Yes Take 2 tablets (1,000 mg total) by mouth in the morning and at bedtime. Dale [place], MD Taking Active   metoprolol tartrate (LOPRESSOR) 25 MG tablet [id number] Yes Take 1 tablet by mouth twice  daily Dale [place], MD Taking Active   Multiple Vitamins-Minerals (ALIVE WOMENS 50+ PO) [id number] Yes Take by mouth every other day.  [provider] Taking Active   Omega-3 Fatty Acids (FISH OIL) 500 MG CAPS [id number] Yes Take 1 capsule by mouth daily.  [provider] Taking Active   Probiotic Product (PROBIOTIC DAILY PO) [id number] Yes Take 1 capsule by mouth daily. [provider] Taking Active           Patient Active Problem List   Diagnosis Date Noted  . Constipation 11/06/2019  . Cracked skin on feet 07/03/2019  .  Abnormal mammogram 12/06/2017  . Colon cancer screening 12/06/2017  . Leukopenia 08/08/2017  . Mass of upper outer quadrant of left breast 01/15/2017  . BMI 40.0-44.9, adult (HCC) 07/24/2016  . 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 Health care maintenance 11/18/2014  . Shoulder pain 06/01/2013  . GERD (gastroesophageal reflux disease) 02/20/2013  . SVT (supraventricular tachycardia) (HCC) 03/25/2012  . Essential hypertension, benign 03/25/2012  . Type 2 diabetes mellitus with hyperglycemia (HCC) 03/25/2012  . Myasthenia gravis (HCC) 03/25/2012    Medication Assistance: Application for Jardiance medication assistance program in process. Anticipated assistance start date TBD. See plan of care below for additional detail.   Patient Care Plan: Medication Management    Problem Identified: Diabetes     Long-Range Goal: Disease Progression Prevention   This Visit's Progress: On track  Priority: High  Note:   Current Barriers:  . Unable to independently afford treatment regimen . Unable to achieve control of diabetes   Pharmacist Clinical Goal(s):  [name] Over the next 90 days, patient will verbalize ability to afford treatment regimen. . Over the next 90 days, patient will achieve control of diabetes s evidenced by improvement in A1c through collaboration with PharmD and provider  Interventions: . Inter-disciplinary care team collaboration (see longitudinal plan of care) . Comprehensive medication review performed; medication list updated in electronic medical record  Diabetes: . Uncontrolled; current treatment: metformin XR 1000 mg BID, Jardiance 10 mg daily . Current glucose readings: fasting glucose: 110-140, post prandial glucose: not checking . Discussed medication accessibility for 2022. Will attempt to reapply for Jardiance assistance for 2022. If denied (d/t company reducing income requirement), will investigate coverage of [employer] + savings card.  . Reviewed goal A1c, goal fasting and 2  hour post prandial. Will be due for lab work and PCP appt at the end of December. Collaborate w/ clinic staff to schedule patient for appointments   Hypertension: . Controlled; current treatment:  . Current home readings:  losartan/HCTZ 10/12.5 mg daily, metoprolol tartrate 25 mg BID . Recommended to continue current regime  Hyperlipidemia and ASCVD risk reduction: . Uncontrolled; current treatment: none; . Antiplatelet regimen: aspirin 81 mg daily . Recommended statin therapy previously, will continue to educate and reinforce recommendation  Patient Goals/Self-Care Activities . Over the next 90 days, patient will:  - take medications as prescribed check blood glucose BID, document, and provide at future appointments collaborate with provider on medication access solutions  Follow Up Plan: Telephone follow up appointment with care management team member scheduled for: ~ 10 weeks (PCP appt in ~ 4)      Plan: Telephone follow up appointment with care management team member scheduled for: ~ 8 weeks   [name] Feliz Beam, PharmD, [place], CPP Clinical Pharmacist [facility] [employer] [phone]

## 2020-01-11 NOTE — Progress Notes (Signed)
Called and scheduled pt

## 2020-01-18 ENCOUNTER — Other Ambulatory Visit: Payer: Self-pay | Admitting: Internal Medicine

## 2020-02-01 ENCOUNTER — Other Ambulatory Visit: Payer: Self-pay

## 2020-02-01 ENCOUNTER — Other Ambulatory Visit (INDEPENDENT_AMBULATORY_CARE_PROVIDER_SITE_OTHER): Payer: 59

## 2020-02-01 DIAGNOSIS — E1165 Type 2 diabetes mellitus with hyperglycemia: Secondary | ICD-10-CM

## 2020-02-01 DIAGNOSIS — I10 Essential (primary) hypertension: Secondary | ICD-10-CM

## 2020-02-01 LAB — BASIC METABOLIC PANEL
BUN: 19 mg/dL (ref 6–23)
CO2: 28 mEq/L (ref 19–32)
Calcium: 9.6 mg/dL (ref 8.4–10.5)
Chloride: 106 mEq/L (ref 96–112)
Creatinine, Ser: 0.98 mg/dL (ref 0.40–1.20)
GFR: 61.41 mL/min (ref >60.00)
Glucose, Bld: 128 mg/dL — ABNORMAL HIGH (ref 70–99)
Potassium: 4.1 mEq/L (ref 3.5–5.1)
Sodium: 140 mEq/L (ref 135–145)

## 2020-02-01 LAB — LIPID PANEL
Cholesterol: 193 mg/dL (ref 0–200)
HDL: 71.8 mg/dL
LDL Cholesterol: 104 mg/dL — ABNORMAL HIGH (ref 0–99)
NonHDL: 121.55
Total CHOL/HDL Ratio: 3
Triglycerides: 86 mg/dL (ref 0.0–149.0)
VLDL: 17.2 mg/dL (ref 0.0–40.0)

## 2020-02-01 LAB — CBC WITH DIFFERENTIAL/PLATELET
Basophils Absolute: 0 K/uL (ref 0.0–0.1)
Basophils Relative: 1.2 % (ref 0.0–3.0)
Eosinophils Absolute: 0.1 K/uL (ref 0.0–0.7)
Eosinophils Relative: 3.2 % (ref 0.0–5.0)
HCT: 42.8 % (ref 36.0–46.0)
Hemoglobin: 14.2 g/dL (ref 12.0–15.0)
Lymphocytes Relative: 50.9 % — ABNORMAL HIGH (ref 12.0–46.0)
Lymphs Abs: 1.6 K/uL (ref 0.7–4.0)
MCHC: 33.2 g/dL (ref 30.0–36.0)
MCV: 91.5 fl (ref 78.0–100.0)
Monocytes Absolute: 0.3 K/uL (ref 0.1–1.0)
Monocytes Relative: 11.1 % (ref 3.0–12.0)
Neutro Abs: 1 K/uL — ABNORMAL LOW (ref 1.4–7.7)
Neutrophils Relative %: 33.6 % — ABNORMAL LOW (ref 43.0–77.0)
Platelets: 172 K/uL (ref 150.0–400.0)
RBC: 4.67 Mil/uL (ref 3.87–5.11)
RDW: 13.8 % (ref 11.5–15.5)
WBC: 3 K/uL — ABNORMAL LOW (ref 4.0–10.5)

## 2020-02-01 LAB — HEPATIC FUNCTION PANEL
ALT: 12 U/L (ref 0–35)
AST: 14 U/L (ref 0–37)
Albumin: 4.3 g/dL (ref 3.5–5.2)
Alkaline Phosphatase: 43 U/L (ref 39–117)
Bilirubin, Direct: 0.1 mg/dL (ref 0.0–0.3)
Total Bilirubin: 0.5 mg/dL (ref 0.2–1.2)
Total Protein: 7.1 g/dL (ref 6.0–8.3)

## 2020-02-01 LAB — HEMOGLOBIN A1C: Hgb A1c MFr Bld: 7.1 % — ABNORMAL HIGH (ref 4.6–6.5)

## 2020-02-03 ENCOUNTER — Other Ambulatory Visit: Payer: Self-pay | Admitting: Internal Medicine

## 2020-02-06 ENCOUNTER — Ambulatory Visit: Payer: 59 | Admitting: Pharmacist

## 2020-02-06 DIAGNOSIS — E1165 Type 2 diabetes mellitus with hyperglycemia: Secondary | ICD-10-CM

## 2020-02-06 DIAGNOSIS — I10 Essential (primary) hypertension: Secondary | ICD-10-CM

## 2020-02-06 NOTE — Patient Instructions (Signed)
Visit Information  Patient Care Plan: Medication Management    Problem Identified: Diabetes     Long-Range Goal: Disease Progression Prevention   Recent Progress: On track  Priority: High  Note:   Current Barriers:  . Unable to independently afford treatment regimen . Unable to achieve control of diabetes   Pharmacist Clinical Goal(s):  [name] Over the next 90 days, patient will verbalize ability to afford treatment regimen. . Over the next 90 days, patient will achieve control of diabetes s evidenced by improvement in A1c through collaboration with PharmD and provider  Interventions: . Inter-disciplinary care team collaboration (see longitudinal plan of care) . Comprehensive medication review performed; medication list updated in electronic medical record  Diabetes: . Uncontrolled; current treatment: metformin XR 1000 mg BID, Jardiance 10 mg daily . Current glucose readings: fasting glucose: 110-140, post prandial glucose: not checking . Received notice of additional information being needed from [facility]. Called [employer]. They note they did not receive page 1 of the application, even though I faxed this in with the rest of the application. Resubmitted application. Will collaborate w/ CPhT for f/u  Hypertension: . Controlled; current treatment:  . Current home readings:  losartan/HCTZ 10/12.5 mg daily, metoprolol tartrate 25 mg BID . Recommended to continue current regime  Hyperlipidemia and ASCVD risk reduction: . Uncontrolled; current treatment: none; . Antiplatelet regimen: aspirin 81 mg daily . Recommended statin therapy previously, will continue to educate and reinforce recommendation  Patient Goals/Self-Care Activities . Over the next 90 days, patient will:  - take medications as prescribed check blood glucose BID, document, and provide at future appointments collaborate with provider on medication access solutions  Follow Up Plan: Telephone follow up appointment with  care management team member scheduled for: ~ 4 weeks (PCP appt in ~ 1 week)      The patient verbalized understanding of instructions, educational materials, and care plan provided today and declined offer to receive copy of patient instructions, educational materials, and care plan.   Plan: Telephone follow up appointment with care management team member scheduled for:~ 4 weeks (PCP in ~ 1 week)  [name] Feliz Beam, PharmD, [place], CPP Clinical Pharmacist [facility] [employer] [phone]

## 2020-02-06 NOTE — Chronic Care Management (AMB) (Signed)
Care Management   Pharmacy Note  02/06/2020 Name: Amber Decker MRN: [id number] DOB: 08/08/1955  Amber Decker is a 64 y.o. year old female who is a primary care patient of Amber Kenton, MD. The Care Management/Care Coordination team team was consulted for assistance with care management and care coordination needs.    Care coordination completed today for follow up on medication access in response to provider referral for pharmacy case management and/or care coordination services.   Consent to Services:  Amber Decker was given information about care management/care coordination services, agreed to services, and gave verbal consent prior to initiation of services. Please see initial visit note for detailed documentation.   Review of patient status, including review of consultants reports, laboratory and other test data, was performed as part of comprehensive evaluation and provision of chronic care management services.   SDOH (Social Determinants of Health) assessments and interventions performed:  SDOH Interventions   Flowsheet Row Most Recent Value  SDOH Interventions   Financial Strain Interventions Other (Comment)  [manufacturer assistance]       Objective:  Lab Results  Component Value Date   CREATININE 0.98 02/01/2020   CREATININE 1.09 06/30/2019   CREATININE 0.92 04/28/2018    Lab Results  Component Value Date   HGBA1C 7.1 (H) 02/01/2020       Component Value Date/Time   CHOL 193 02/01/2020 0906   TRIG 86.0 02/01/2020 0906   HDL 71.80 02/01/2020 0906   CHOLHDL 3 02/01/2020 0906   VLDL 17.2 02/01/2020 0906   LDLCALC 104 (H) 02/01/2020 0906    Clinical ASCVD: No  The 10-year ASCVD risk score Denman George DC Jr., et al., 2013) is: 13.4%   Values used to calculate the score:     Age: 69 years     Sex: Female     Is Non-Hispanic African American: Yes     Diabetic: Yes     Tobacco smoker: No     Systolic Blood Pressure: 118 mmHg     Is BP treated: Yes     HDL  Cholesterol: 71.8 mg/dL     Total Cholesterol: 193 mg/dL     BP Readings from Last 3 Encounters:  06/30/19 118/72  02/23/18 110/70  12/28/17 100/69    Care Plan  Allergies  Allergen Reactions  . Erythromycin Hives    Medications Reviewed Today    Reviewed by Amber Decker, RPH-CPP (Pharmacist) on 01/10/20 at 1542  Med List Status: <None>  Medication Order Taking? Sig Documenting Provider Last Dose Status Informant  acyclovir (ZOVIRAX) 400 MG tablet [id number] No TAKE 1 TABLET (400 MG TOTAL) DAILY BY MOUTH.  Patient not taking: Reported on 01/10/2020   Amber [place], MD Not Taking Active   aspirin EC 81 MG tablet [id number] Yes Take 81 mg by mouth daily. [provider] Taking Active   Docusate Calcium (STOOL SOFTENER PO) [id number] Yes Take 400 mg by mouth daily as needed.  [provider] Taking Active   JARDIANCE 10 MG TABS tablet [id number] Yes Take 1 tablet by mouth once daily Amber [place], MD Taking Active   lisinopril-hydrochlorothiazide (ZESTORETIC) 10-12.5 MG tablet [id number] Yes TAKE 1 TABLET BY MOUTH EVERY DAY Amber [place], MD Taking Active   metFORMIN (GLUCOPHAGE-XR) 500 MG 24 hr tablet [id number] Yes Take 2 tablets (1,000 mg total) by mouth in the morning and at bedtime. Amber [place], MD Taking Active   metoprolol tartrate (LOPRESSOR) 25 MG tablet [id number] Yes Take 1 tablet by mouth twice daily  Amber [place], MD Taking Active   Multiple Vitamins-Minerals (ALIVE WOMENS 50+ PO) [id number] Yes Take by mouth every other day.  [provider] Taking Active   Omega-3 Fatty Acids (FISH OIL) 500 MG CAPS [id number] Yes Take 1 capsule by mouth daily.  [provider] Taking Active   Probiotic Product (PROBIOTIC DAILY PO) [id number] Yes Take 1 capsule by mouth daily. [provider] Taking Active           Patient Active Problem List   Diagnosis Date Noted  . Constipation 11/06/2019  . Cracked skin on feet 07/03/2019  .  Abnormal mammogram 12/06/2017  . Colon cancer screening 12/06/2017  . Leukopenia 08/08/2017  . Mass of upper outer quadrant of left breast 01/15/2017  . BMI 40.0-44.9, adult (HCC) 07/24/2016  . 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 Health care maintenance 11/18/2014  . Shoulder pain 06/01/2013  . GERD (gastroesophageal reflux disease) 02/20/2013  . SVT (supraventricular tachycardia) (HCC) 03/25/2012  . Essential hypertension, benign 03/25/2012  . Type 2 diabetes mellitus with hyperglycemia (HCC) 03/25/2012  . Myasthenia gravis (HCC) 03/25/2012    Conditions to be addressed/monitored per PCP order: HTN, HLD and DMII  Patient Care Plan: Medication Management    Problem Identified: Diabetes     Long-Range Goal: Disease Progression Prevention   Recent Progress: On track  Priority: High  Note:   Current Barriers:  . Unable to independently afford treatment regimen . Unable to achieve control of diabetes   Pharmacist Clinical Goal(s):  [name] Over the next 90 days, patient will verbalize ability to afford treatment regimen. . Over the next 90 days, patient will achieve control of diabetes s evidenced by improvement in A1c through collaboration with PharmD and provider  Interventions: . Inter-disciplinary care team collaboration (see longitudinal plan of care) . Comprehensive medication review performed; medication list updated in electronic medical record  Diabetes: . Uncontrolled; current treatment: metformin XR 1000 mg BID, Jardiance 10 mg daily . Current glucose readings: fasting glucose: 110-140, post prandial glucose: not checking . Received notice of additional information being needed from [facility]. Called [employer]. They note they did not receive page 1 of the application, even though I faxed this in with the rest of the application. Resubmitted application. Will collaborate w/ CPhT for f/u  Hypertension: . Controlled; current treatment:  . Current home readings:  losartan/HCTZ 10/12.5  mg daily, metoprolol tartrate 25 mg BID . Recommended to continue current regime  Hyperlipidemia and ASCVD risk reduction: . Uncontrolled; current treatment: none; . Antiplatelet regimen: aspirin 81 mg daily . Recommended statin therapy previously, will continue to educate and reinforce recommendation  Patient Goals/Self-Care Activities . Over the next 90 days, patient will:  - take medications as prescribed check blood glucose BID, document, and provide at future appointments collaborate with provider on medication access solutions  Follow Up Plan: Telephone follow up appointment with care management team member scheduled for: ~ 4 weeks (PCP appt in ~ 1 week)     Medication Assistance: Application for BI (Jardiance) medication assistance program in process. Anticipated assistance start date TBD. See plan of care above for additional detail.   Plan: Telephone follow up appointment with care management team member scheduled for:~ 4 weeks (PCP in ~ 1 week)  [name] Feliz Beam, PharmD, [place], CPP Clinical Pharmacist [facility] [employer] [phone]

## 2020-02-12 ENCOUNTER — Other Ambulatory Visit: Payer: Self-pay | Admitting: Internal Medicine

## 2020-02-12 DIAGNOSIS — E1165 Type 2 diabetes mellitus with hyperglycemia: Secondary | ICD-10-CM

## 2020-02-14 ENCOUNTER — Ambulatory Visit: Payer: 59 | Admitting: Internal Medicine

## 2020-02-16 ENCOUNTER — Telehealth: Payer: Self-pay | Admitting: Pharmacist

## 2020-02-16 NOTE — Telephone Encounter (Signed)
  Chronic Care Management   Note  02/16/2020 Name: Amber Decker MRN: [id number] DOB: Feb 15, 1955   Attempted to contact patient for review that [employer] patient assistance denied her 2022 reapplication as we need updated proof of income. Attempted to leave voicemail, but her mailbox was fill

## 2020-02-17 NOTE — Telephone Encounter (Signed)
Spoke with patient. She will bring updated proof of income information with her to clinic next Monday when she comes for PCP appt

## 2020-02-20 ENCOUNTER — Ambulatory Visit (INDEPENDENT_AMBULATORY_CARE_PROVIDER_SITE_OTHER): Payer: 59 | Admitting: Internal Medicine

## 2020-02-20 ENCOUNTER — Encounter: Payer: Self-pay | Admitting: Internal Medicine

## 2020-02-20 ENCOUNTER — Other Ambulatory Visit: Payer: Self-pay

## 2020-02-20 DIAGNOSIS — G7000 Myasthenia gravis without (acute) exacerbation: Secondary | ICD-10-CM

## 2020-02-20 DIAGNOSIS — E1165 Type 2 diabetes mellitus with hyperglycemia: Secondary | ICD-10-CM

## 2020-02-20 DIAGNOSIS — E7800 Pure hypercholesterolemia, unspecified: Secondary | ICD-10-CM

## 2020-02-20 DIAGNOSIS — I10 Essential (primary) hypertension: Secondary | ICD-10-CM | POA: Diagnosis not present

## 2020-02-20 DIAGNOSIS — I471 Supraventricular tachycardia: Secondary | ICD-10-CM

## 2020-02-20 DIAGNOSIS — Z6841 Body Mass Index (BMI) 40.0 and over, adult: Secondary | ICD-10-CM

## 2020-02-20 DIAGNOSIS — D72819 Decreased white blood cell count, unspecified: Secondary | ICD-10-CM

## 2020-02-20 LAB — HM DIABETES FOOT EXAM

## 2020-02-20 NOTE — Progress Notes (Signed)
Patient ID: Amber Decker, female   DOB: [birth year], 65 y.o.   MRN: [id number]   Subjective:    Patient ID: Amber Decker, female    DOB: 1955/07/22, 65 y.o.   MRN: [id number]  HPI This visit occurred during the SARS-CoV-2 public health emergency.  Safety protocols were in place, including screening questions prior to the visit, additional usage of staff PPE, and extensive cleaning of exam room while observing appropriate contact time as indicated for disinfecting solutions.  Patient here for a scheduled follow up.  Here to follow up regarding her blood sugar, blood pressure and cholesterol.  She reports she is doing relatively well.  Trying to stay active.  Discussed low carb diet and exercise.  Discussed labs. a1c 7.1.  Improving.  Taking jardiance and metformin.  Discussed adjusting medication and increasing jardiance.  She reports vaginal itching.  Used monistat -7.  Helped.  Desires not to increase at this time.  Was questioning if jardiance contributed to yeast infection.  Since sugar improving, wants to continue diet and exercise.  No chest pain or sob reported.  No abdominal pain.  Bowels moving.  Increased stress - family medical issues.  Overall she feels she is handling things well.    Past Medical History:  Diagnosis Date  . Diabetes mellitus without complication (HCC) 2008  . Herpes   . Hypertension   . Myasthenia gravis [facility])    onset age 84's?  . SVT (supraventricular tachycardia) (HCC)    Past Surgical History:  Procedure Laterality Date  . MYOMECTOMY    . UTERINE FIBROID SURGERY     at [facility] History  Problem Relation Age of Onset  . Diabetes Mother   . Alcohol abuse Father   . Heart disease Father        myocardial infarction  . Breast cancer Neg Hx   . Colon cancer Neg Hx    Social History   Socioeconomic History  . Marital status: Divorced    Spouse name: Not on file  . Number of children: 0  . Years of education: Not on file  . Highest education  level: Not on file  Occupational History    Employer: OTHER  Tobacco Use  . Smoking status: Never Smoker  . Smokeless tobacco: Never Used  Substance and Sexual Activity  . Alcohol use: Yes    Alcohol/week: 0.0 standard drinks    Comment: occasionally  . Drug use: No  . Sexual activity: Not on file  Other Topics Concern  . Not on file  Social History Narrative  . Not on file   Social Determinants of Health   Financial Resource Strain: Medium Risk  . Difficulty of Paying Living Expenses: Somewhat hard  Food Insecurity: Not on file  Transportation Needs: Not on file  Physical Activity: Not on file  Stress: Not on file  Social Connections: Not on file    Outpatient Encounter Medications as of 02/20/2020  Medication Sig  . acyclovir (ZOVIRAX) 400 MG tablet TAKE 1 TABLET (400 MG TOTAL) DAILY BY MOUTH.  [name] aspirin EC 81 MG tablet Take 81 mg by mouth daily.  Tery Sanfilippo Calcium (STOOL SOFTENER PO) Take 400 mg by mouth daily as needed.   [name] JARDIANCE 10 MG TABS tablet Take 1 tablet by mouth once daily  . lisinopril-hydrochlorothiazide (ZESTORETIC) 10-12.5 MG tablet TAKE 1 TABLET BY MOUTH EVERY DAY  . metFORMIN (GLUCOPHAGE-XR) 500 MG 24 hr tablet TAKE 2 TABLETS BY MOUTH IN THE  MORNING AND 2 TABLETS AT BEDTIME  . metoprolol tartrate (LOPRESSOR) 25 MG tablet Take 1 tablet by mouth twice daily  . Multiple Vitamins-Minerals (ALIVE WOMENS 50+ PO) Take by mouth every other day.   . Omega-3 Fatty Acids (FISH OIL) 500 MG CAPS Take 1 capsule by mouth daily.   . Probiotic Product (PROBIOTIC DAILY PO) Take 1 capsule by mouth daily.   No facility-administered encounter medications on file as of 02/20/2020.    Review of Systems  Constitutional: Negative for appetite change and unexpected weight change.  HENT: Negative for congestion and sinus pressure.   Respiratory: Negative for cough, chest tightness and shortness of breath.   Cardiovascular: Negative for chest pain, palpitations and leg  swelling.  Gastrointestinal: Negative for abdominal pain, diarrhea, nausea and vomiting.  Genitourinary: Negative for difficulty urinating and dysuria.  Musculoskeletal: Negative for joint swelling and myalgias.  Skin: Negative for color change and rash.  Neurological: Negative for dizziness, light-headedness and headaches.  Psychiatric/Behavioral: Negative for agitation and dysphoric mood.       Objective:    Physical Exam  BP 120/68   Pulse 73   Temp 98.3 F (36.8 C) (Oral)   Resp 16   Ht 5\' 9"  (1.753 m)   Wt 272 lb (123.4 kg)   LMP 02/21/2012   SpO2 98%   BMI 40.17 kg/m  Wt Readings from Last 3 Encounters:  02/20/20 272 lb (123.4 kg)  06/30/19 275 lb 3.2 oz (124.8 kg)  02/23/18 270 lb 9.6 oz (122.7 kg)     Lab Results  Component Value Date   WBC 3.0 (L) 02/01/2020   HGB 14.2 02/01/2020   HCT 42.8 02/01/2020   PLT 172.0 02/01/2020   GLUCOSE 128 (H) 02/01/2020   CHOL 193 02/01/2020   TRIG 86.0 02/01/2020   HDL 71.80 02/01/2020   LDLCALC 104 (H) 02/01/2020   ALT 12 02/01/2020   AST 14 02/01/2020   NA 140 02/01/2020   K 4.1 02/01/2020   CL 106 02/01/2020   CREATININE 0.98 02/01/2020   BUN 19 02/01/2020   CO2 28 02/01/2020   TSH 1.32 06/30/2019   HGBA1C 7.1 (H) 02/01/2020   MICROALBUR <0.7 06/30/2019    MM 3D SCREEN BREAST BILATERAL  Result Date: 07/07/2019 CLINICAL DATA:  Screening. EXAM: DIGITAL SCREENING BILATERAL MAMMOGRAM WITH TOMO AND CAD COMPARISON:  Previous exam(s). ACR Breast Density Category b: There are scattered areas of fibroglandular density. FINDINGS: In the right breast possible calcifications requires further evaluation. In the left breast possible mass requires further evaluation. Images were processed with CAD. IMPRESSION: Further evaluation is suggested for possible calcifications in the right breast. Further evaluation is suggested for possible mass in the left breast. RECOMMENDATION: Diagnostic mammogram and possibly ultrasound of both  breasts. (Code:[id number]) The patient will be contacted regarding the findings, and additional imaging will be scheduled. BI-RADS CATEGORY  0: Incomplete. Need additional imaging evaluation and/or prior mammograms for comparison. Electronically Signed   By: 07/09/2019 M.D.   On: 07/07/2019 16:50       Assessment & Plan:   Problem List Items Addressed This Visit    BMI 40.0-44.9, adult (HCC)    Discussed diet and exercise.  Follow.       Essential hypertension, benign    Continues on lisinopril/hctz and metoprolol.  Blood pressures doing well.  No change in medication.  Follow pressures.  Follow metabolic panel.       Hypercholesteremia    Discussed diet and exercise.  Given  history of diabetes, it is recommended for her to be on a cholesterol medication.  She wants to hold at this time.  Follow lipid panel.       Leukopenia    White blood cell count 3.0.  Has been slightly decreased.  Will follow cbc.  Recheck soon to confirm stable.       Relevant Orders   CBC with Differential/Platelet   Myasthenia gravis (HCC)    Stable.       SVT (supraventricular tachycardia) (HCC)    On metoprolol and doing well.  Follow.       Type 2 diabetes mellitus with hyperglycemia (HCC)    On metformin and jardiance.  a1c 7.1.  Discussed increasing dose of jardiance or adjusting medication.  Since improved, she wants to continue to work on diet and exercise before adding medication.  Concern - vaginal infection.  Will follow.  Continue jardiance 10mg  q day.  Follow sugars.            , MD

## 2020-02-26 ENCOUNTER — Encounter: Payer: Self-pay | Admitting: Internal Medicine

## 2020-02-26 DIAGNOSIS — E7800 Pure hypercholesterolemia, unspecified: Secondary | ICD-10-CM | POA: Insufficient documentation

## 2020-02-26 NOTE — Assessment & Plan Note (Signed)
Discussed diet and exercise.  Follow.  

## 2020-02-26 NOTE — Assessment & Plan Note (Signed)
Continues on lisinopril/hctz and metoprolol.  Blood pressures doing well.  No change in medication.  Follow pressures.  Follow metabolic panel.

## 2020-02-26 NOTE — Assessment & Plan Note (Signed)
On metformin and jardiance.  a1c 7.1.  Discussed increasing dose of jardiance or adjusting medication.  Since improved, she wants to continue to work on diet and exercise before adding medication.  Concern - vaginal infection.  Will follow.  Continue jardiance 10mg  q day.  Follow sugars.

## 2020-02-26 NOTE — Assessment & Plan Note (Signed)
On metoprolol and doing well.  Follow.

## 2020-02-26 NOTE — Assessment & Plan Note (Signed)
Discussed diet and exercise.  Given history of diabetes, it is recommended for her to be on a cholesterol medication.  She wants to hold at this time.  Follow lipid panel.

## 2020-02-26 NOTE — Assessment & Plan Note (Signed)
White blood cell count 3.0.  Has been slightly decreased.  Will follow cbc.  Recheck soon to confirm stable.

## 2020-02-26 NOTE — Assessment & Plan Note (Signed)
Stable

## 2020-03-07 ENCOUNTER — Ambulatory Visit: Payer: 59 | Admitting: Pharmacist

## 2020-03-07 NOTE — Patient Instructions (Signed)
Visit Information  Goals Addressed              This Visit's Progress     Patient Stated   .  Medication Monitoring (pt-stated)        Patient Goals/Self-Care Activities . Over the next 90 days, patient will:  - take medications as prescribed check blood glucose BID, document, and provide at future appointments collaborate with provider on medication access solutions       The patient verbalized understanding of instructions, educational materials, and care plan provided today and declined offer to receive copy of patient instructions, educational materials, and care plan.   Plan: Telephone follow up appointment with care management team member scheduled for:  ~ 8 weeks   [name] Feliz Beam, PharmD, [place], CPP Clinical Pharmacist [facility] [employer] [phone]

## 2020-03-07 NOTE — Chronic Care Management (AMB) (Signed)
Care Management   Pharmacy Note  03/07/2020 Name: Amber Decker MRN: [id number] DOB: 01/16/1956  Subjective: Amber Decker is a 65 y.o. year old female who is a primary care patient of Dale [place], MD. The Care Management team was consulted for assistance with care management and care coordination needs.    Engaged with patient by telephone for follow up visit in response to provider referral for pharmacy case management and/or care coordination services.   The patient was given information about Care Management services today including:  1. Care Management services includes personalized support from designated clinical staff supervised by the patient's primary care provider, including individualized plan of care and coordination with other care providers. 2. 24/7 contact phone numbers for assistance for urgent and routine care needs. 3. The patient may stop case management services at any time by phone call to the office staff.  Patient agreed to services and consent obtained.  Assessment:  Review of patient status, including review of consultants reports, laboratory and other test data, was performed as part of comprehensive evaluation and provision of chronic care management services.   SDOH (Social Determinants of Health) assessments and interventions performed:  SDOH Interventions   Flowsheet Row Most Recent Value  SDOH Interventions   SDOH Interventions for the Following Domains Physical Activity  Financial Strain Interventions Other (Comment)  [manufacturer assistance]  Physical Activity Interventions Other (Comments)  [encouraged goal of 3 days/week]       Objective:  Lab Results  Component Value Date   CREATININE 0.98 02/01/2020   CREATININE 1.09 06/30/2019   CREATININE 0.92 04/28/2018    Lab Results  Component Value Date   HGBA1C 7.1 (H) 02/01/2020       Component Value Date/Time   CHOL 193 02/01/2020 0906   TRIG 86.0 02/01/2020 0906   HDL 71.80  02/01/2020 0906   CHOLHDL 3 02/01/2020 0906   VLDL 17.2 02/01/2020 0906   LDLCALC 104 (H) 02/01/2020 0906    Clinical ASCVD: No  The 10-year ASCVD risk score Denman George DC Jr., et al., 2013) is: 14%   Values used to calculate the score:     Age: 78 years     Sex: Female     Is Non-Hispanic African American: Yes     Diabetic: Yes     Tobacco smoker: No     Systolic Blood Pressure: 120 mmHg     Is BP treated: Yes     HDL Cholesterol: 71.8 mg/dL     Total Cholesterol: 193 mg/dL    BP Readings from Last 3 Encounters:  02/20/20 120/68  06/30/19 118/72  02/23/18 110/70    Care Plan  Allergies  Allergen Reactions  . Erythromycin Hives    Medications Reviewed Today    Reviewed by Lourena Simmonds, RPH-CPP (Pharmacist) on 03/07/20 at 1359  Med List Status: <None>  Medication Order Taking? Sig Documenting Provider Last Dose Status Informant  acyclovir (ZOVIRAX) 400 MG tablet [id number] Yes TAKE 1 TABLET (400 MG TOTAL) DAILY BY MOUTH. Dale [place], MD Taking Active   aspirin EC 81 MG tablet [id number] Yes Take 81 mg by mouth daily. [provider] Taking Active   Docusate Calcium (STOOL SOFTENER PO) [id number] Yes Take 400 mg by mouth daily as needed.  [provider] Taking Active   JARDIANCE 10 MG TABS tablet [id number] Yes Take 1 tablet by mouth once daily Dale [place], MD Taking Active   lisinopril-hydrochlorothiazide (ZESTORETIC) 10-12.5 MG tablet [id number] Yes TAKE 1 TABLET BY  MOUTH EVERY DAY Dale [place], MD Taking Active   metFORMIN (GLUCOPHAGE-XR) 500 MG 24 hr tablet [id number] Yes TAKE 2 TABLETS BY MOUTH IN THE MORNING AND 2 TABLETS AT BEDTIME Dale [place], MD Taking Active   metoprolol tartrate (LOPRESSOR) 25 MG tablet [id number] Yes Take 1 tablet by mouth twice daily Dale [place], MD Taking Active   Multiple Vitamins-Minerals (ALIVE WOMENS 50+ PO) [id number] Yes Take by mouth every other day.  [provider] Taking Active   Omega-3 Fatty  Acids (FISH OIL) 500 MG CAPS [id number] Yes Take 1 capsule by mouth daily.  [provider] Taking Active   Probiotic Product (PROBIOTIC DAILY PO) [id number] Yes Take 1 capsule by mouth daily. [provider] Taking Active           Patient Active Problem List   Diagnosis Date Noted  . Hypercholesteremia 02/26/2020  . Constipation 11/06/2019  . Cracked skin on feet 07/03/2019  . Abnormal mammogram 12/06/2017  . Colon cancer screening 12/06/2017  . Leukopenia 08/08/2017  . Mass of upper outer quadrant of left breast 01/15/2017  . BMI 40.0-44.9, adult (HCC) 07/24/2016  . 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 Health care maintenance 11/18/2014  . Shoulder pain 06/01/2013  . GERD (gastroesophageal reflux disease) 02/20/2013  . SVT (supraventricular tachycardia) (HCC) 03/25/2012  . Essential hypertension, benign 03/25/2012  . Type 2 diabetes mellitus with hyperglycemia (HCC) 03/25/2012  . Myasthenia gravis (HCC) 03/25/2012    Conditions to be addressed/monitored: HTN, HLD and DMII  Care Plan : Medication Management  Updates made by Lourena Simmonds, RPH-CPP since 03/07/2020 12:00 AM    Problem: Diabetes     Long-Range Goal: Disease Progression Prevention   This Visit's Progress: On track  Recent Progress: On track  Priority: High  Note:   Current Barriers:  . Unable to independently afford treatment regimen . Unable to achieve control of diabetes   Pharmacist Clinical Goal(s):  [name] Over the next 90 days, patient will verbalize ability to afford treatment regimen. . Over the next 90 days, patient will achieve control of diabetes s evidenced by improvement in A1c through collaboration with PharmD and provider  Interventions: . 1:1 collaboration with Dale [place], MD regarding development and update of comprehensive plan of care as evidenced by provider attestation and co-signature . Inter-disciplinary care team collaboration (see longitudinal plan of  care) . Comprehensive medication review performed; medication list updated in electronic medical record  Diabetes: . Uncontrolled but improved; current treatment: metformin XR 1000 mg BID, Jardiance 10 mg daily . Current glucose readings: fasting glucose: 110-130, post prandial glucose: not checking . Current meal patterns: Breakfast: eggs, sausage on small roll, sometimes oatmeal; sometimes Glucerna protein shake; Lunch: Drinks: juice infrequently, G2 gatorade . Current physical activity: none right now. Is aware that she needs to start regular physical activity. Wants to set an attainable goal of starting exercise 3 days per week.  . Working on [facility] reapplication. Need updated proof of income. Patient notes she keeps forgetting to bring this by the office, but she will do so today. I will pass along to CPhT for submission and follow up. . Recommend to continue current regimen at this time. Discussed incorporation of physical activity 3 days a week.    Hypertension: . Controlled; current treatment: losartan/HCTZ 10/12.5 mg daily, metoprolol tartrate 25 mg BID . Current home readings: not checking at this time  . Recommended to continue current regimen . Discussed beneficial impact of exercise on blood pressure.  Hyperlipidemia and ASCVD risk reduction: . Uncontrolled; current treatment: none; has been advised of statin therapy but patient has declined  . Antiplatelet regimen: aspirin 81 mg daily . Educated on goal LDL <70 given DM + HTN, as well as long term ASCVD risk reduction.  Patient admits that adding cost of another medication is something that worries her. Discussed that statin therapy is generally very affordable. She will continue to contemplate.   Patient Goals/Self-Care Activities . Over the next 90 days, patient will:  - take medications as prescribed check blood glucose BID, document, and provide at future appointments collaborate with provider on medication access  solutions  Follow Up Plan: Telephone follow up appointment with care management team member scheduled for: ~ 8 weeks      Medication Assistance:  Application for Jardiance  medication assistance program. in process.  Anticipated assistance start date TBD.  See plan of care for additional detail.  Follow Up:  Patient agrees to Care Plan and Follow-up.  Plan: Telephone follow up appointment with care management team member scheduled for:  ~ 8 weeks   [name] Feliz Beam, PharmD, [place], CPP Clinical Pharmacist [facility] [phone]

## 2020-03-19 ENCOUNTER — Other Ambulatory Visit (INDEPENDENT_AMBULATORY_CARE_PROVIDER_SITE_OTHER): Payer: 59

## 2020-03-19 ENCOUNTER — Other Ambulatory Visit: Payer: Self-pay

## 2020-03-19 DIAGNOSIS — D72819 Decreased white blood cell count, unspecified: Secondary | ICD-10-CM | POA: Diagnosis not present

## 2020-03-20 LAB — CBC WITH DIFFERENTIAL/PLATELET
Basophils Absolute: 0 10*3/uL (ref 0.0–0.1)
Basophils Relative: 0.9 % (ref 0.0–3.0)
Eosinophils Absolute: 0.1 10*3/uL (ref 0.0–0.7)
Eosinophils Relative: 3 % (ref 0.0–5.0)
HCT: 43.6 % (ref 36.0–46.0)
Hemoglobin: 14.7 g/dL (ref 12.0–15.0)
Lymphocytes Relative: 52.7 % — ABNORMAL HIGH (ref 12.0–46.0)
Lymphs Abs: 2.4 10*3/uL (ref 0.7–4.0)
MCHC: 33.8 g/dL (ref 30.0–36.0)
MCV: 90.1 fl (ref 78.0–100.0)
Monocytes Absolute: 0.4 10*3/uL (ref 0.1–1.0)
Monocytes Relative: 9.2 % (ref 3.0–12.0)
Neutro Abs: 1.6 10*3/uL (ref 1.4–7.7)
Neutrophils Relative %: 34.2 % — ABNORMAL LOW (ref 43.0–77.0)
Platelets: 198 10*3/uL (ref 150.0–400.0)
RBC: 4.84 Mil/uL (ref 3.87–5.11)
RDW: 13.8 % (ref 11.5–15.5)
WBC: 4.6 10*3/uL (ref 4.0–10.5)

## 2020-03-21 ENCOUNTER — Telehealth: Payer: Self-pay | Admitting: Pharmacist

## 2020-03-21 NOTE — Telephone Encounter (Signed)
  Chronic Care Management   Note  03/21/2020 Name: Amber Decker MRN: [id number] DOB: [birth year]   Received note from CPhT that [employer] is going to require the actual federal income tax return, patient previously brought [place] the summary of her state tax returned. Called patient, notified of this. She will bring copy of federal tax return at her earliest convenience.   [name] Feliz Beam, PharmD, [place], CPP Clinical Pharmacist [facility] [phone]

## 2020-03-22 ENCOUNTER — Telehealth: Payer: Self-pay | Admitting: Internal Medicine

## 2020-03-22 ENCOUNTER — Ambulatory Visit: Payer: 59 | Admitting: Pharmacist

## 2020-03-22 DIAGNOSIS — I471 Supraventricular tachycardia: Secondary | ICD-10-CM

## 2020-03-22 DIAGNOSIS — E7800 Pure hypercholesterolemia, unspecified: Secondary | ICD-10-CM

## 2020-03-22 DIAGNOSIS — E1165 Type 2 diabetes mellitus with hyperglycemia: Secondary | ICD-10-CM

## 2020-03-22 DIAGNOSIS — I10 Essential (primary) hypertension: Secondary | ICD-10-CM

## 2020-03-22 MED ORDER — ROSUVASTATIN CALCIUM 5 MG PO TABS
5.0000 mg | ORAL_TABLET | Freq: Every day | ORAL | 3 refills | Status: DC
Start: 2020-03-22 — End: 2021-03-11

## 2020-03-22 NOTE — Patient Instructions (Signed)
Visit Information  PATIENT GOALS: Goals Addressed              This Visit's Progress     Patient Stated   .  Medication Monitoring (pt-stated)        Patient Goals/Self-Care Activities . Over the next 90 days, patient will:  - take medications as prescribed check blood glucose BID, document, and provide at future appointments collaborate with provider on medication access solutions        Patient verbalizes understanding of instructions provided today and agrees to view in MyChart.   Plan: Telephone follow up appointment with care management team member scheduled for:  ~ 6 weeks  [name] Feliz Beam, PharmD, [place], CPP Clinical Pharmacist [facility] [phone]

## 2020-03-22 NOTE — Telephone Encounter (Signed)
Please call Amber Decker.  She had an appt with [name] and [name] informed me she was having palpitations.  Confirm no acute problems/symptoms.  Please schedule an appt with me for further evaluation and w/up.

## 2020-03-22 NOTE — Chronic Care Management (AMB) (Signed)
Chronic Care Management Pharmacy Note  03/22/2020 Name:  Amber Decker MRN:  [id number] DOB:  1955/03/06  Subjective: Amber Decker is an 65 y.o. year old female who is a primary patient of Dale [place], MD.  The CCM team was consulted for assistance with disease management and care coordination needs.    Engaged with patient by telephone for reponse to her call in response to provider referral for pharmacy case management and/or care coordination services.   Consent to Services:  The patient was given information about Chronic Care Management services, agreed to services, and gave verbal consent prior to initiation of services.  Please see initial visit note for detailed documentation.   Objective:  Lab Results  Component Value Date   CREATININE 0.98 02/01/2020   CREATININE 1.09 06/30/2019   CREATININE 0.92 04/28/2018    Lab Results  Component Value Date   HGBA1C 7.1 (H) 02/01/2020       Component Value Date/Time   CHOL 193 02/01/2020 0906   TRIG 86.0 02/01/2020 0906   HDL 71.80 02/01/2020 0906   CHOLHDL 3 02/01/2020 0906   VLDL 17.2 02/01/2020 0906   LDLCALC 104 (H) 02/01/2020 0906    Clinical ASCVD: No  The 10-year ASCVD risk score Denman George DC Jr., et al., 2013) is: 14%   Values used to calculate the score:     Age: 58 years     Sex: Female     Is Non-Hispanic African American: Yes     Diabetic: Yes     Tobacco smoker: No     Systolic Blood Pressure: 120 mmHg     Is BP treated: Yes     HDL Cholesterol: 71.8 mg/dL     Total Cholesterol: 193 mg/dL      BP Readings from Last 3 Encounters:  02/20/20 120/68  06/30/19 118/72  02/23/18 110/70    Assessment: Review of patient past medical history, allergies, medications, health status, including review of consultants reports, laboratory and other test data, was performed as part of comprehensive evaluation and provision of chronic care management services.   SDOH:  (Social Determinants of Health)  assessments and interventions performed: none today   CCM Care Plan  Allergies  Allergen Reactions  . Erythromycin Hives    Medications Reviewed Today    Reviewed by Lourena Simmonds, RPH-CPP (Pharmacist) on 03/07/20 at 1359  Med List Status: <None>  Medication Order Taking? Sig Documenting Provider Last Dose Status Informant  acyclovir (ZOVIRAX) 400 MG tablet [id number] Yes TAKE 1 TABLET (400 MG TOTAL) DAILY BY MOUTH. Dale [place], MD Taking Active   aspirin EC 81 MG tablet [id number] Yes Take 81 mg by mouth daily. [provider] Taking Active   Docusate Calcium (STOOL SOFTENER PO) [id number] Yes Take 400 mg by mouth daily as needed.  [provider] Taking Active   JARDIANCE 10 MG TABS tablet [id number] Yes Take 1 tablet by mouth once daily Dale [place], MD Taking Active   lisinopril-hydrochlorothiazide (ZESTORETIC) 10-12.5 MG tablet [id number] Yes TAKE 1 TABLET BY MOUTH EVERY DAY Dale [place], MD Taking Active   metFORMIN (GLUCOPHAGE-XR) 500 MG 24 hr tablet [id number] Yes TAKE 2 TABLETS BY MOUTH IN THE MORNING AND 2 TABLETS AT BEDTIME Dale [place], MD Taking Active   metoprolol tartrate (LOPRESSOR) 25 MG tablet [id number] Yes Take 1 tablet by mouth twice daily Dale , MD Taking Active   Multiple Vitamins-Minerals (ALIVE WOMENS 50+ PO) [id number] Yes Take by mouth every other day.  [provider] Taking Active   Omega-3 Fatty Acids (FISH OIL) 500 MG CAPS [id number] Yes Take 1 capsule by mouth daily.  [provider] Taking Active   Probiotic Product (PROBIOTIC DAILY PO) [id number] Yes Take 1 capsule by mouth daily. [provider] Taking Active           Patient Active Problem List   Diagnosis Date Noted  . Hypercholesteremia 02/26/2020  . Constipation 11/06/2019  . Cracked skin on feet 07/03/2019  . Abnormal mammogram 12/06/2017  . Colon cancer screening 12/06/2017  . Leukopenia 08/08/2017  . Mass of upper outer  quadrant of left breast 01/15/2017  . BMI 40.0-44.9, adult (HCC) 07/24/2016  . 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 Health care maintenance 11/18/2014  . Shoulder pain 06/01/2013  . GERD (gastroesophageal reflux disease) 02/20/2013  . SVT (supraventricular tachycardia) (HCC) 03/25/2012  . Essential hypertension, benign 03/25/2012  . Type 2 diabetes mellitus with hyperglycemia (HCC) 03/25/2012  . Myasthenia gravis (HCC) 03/25/2012    Conditions to be addressed/monitored: HTN, HLD and DMII  Care Plan : Medication Management  Updates made by Lourena Simmonds, RPH-CPP since 03/22/2020 12:00 AM    Problem: Diabetes     Long-Range Goal: Disease Progression Prevention   This Visit's Progress: On track  Recent Progress: On track  Priority: High  Note:   Current Barriers:  . Unable to independently afford treatment regimen . Unable to achieve control of diabetes   Pharmacist Clinical Goal(s):  [name] Over the next 90 days, patient will verbalize ability to afford treatment regimen. . Over the next 90 days, patient will achieve control of diabetes s evidenced by improvement in A1c through collaboration with PharmD and provider  Interventions: . 1:1 collaboration with Dale [place], MD regarding development and update of comprehensive plan of care as evidenced by provider attestation and co-signature . Inter-disciplinary care team collaboration (see longitudinal plan of care) . Comprehensive medication review performed; medication list updated in electronic medical record  Diabetes: . Uncontrolled but improved; current treatment: metformin XR 1000 mg BID, Jardiance 10 mg daily . Requiring updated proof of income information for BI patient assistance. Patient will bring by today.    Hypertension: . Controlled; current treatment: losartan/HCTZ 10/12.5 mg daily, metoprolol tartrate 25 mg BID . Patient does report "palpitations" at night, most nights. Denies chest pain, SOB, headache. Is unsure if  "palpitations" are related to anxiety. Will pass along to PCP to discuss evaluation.  . Current home readings: not checking at this time  . Recommended to continue current regimen . Discussed beneficial impact of exercise on blood pressure.   Hyperlipidemia and ASCVD risk reduction: . Uncontrolled; current treatment: none; has been advised of statin therapy but patient has declined. Notes today that she would be interested and willing to start a medication today . Antiplatelet regimen: aspirin 81 mg daily . Praised for willingness to start statin therapy. Start rosuvastatin 5 mg daily. Hepatic function panel ordered, lab work scheduled in ~ 6 weeks.   Patient Goals/Self-Care Activities . Over the next 90 days, patient will:  - take medications as prescribed check blood glucose BID, document, and provide at future appointments collaborate with provider on medication access solutions  Follow Up Plan: Telephone follow up appointment with care management team member scheduled for: ~ 6 weeks      Medication Assistance: None required.  Patient affirms current coverage meets needs.  Follow Up:  Patient agrees to Care Plan and Follow-up.  Plan: Telephone follow up appointment with  care management team member scheduled for:  ~ 6 weeks  [name] Feliz Beam, PharmD, [place], CPP Clinical Pharmacist [facility] [employer] [phone]

## 2020-03-23 NOTE — Telephone Encounter (Signed)
Pt scheduled  

## 2020-03-28 ENCOUNTER — Ambulatory Visit (INDEPENDENT_AMBULATORY_CARE_PROVIDER_SITE_OTHER): Payer: 59 | Admitting: Internal Medicine

## 2020-03-28 ENCOUNTER — Other Ambulatory Visit: Payer: Self-pay

## 2020-03-28 ENCOUNTER — Encounter: Payer: Self-pay | Admitting: Internal Medicine

## 2020-03-28 VITALS — BP 122/70 | HR 76 | Temp 98.2°F | Resp 16 | Ht 69.0 in | Wt 274.0 lb

## 2020-03-28 DIAGNOSIS — E7800 Pure hypercholesterolemia, unspecified: Secondary | ICD-10-CM

## 2020-03-28 DIAGNOSIS — I471 Supraventricular tachycardia: Secondary | ICD-10-CM | POA: Diagnosis not present

## 2020-03-28 DIAGNOSIS — G7000 Myasthenia gravis without (acute) exacerbation: Secondary | ICD-10-CM

## 2020-03-28 DIAGNOSIS — E1165 Type 2 diabetes mellitus with hyperglycemia: Secondary | ICD-10-CM

## 2020-03-28 DIAGNOSIS — R002 Palpitations: Secondary | ICD-10-CM | POA: Diagnosis not present

## 2020-03-28 DIAGNOSIS — I10 Essential (primary) hypertension: Secondary | ICD-10-CM

## 2020-03-28 DIAGNOSIS — K219 Gastro-esophageal reflux disease without esophagitis: Secondary | ICD-10-CM

## 2020-03-28 DIAGNOSIS — R928 Other abnormal and inconclusive findings on diagnostic imaging of breast: Secondary | ICD-10-CM

## 2020-03-28 NOTE — Progress Notes (Signed)
Patient ID: Amber Decker, female   DOB: [birth year], 65 y.o.   MRN: [id number]   Subjective:    Patient ID: Amber Decker, female    DOB: [birth year], 65 y.o.   MRN: [id number]  HPI This visit occurred during the SARS-CoV-2 public health emergency.  Safety protocols were in place, including screening questions prior to the visit, additional usage of staff PPE, and extensive cleaning of exam room while observing appropriate contact time as indicated for disinfecting solutions.  Patient here for work in appt.  Work in for palpitations.  Has a documented history of SVT.  Has been on metoprolol.  Has done well.  Noticed approximately one month ago, some indigestion.  Described as pressure - anterior chest.  Felt like needed to belch.  Once she would belch - the sensation would ease off.  Some minimal acid reflux.  No significant dysphagia. Did report potatoes stick.  No sob.  Noticed palpitations more at night.  Feels similar to previous episodes.  No chest pain with activity.  No abdominal pain.  Bowels moving.  Blood pressure doing well.  Handling stress.   Past Medical History:  Diagnosis Date  . Diabetes mellitus without complication ([place]) 6433  . Herpes   . Hypertension   . Myasthenia gravis [facility])    onset age 58's?  . SVT (supraventricular tachycardia) ([place])    Past Surgical History:  Procedure Laterality Date  . MYOMECTOMY    . UTERINE FIBROID SURGERY     at [facility] History  Problem Relation Age of Onset  . Diabetes Mother   . Alcohol abuse Father   . Heart disease Father        myocardial infarction  . Breast cancer Neg Hx   . Colon cancer Neg Hx    Social History   Socioeconomic History  . Marital status: Divorced    Spouse name: Not on file  . Number of children: 0  . Years of education: Not on file  . Highest education level: Not on file  Occupational History    Employer: OTHER  Tobacco Use  . Smoking status: Never Smoker  . Smokeless tobacco: Never Used   Substance and Sexual Activity  . Alcohol use: Yes    Alcohol/week: 0.0 standard drinks    Comment: occasionally  . Drug use: No  . Sexual activity: Not on file  Other Topics Concern  . Not on file  Social History Narrative  . Not on file   Social Determinants of Health   Financial Resource Strain: Medium Risk  . Difficulty of Paying Living Expenses: Somewhat hard  Food Insecurity: Not on file  Transportation Needs: Not on file  Physical Activity: Not on file  Stress: Not on file  Social Connections: Not on file    Outpatient Encounter Medications as of 03/28/2020  Medication Sig  . acyclovir (ZOVIRAX) 400 MG tablet TAKE 1 TABLET (400 MG TOTAL) DAILY BY MOUTH.  [name] aspirin EC 81 MG tablet Take 81 mg by mouth daily.  [name] Calcium (STOOL SOFTENER PO) Take 400 mg by mouth daily as needed.   [name] JARDIANCE 10 MG TABS tablet Take 1 tablet by mouth once daily  . lisinopril-hydrochlorothiazide (ZESTORETIC) 10-12.5 MG tablet TAKE 1 TABLET BY MOUTH EVERY DAY  . metFORMIN (GLUCOPHAGE-XR) 500 MG 24 hr tablet TAKE 2 TABLETS BY MOUTH IN THE MORNING AND 2 TABLETS AT BEDTIME  . metoprolol tartrate (LOPRESSOR) 25 MG tablet Take 1 tablet by mouth twice  daily  . Multiple Vitamins-Minerals (ALIVE WOMENS 50+ PO) Take by mouth every other day.   . Omega-3 Fatty Acids (FISH OIL) 500 MG CAPS Take 1 capsule by mouth daily.   . Probiotic Product (PROBIOTIC DAILY PO) Take 1 capsule by mouth daily.  . rosuvastatin (CRESTOR) 5 MG tablet Take 1 tablet (5 mg total) by mouth daily.   No facility-administered encounter medications on file as of 03/28/2020.    Review of Systems  Constitutional: Negative for appetite change and unexpected weight change.  HENT: Negative for congestion and sinus pressure.   Respiratory: Negative for cough and shortness of breath.   Cardiovascular: Negative for leg swelling.       Chest discomfort as outlined.  Palpitations as outlined.    Gastrointestinal: Negative for  abdominal pain, diarrhea, nausea and vomiting.  Genitourinary: Negative for difficulty urinating and dysuria.  Musculoskeletal: Negative for joint swelling and myalgias.  Skin: Negative for color change and rash.  Neurological: Negative for dizziness, light-headedness and headaches.  Psychiatric/Behavioral: Negative for agitation and dysphoric mood.       Objective:    Physical Exam Vitals reviewed.  Constitutional:      General: She is not in acute distress.    Appearance: Normal appearance.  HENT:     Head: Normocephalic and atraumatic.     Right Ear: External ear normal.     Left Ear: External ear normal.     Mouth/Throat:     Mouth: Oropharynx is clear and moist.  Eyes:     General: No scleral icterus.       Right eye: No discharge.        Left eye: No discharge.  Neck:     Thyroid: No thyromegaly.  Cardiovascular:     Rate and Rhythm: Normal rate and regular rhythm.  Pulmonary:     Effort: No respiratory distress.     Breath sounds: Normal breath sounds. No wheezing.  Abdominal:     General: Bowel sounds are normal.     Palpations: Abdomen is soft.     Tenderness: There is no abdominal tenderness.  Musculoskeletal:        General: No swelling, tenderness or edema.     Cervical back: Neck supple. No tenderness.  Lymphadenopathy:     Cervical: No cervical adenopathy.  Skin:    Findings: No erythema or rash.  Neurological:     Mental Status: She is alert.  Psychiatric:        Mood and Affect: Mood normal.        Behavior: Behavior normal.     BP 122/70   Pulse 76   Temp 98.2 F (36.8 C) (Oral)   Resp 16   Ht _0  (1.753 m)   Wt 274 lb (124.3 kg)   LMP 02/21/2012   SpO2 98%   BMI 40.46 kg/m  Wt Readings from Last 3 Encounters:  03/28/20 274 lb (124.3 kg)  02/20/20 272 lb (123.4 kg)  06/30/19 275 lb 3.2 oz (124.8 kg)     Lab Results  Component Value Date   WBC 4.6 03/19/2020   HGB 14.7 03/19/2020   HCT 43.6 03/19/2020   PLT 198.0 03/19/2020    GLUCOSE 128 (H) 02/01/2020   CHOL 193 02/01/2020   TRIG 86.0 02/01/2020   HDL 71.80 02/01/2020   LDLCALC 104 (H) 02/01/2020   ALT 12 02/01/2020   AST 14 02/01/2020   NA 140 02/01/2020   K 4.1 02/01/2020   CL 106  02/01/2020   CREATININE 0.98 02/01/2020   BUN 19 02/01/2020   CO2 28 02/01/2020   TSH 1.32 06/30/2019   HGBA1C 7.1 (H) 02/01/2020   MICROALBUR <0.7 06/30/2019    MM 3D SCREEN BREAST BILATERAL  Result Date: 07/07/2019 CLINICAL DATA:  Screening. EXAM: DIGITAL SCREENING BILATERAL MAMMOGRAM WITH TOMO AND CAD COMPARISON:  Previous exam(s). ACR Breast Density Category b: There are scattered areas of fibroglandular density. FINDINGS: In the right breast possible calcifications requires further evaluation. In the left breast possible mass requires further evaluation. Images were processed with CAD. IMPRESSION: Further evaluation is suggested for possible calcifications in the right breast. Further evaluation is suggested for possible mass in the left breast. RECOMMENDATION: Diagnostic mammogram and possibly ultrasound of both breasts. (Code:[id number]) The patient will be contacted regarding the findings, and additional imaging will be scheduled. BI-RADS CATEGORY  0: Incomplete. Need additional imaging evaluation and/or prior mammograms for comparison. Electronically Signed   By: Audie Pinto M.D.   On: 07/07/2019 16:50       Assessment & Plan:   Problem List Items Addressed This Visit    Abnormal mammogram    Overdue f/u mammogram.  Scheduled.       Essential hypertension, benign    Continue lisinopril/hctz and metoprolol.  Follow pressures.  Follow metabolic panel.       GERD (gastroesophageal reflux disease)    Discussed starting pepcid.  Follow.       Hypercholesteremia    On crestor.  Low cholesterol diet and exercise.  Follow lipid panel and liver function tests.        Myasthenia gravis ([place])    Stable.       SVT (supraventricular tachycardia) ([place])    Has  a history of SVT.  On metoprolol.  Recently noticed increased palpitations.  Some indigestion and minimal acid reflux with some intermittent chest pressure as outlined.  EKG - SR with no acute ischemic changes.  Continue metoprolol.  Treat acid reflux.  Discussed further cardiac w/up.  She is agreeable.  Refer back to cardiology.  Discussed zio monitor, echo, etc.        Type 2 diabetes mellitus with hyperglycemia ([place])    On metformin and jardiance.  Low carb diet and exercise.  Follow met b and a1c.        Other Visit Diagnoses    Palpitations    -  Primary   Relevant Orders   EKG 12-Lead (Completed)   Ambulatory referral to Cardiology       Einar Pheasant, MD

## 2020-04-01 ENCOUNTER — Encounter: Payer: Self-pay | Admitting: Internal Medicine

## 2020-04-01 NOTE — Assessment & Plan Note (Signed)
On crestor.  Low cholesterol diet and exercise.  Follow lipid panel and liver function tests.   

## 2020-04-01 NOTE — Assessment & Plan Note (Signed)
Stable

## 2020-04-01 NOTE — Assessment & Plan Note (Signed)
On metformin and jardiance.  Low carb diet and exercise.  Follow met b and a1c.

## 2020-04-01 NOTE — Assessment & Plan Note (Signed)
Continue lisinopril/hctz and metoprolol.  Follow pressures.  Follow metabolic panel.  

## 2020-04-01 NOTE — Assessment & Plan Note (Signed)
Overdue f/u mammogram.  Scheduled.

## 2020-04-01 NOTE — Assessment & Plan Note (Addendum)
Has a history of SVT.  On metoprolol.  Recently noticed increased palpitations.  Some indigestion and minimal acid reflux with some intermittent chest pressure as outlined.  EKG - SR with no acute ischemic changes.  Continue metoprolol.  Treat acid reflux.  Discussed further cardiac w/up.  She is agreeable.  Refer back to cardiology.  Discussed zio monitor, echo, etc.

## 2020-04-01 NOTE — Assessment & Plan Note (Signed)
Discussed starting pepcid.  Follow.

## 2020-04-05 ENCOUNTER — Ambulatory Visit: Payer: 59 | Admitting: Pharmacist

## 2020-04-05 DIAGNOSIS — I471 Supraventricular tachycardia: Secondary | ICD-10-CM

## 2020-04-05 DIAGNOSIS — E1165 Type 2 diabetes mellitus with hyperglycemia: Secondary | ICD-10-CM

## 2020-04-05 DIAGNOSIS — E7800 Pure hypercholesterolemia, unspecified: Secondary | ICD-10-CM

## 2020-04-05 DIAGNOSIS — I10 Essential (primary) hypertension: Secondary | ICD-10-CM

## 2020-04-05 MED ORDER — DAPAGLIFLOZIN PROPANEDIOL 10 MG PO TABS: 10.0000 mg | ORAL_TABLET | Freq: Every day | ORAL | 1 refills | Status: DC

## 2020-04-05 NOTE — Chronic Care Management (AMB) (Signed)
Care Management   Pharmacy Note  04/05/2020 Name: Amber Decker MRN: [id number] DOB: 04/13/1955  Subjective: Amber Decker is a 65 y.o. year old female who is a primary care patient of Dale [place], MD. The Care Management team was consulted for assistance with care management and care coordination needs.    Engaged with patient by telephone for care coordination for medication access in response to provider referral for pharmacy case management and/or care coordination services.   The patient was given information about Care Management services today including:  1. Care Management services includes personalized support from designated clinical staff supervised by the patient's primary care provider, including individualized plan of care and coordination with other care providers. 2. 24/7 contact phone numbers for assistance for urgent and routine care needs. 3. The patient may stop case management services at any time by phone call to the office staff.  Patient agreed to services and consent obtained.  Assessment:  Review of patient status, including review of consultants reports, laboratory and other test data, was performed as part of comprehensive evaluation and provision of chronic care management services.   SDOH (Social Determinants of Health) assessments and interventions performed:    Objective:  Lab Results  Component Value Date   CREATININE 0.98 02/01/2020   CREATININE 1.09 06/30/2019   CREATININE 0.92 04/28/2018    Lab Results  Component Value Date   HGBA1C 7.1 (H) 02/01/2020       Component Value Date/Time   CHOL 193 02/01/2020 0906   TRIG 86.0 02/01/2020 0906   HDL 71.80 02/01/2020 0906   CHOLHDL 3 02/01/2020 0906   VLDL 17.2 02/01/2020 0906   LDLCALC 104 (H) 02/01/2020 0906    Other: (TSH, CBC, Vit D, etc.)  Clinical ASCVD: No  The 10-year ASCVD risk score Denman George DC Jr., et al., 2013) is: 14.5%   Values used to calculate the score:     Age: 17  years     Sex: Female     Is Non-Hispanic African American: Yes     Diabetic: Yes     Tobacco smoker: No     Systolic Blood Pressure: 122 mmHg     Is BP treated: Yes     HDL Cholesterol: 71.8 mg/dL     Total Cholesterol: 193 mg/dL    BP Readings from Last 3 Encounters:  03/28/20 122/70  02/20/20 120/68  06/30/19 118/72    Care Plan  Allergies  Allergen Reactions  . Erythromycin Hives    Medications Reviewed Today    Reviewed by Dale Negley, MD (Physician) on 04/01/20 at 1419  Med List Status: <None>  Medication Order Taking? Sig Documenting Provider Last Dose Status Informant  acyclovir (ZOVIRAX) 400 MG tablet [id number] No TAKE 1 TABLET (400 MG TOTAL) DAILY BY MOUTH. Dale Stearns, MD Taking Active   aspirin EC 81 MG tablet [id number] No Take 81 mg by mouth daily. [provider] Taking Active   Docusate Calcium (STOOL SOFTENER PO) [id number] No Take 400 mg by mouth daily as needed.  [provider] Taking Active   JARDIANCE 10 MG TABS tablet [id number] No Take 1 tablet by mouth once daily Dale Gleed, MD Taking Active   lisinopril-hydrochlorothiazide (ZESTORETIC) 10-12.5 MG tablet [id number] No TAKE 1 TABLET BY MOUTH EVERY DAY Dale [place], MD Taking Active   metFORMIN (GLUCOPHAGE-XR) 500 MG 24 hr tablet [id number] No TAKE 2 TABLETS BY MOUTH IN THE MORNING AND 2 TABLETS AT BEDTIME Dale [place]abena, MD Taking Active  metoprolol tartrate (LOPRESSOR) 25 MG tablet [id number] No Take 1 tablet by mouth twice daily Dale Talala, MD Taking Active   Multiple Vitamins-Minerals (ALIVE WOMENS 50+ PO) [id number] No Take by mouth every other day.  [provider] Taking Active   Omega-3 Fatty Acids (FISH OIL) 500 MG CAPS [id number] No Take 1 capsule by mouth daily.  [provider] Taking Active   Probiotic Product (PROBIOTIC DAILY PO) [id number] No Take 1 capsule by mouth daily. [provider] Taking Active   rosuvastatin (CRESTOR) 5 MG  tablet [id number]  Take 1 tablet (5 mg total) by mouth daily. Dale Geraldine, MD  Active           Patient Active Problem List   Diagnosis Date Noted  . Hypercholesteremia 02/26/2020  . Constipation 11/06/2019  . Cracked skin on feet 07/03/2019  . Abnormal mammogram 12/06/2017  . Colon cancer screening 12/06/2017  . Leukopenia 08/08/2017  . Mass of upper outer quadrant of left breast 01/15/2017  . BMI 40.0-44.9, adult ([facility]) 07/24/2016  . 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 Health care maintenance 11/18/2014  . Shoulder pain 06/01/2013  . GERD (gastroesophageal reflux disease) 02/20/2013  . SVT (supraventricular tachycardia) ([facility]) 03/25/2012  . Essential hypertension, benign 03/25/2012  . Type 2 diabetes mellitus with hyperglycemia ([facility]) 03/25/2012  . Myasthenia gravis ([facility]) 03/25/2012    Conditions to be addressed/monitored: HTN, HLD and DMII  Care Plan : Medication Management  Updates made by Lourena Simmonds, RPH-CPP since 04/05/2020 12:00 AM    Problem: Diabetes     Long-Range Goal: Disease Progression Prevention   Recent Progress: On track  Priority: High  Note:   Current Barriers:  . Unable to independently afford treatment regimen . Unable to achieve control of diabetes   Pharmacist Clinical Goal(s):  [name] Over the next 90 days, patient will verbalize ability to afford treatment regimen. . Over the next 90 days, patient will achieve control of diabetes s evidenced by improvement in A1c through collaboration with PharmD and provider  Interventions: . 1:1 collaboration with Dale Scotsdale, MD regarding development and update of comprehensive plan of care as evidenced by provider attestation and co-signature . Inter-disciplinary care team collaboration (see longitudinal plan of care) . Comprehensive medication review performed; medication list updated in electronic medical record  Diabetes: . Uncontrolled but improved; current treatment: metformin XR 1000 mg BID,  Jardiance 10 mg daily . Received notification that patient assistance for Jardinace was denied (income cut off was lowered since last year). No option for appeal and no other SGLT2 programs allow patients with commercial insurance to apply.  . Discussed that [place] + savings card may help pay towards high deductible. Patient amenable to me investigating. Sent script for Farxiga 10 mg daily, called pharmacy and ensured no PA. Provided savings card information. Unfortunately, the pharmacy needs new insurance information on file for the patient. Called patient, she will take that over at her earliest convenience.   Hypertension, hx SVT . Controlled; current treatment: losartan/HCTZ 10/12.5 mg daily, metoprolol tartrate 25 mg BID . Saw PCP last week for increased palpitations. EKG normal, referred to cardiology for further palpitation work up . Recommended to continue current regimen .   Hyperlipidemia and ASCVD risk reduction: . Uncontrolled but anticipated to be improved; current treatment: rosuvastatin 5 mg daily  . Antiplatelet regimen: aspirin 81 mg daily . F/u LFT and lipid panel scheduled.  Patient Goals/Self-Care Activities . Over the next 90 days, patient will:  - take medications as  prescribed check blood glucose BID, document, and provide at future appointments collaborate with provider on medication access solutions  Follow Up Plan: Telephone follow up appointment with care management team member scheduled for: ~ 6 weeks      Medication Assistance:  Working on medication access  Follow Up:  Patient agrees to Care Plan and Follow-up.  Plan: Telephone follow up appointment with care management team member scheduled for:  ~ 6 weeks as previously scheduled  [name] Feliz Beam, PharmD, [place], CPP Clinical Pharmacist [employer] [employer] [phone]

## 2020-04-05 NOTE — Patient Instructions (Signed)
Visit Information  Goals Addressed              This Visit's Progress     Patient Stated   .  Medication Monitoring (pt-stated)        Patient Goals/Self-Care Activities . Over the next 90 days, patient will:  - take medications as prescribed check blood glucose BID, document, and provide at future appointments collaborate with provider on medication access solutions         Patient verbalizes understanding of instructions provided today and agrees to view in MyChart.   Plan: Telephone follow up appointment with care management team member scheduled for:  ~ 6 weeks as previously scheduled  [name] Feliz Beam, PharmD, [place], CPP Clinical Pharmacist [facility] [employer] [phone]

## 2020-04-09 ENCOUNTER — Telehealth: Payer: 59

## 2020-04-10 ENCOUNTER — Ambulatory Visit
Admission: RE | Admit: 2020-04-10 | Discharge: 2020-04-10 | Disposition: A | Discharge status: Home / Self Care | Payer: 59 | Source: Ambulatory Visit | Attending: Internal Medicine | Admitting: Internal Medicine

## 2020-04-10 ENCOUNTER — Other Ambulatory Visit: Payer: Self-pay

## 2020-04-10 ENCOUNTER — Telehealth: Payer: 59

## 2020-04-10 DIAGNOSIS — N6320 Unspecified lump in the left breast, unspecified quadrant: Secondary | ICD-10-CM | POA: Insufficient documentation

## 2020-04-10 DIAGNOSIS — R921 Mammographic calcification found on diagnostic imaging of breast: Secondary | ICD-10-CM

## 2020-04-10 DIAGNOSIS — R928 Other abnormal and inconclusive findings on diagnostic imaging of breast: Secondary | ICD-10-CM

## 2020-04-11 ENCOUNTER — Other Ambulatory Visit: Payer: Self-pay | Admitting: Internal Medicine

## 2020-04-11 DIAGNOSIS — R928 Other abnormal and inconclusive findings on diagnostic imaging of breast: Secondary | ICD-10-CM

## 2020-04-11 NOTE — Progress Notes (Signed)
Order placed for f/u diagnostic mammogram and ultrasound.

## 2020-04-12 ENCOUNTER — Ambulatory Visit: Payer: 59 | Admitting: Pharmacist

## 2020-04-12 DIAGNOSIS — I10 Essential (primary) hypertension: Secondary | ICD-10-CM

## 2020-04-12 DIAGNOSIS — E1165 Type 2 diabetes mellitus with hyperglycemia: Secondary | ICD-10-CM

## 2020-04-12 MED ORDER — STEGLATRO 5 MG PO TABS
5.0000 mg | ORAL_TABLET | Freq: Every day | ORAL | 2 refills | Status: DC
Start: 2020-04-12 — End: 2020-04-13

## 2020-04-13 NOTE — Patient Instructions (Signed)
Visit Information  Goals Addressed              This Visit's Progress     Patient Stated   .  Medication Monitoring (pt-stated)        Patient Goals/Self-Care Activities . Over the next 90 days, patient will:  - take medications as prescribed check blood glucose BID, document, and provide at future appointments collaborate with provider on medication access solutions        The patient verbalized understanding of instructions, educational materials, and care plan provided today and declined offer to receive copy of patient instructions, educational materials, and care plan.   Plan: Telephone follow up appointment with care management team member scheduled for:  ~ 8 weeks  [name] Feliz Beam, PharmD, [place], CPP Clinical Pharmacist [facility] [employer] [phone]

## 2020-04-13 NOTE — Chronic Care Management (AMB) (Signed)
Care Management   Pharmacy Note  04/13/2020 Name: Amber Decker MRN: [id number] DOB: Apr 24, 1955  Subjective: Amber Decker is a 65 y.o. year old female who is a primary care patient of Dale [place], MD. The Care Management team was consulted for assistance with care management and care coordination needs.    Engaged with patient by telephone for follow up visit in response to provider referral for pharmacy case management and/or care coordination services.   The patient was given information about Care Management services today including:  1. Care Management services includes personalized support from designated clinical staff supervised by the patient's primary care provider, including individualized plan of care and coordination with other care providers. 2. 24/7 contact phone numbers for assistance for urgent and routine care needs. 3. The patient may stop case management services at any time by phone call to the office staff.  Patient agreed to services and consent obtained.  Assessment:  Review of patient status, including review of consultants reports, laboratory and other test data, was performed as part of comprehensive evaluation and provision of chronic care management services.   SDOH (Social Determinants of Health) assessments and interventions performed:    Objective:  Lab Results  Component Value Date   CREATININE 0.98 02/01/2020   CREATININE 1.09 06/30/2019   CREATININE 0.92 04/28/2018    Lab Results  Component Value Date   HGBA1C 7.1 (H) 02/01/2020       Component Value Date/Time   CHOL 193 02/01/2020 0906   TRIG 86.0 02/01/2020 0906   HDL 71.80 02/01/2020 0906   CHOLHDL 3 02/01/2020 0906   VLDL 17.2 02/01/2020 0906   LDLCALC 104 (H) 02/01/2020 0906   Clinical ASCVD: No  The 10-year ASCVD risk score Denman George DC Jr., et al., 2013) is: 14.5%   Values used to calculate the score:     Age: 42 years     Sex: Female     Is Non-Hispanic African American:  Yes     Diabetic: Yes     Tobacco smoker: No     Systolic Blood Pressure: 122 mmHg     Is BP treated: Yes     HDL Cholesterol: 71.8 mg/dL     Total Cholesterol: 193 mg/dL    BP Readings from Last 3 Encounters:  03/28/20 122/70  02/20/20 120/68  06/30/19 118/72    Care Plan  Allergies  Allergen Reactions  . Erythromycin Hives    Medications Reviewed Today    Reviewed by Lourena Simmonds, RPH-CPP (Pharmacist) on 04/13/20 at 1519  Med List Status: <None>  Medication Order Taking? Sig Documenting Provider Last Dose Status Informant  acyclovir (ZOVIRAX) 400 MG tablet [id number]  TAKE 1 TABLET (400 MG TOTAL) DAILY BY MOUTH. Dale [place], MD  Active   aspirin EC 81 MG tablet [id number] Yes Take 81 mg by mouth daily. [provider] Taking Active   Docusate Calcium (STOOL SOFTENER PO) [id number]  Take 400 mg by mouth daily as needed.  [provider]  Active   lisinopril-hydrochlorothiazide (ZESTORETIC) 10-12.5 MG tablet [id number] Yes TAKE 1 TABLET BY MOUTH EVERY DAY Dale [place], MD Taking Active   metFORMIN (GLUCOPHAGE-XR) 500 MG 24 hr tablet [id number] Yes TAKE 2 TABLETS BY MOUTH IN THE MORNING AND 2 TABLETS AT BEDTIME Dale [place], MD Taking Active   metoprolol tartrate (LOPRESSOR) 25 MG tablet [id number]  Take 1 tablet by mouth twice daily Dale [place], MD  Active   Multiple Vitamins-Minerals (ALIVE WOMENS 50+ PO) [id number]  Take by mouth every other day.  [provider]  Active   Omega-3 Fatty Acids (FISH OIL) 500 MG CAPS [id number]  Take 1 capsule by mouth daily.  [provider]  Active   Probiotic Product (PROBIOTIC DAILY PO) [id number]  Take 1 capsule by mouth daily. [provider]  Active   rosuvastatin (CRESTOR) 5 MG tablet [id number] Yes Take 1 tablet (5 mg total) by mouth daily. Dale [place], MD Taking Active           Patient Active Problem List   Diagnosis Date Noted  . Hypercholesteremia 02/26/2020  .  Constipation 11/06/2019  . Cracked skin on feet 07/03/2019  . Abnormal mammogram 12/06/2017  . Colon cancer screening 12/06/2017  . Leukopenia 08/08/2017  . Mass of upper outer quadrant of left breast 01/15/2017  . BMI 40.0-44.9, adult (HCC) 07/24/2016  . 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 Health care maintenance 11/18/2014  . Shoulder pain 06/01/2013  . GERD (gastroesophageal reflux disease) 02/20/2013  . SVT (supraventricular tachycardia) (HCC) 03/25/2012  . Essential hypertension, benign 03/25/2012  . Type 2 diabetes mellitus with hyperglycemia (HCC) 03/25/2012  . Myasthenia gravis (HCC) 03/25/2012    Conditions to be addressed/monitored: HTN, HLD and DMII  Care Plan : Medication Management  Updates made by Lourena Simmonds, RPH-CPP since 04/13/2020 12:00 AM    Problem: Diabetes     Long-Range Goal: Disease Progression Prevention   This Visit's Progress: On track  Recent Progress: On track  Priority: High  Note:   Current Barriers:  . Unable to independently afford treatment regimen . Unable to achieve control of diabetes   Pharmacist Clinical Goal(s):  [name] Over the next 90 days, patient will verbalize ability to afford treatment regimen. . Over the next 90 days, patient will achieve control of diabetes s evidenced by improvement in A1c through collaboration with PharmD and provider  Interventions: . 1:1 collaboration with Dale [place], MD regarding development and update of comprehensive plan of care as evidenced by provider attestation and co-signature . Inter-disciplinary care team collaboration (see longitudinal plan of care) . Comprehensive medication review performed; medication list updated in electronic medical record  Diabetes: . Uncontrolled but improved; current treatment: metformin XR 1000 mg BID, Jardiance 10 mg daily - though has just a few days left . Received notification that patient assistance for Jardinace was denied (income cut off was lowered since last  year). No option for appeal and no other SGLT2 programs allow patients with commercial insurance to apply.  . Patient reports that [place] + savings card was still several hundred dollars.  . Discussed that [place] + savings card without savings card may be an affordable way to remain on SGLT2 therapy. Contacted Ste[place] savings program, assisted patient in downloading savings card (ID: [id number]; Group: [id number]; BIN [id number]; PCN: [id number]). Start Steglatro 5 mg daily when [place] supply is completed.  [facility] pharmacy to follow up. Unfortunately, Steglatro savings card now has to be run with insurance. Patient's insurance does not cover Ste[place]atro and they would not approve a PA because she has not "tried and failed" [place] and [place].  . Discussed with patient. Next steps would have to be sulfonylurea (GLP1, DPP4, SGLT2, basal insulin are all going to be the same expensive price due to high combined medical + pharmacy deductible). Risk of hypoglycemia, weight gain is not preferred.  . Patient asked if she could change from metformin to something else, due to the fact that she has read about lactic acidosis.  Discussed that this is not a concern in patients with appropriate renal function and with appropriate dose reductions if renal function declines. Discussed that vitamin B12 deficiency may be a long term effect with metformin, but that otherwise the medication is safe. Patient agreeable to continue metformin monotherapy once she completes home supply with Jardiance. Continue to focus on diet, physical activity. Continue home BG checks. Will evaluate next A1c and home readings and determine next steps . Consider CGM moving forward to aid in better dietary evaluation.   Hypertension, hx SVT . Controlled; current treatment: losartan/HCTZ 10/12.5 mg daily, metoprolol tartrate 25 mg BID . Saw PCP last week for increased palpitations. EKG normal, referred to cardiology for further palpitation work  up. Appt next week . Recommended to continue current regimen .   Hyperlipidemia and ASCVD risk reduction: . Uncontrolled but anticipated to be improved; current treatment: rosuvastatin 5 mg daily  . Antiplatelet regimen: aspirin 81 mg daily . F/u LFT and lipid panel scheduled.  Patient Goals/Self-Care Activities . Over the next 90 days, patient will:  - take medications as prescribed check blood glucose BID, document, and provide at future appointments collaborate with provider on medication access solutions  Follow Up Plan: Telephone follow up appointment with care management team member scheduled for: ~ 8 weeks      Medication Assistance:  None required.  Patient affirms current coverage meets needs.  Follow Up:  Patient agrees to Care Plan and Follow-up.  Plan: Telephone follow up appointment with care management team member scheduled for:  ~ 8 weeks  [name] Feliz Beam, PharmD, [place], CPP Clinical Pharmacist [employer] [facility] [employer] [phone]

## 2020-04-15 ENCOUNTER — Other Ambulatory Visit: Payer: Self-pay | Admitting: Internal Medicine

## 2020-04-24 ENCOUNTER — Telehealth: Payer: 59

## 2020-04-30 ENCOUNTER — Telehealth: Payer: 59

## 2020-05-01 ENCOUNTER — Other Ambulatory Visit (INDEPENDENT_AMBULATORY_CARE_PROVIDER_SITE_OTHER): Payer: 59

## 2020-05-01 ENCOUNTER — Other Ambulatory Visit: Payer: 59

## 2020-05-01 ENCOUNTER — Other Ambulatory Visit: Payer: Self-pay

## 2020-05-01 DIAGNOSIS — E7800 Pure hypercholesterolemia, unspecified: Secondary | ICD-10-CM

## 2020-05-01 LAB — HEPATIC FUNCTION PANEL
ALT: 12 U/L (ref 0–35)
AST: 15 U/L (ref 0–37)
Albumin: 4.3 g/dL (ref 3.5–5.2)
Alkaline Phosphatase: 51 U/L (ref 39–117)
Bilirubin, Direct: 0.1 mg/dL (ref 0.0–0.3)
Total Bilirubin: 0.5 mg/dL (ref 0.2–1.2)
Total Protein: 6.9 g/dL (ref 6.0–8.3)

## 2020-05-09 ENCOUNTER — Telehealth: Payer: Self-pay | Admitting: Internal Medicine

## 2020-05-09 NOTE — Telephone Encounter (Signed)
Patient called in about her reading the cardio doctor rescheduled her appointment for  Next Tuesday she wanted to know if she needs to keep her Monday appointment with Dr.Scott since she has to go the cardio doctor on 4-5

## 2020-05-10 NOTE — Telephone Encounter (Signed)
If she wants to postpone until after her cardiology appt - I am ok with that.

## 2020-05-10 NOTE — Telephone Encounter (Signed)
Can you call and move her appt out until some time after her cardiology appt on 4/5.

## 2020-05-10 NOTE — Telephone Encounter (Signed)
Do you want to wait and follow up with her after she sees cardiology

## 2020-05-14 ENCOUNTER — Ambulatory Visit: Payer: 59 | Admitting: Internal Medicine

## 2020-05-22 ENCOUNTER — Other Ambulatory Visit: Payer: Self-pay

## 2020-05-22 ENCOUNTER — Ambulatory Visit (INDEPENDENT_AMBULATORY_CARE_PROVIDER_SITE_OTHER): Payer: 59 | Admitting: Internal Medicine

## 2020-05-22 DIAGNOSIS — G7000 Myasthenia gravis without (acute) exacerbation: Secondary | ICD-10-CM

## 2020-05-22 DIAGNOSIS — E1165 Type 2 diabetes mellitus with hyperglycemia: Secondary | ICD-10-CM

## 2020-05-22 DIAGNOSIS — I10 Essential (primary) hypertension: Secondary | ICD-10-CM

## 2020-05-22 DIAGNOSIS — E7800 Pure hypercholesterolemia, unspecified: Secondary | ICD-10-CM

## 2020-05-22 DIAGNOSIS — I471 Supraventricular tachycardia: Secondary | ICD-10-CM

## 2020-05-22 NOTE — Progress Notes (Signed)
Patient ID: Amber Decker, female   DOB: 1955/12/07, 65 y.o.   MRN: [id number]   Subjective:    Patient ID: Amber Decker, female    DOB: 20-May-1955, 65 y.o.   MRN: [id number]  HPI This visit occurred during the SARS-CoV-2 public health emergency.  Safety protocols were in place, including screening questions prior to the visit, additional usage of staff PPE, and extensive cleaning of exam room while observing appropriate contact time as indicated for disinfecting solutions.  Patient here for a scheduled follow up.  Recently saw cardiology for evaluation - palpitations.  holter monitor revealed predominant SR.  Frequent PVCs present.  Frequent atrial runs - longest 10 beats. Maintained on metoprolol.  Advised to limit caffeine intake.  She reports she is doing better.  No chest pain reported.  Breathing stable.  No acid reflux reported.  No abdominal pain.  Bowels moving.  Blood pressure doing well.  Is exercising - walking 3x/week.  Discussed diet and exercise.    Past Medical History:  Diagnosis Date  . Diabetes mellitus without complication ([place]) 3300  . Herpes   . Hypertension   . Myasthenia gravis [facility])    onset age 67's?  . SVT (supraventricular tachycardia) ([place])    Past Surgical History:  Procedure Laterality Date  . MYOMECTOMY    . UTERINE FIBROID SURGERY     at [facility] History  Problem Relation Age of Onset  . Diabetes Mother   . Alcohol abuse Father   . Heart disease Father        myocardial infarction  . Breast cancer Neg Hx   . Colon cancer Neg Hx    Social History   Socioeconomic History  . Marital status: Divorced    Spouse name: Not on file  . Number of children: 0  . Years of education: Not on file  . Highest education level: Not on file  Occupational History    Employer: OTHER  Tobacco Use  . Smoking status: Never Smoker  . Smokeless tobacco: Never Used  Substance and Sexual Activity  . Alcohol use: Yes    Alcohol/week: 0.0 standard drinks     Comment: occasionally  . Drug use: No  . Sexual activity: Not on file  Other Topics Concern  . Not on file  Social History Narrative  . Not on file   Social Determinants of Health   Financial Resource Strain: Medium Risk  . Difficulty of Paying Living Expenses: Somewhat hard  Food Insecurity: Not on file  Transportation Needs: Not on file  Physical Activity: Not on file  Stress: Not on file  Social Connections: Not on file    Outpatient Encounter Medications as of 05/22/2020  Medication Sig  . acyclovir (ZOVIRAX) 400 MG tablet TAKE 1 TABLET (400 MG TOTAL) DAILY BY MOUTH.  [name] aspirin EC 81 MG tablet Take 81 mg by mouth daily.  [name] Calcium (STOOL SOFTENER PO) Take 400 mg by mouth daily as needed.   [name] lisinopril-hydrochlorothiazide (ZESTORETIC) 10-12.5 MG tablet TAKE 1 TABLET BY MOUTH EVERY DAY  . metFORMIN (GLUCOPHAGE-XR) 500 MG 24 hr tablet TAKE 2 TABLETS BY MOUTH IN THE MORNING AND 2 TABLETS AT BEDTIME  . metoprolol tartrate (LOPRESSOR) 25 MG tablet Take 1 tablet by mouth twice daily  . Multiple Vitamins-Minerals (ALIVE WOMENS 50+ PO) Take by mouth every other day.   . Omega-3 Fatty Acids (FISH OIL) 500 MG CAPS Take 1 capsule by mouth daily.   . Probiotic  Product (PROBIOTIC DAILY PO) Take 1 capsule by mouth daily.  . rosuvastatin (CRESTOR) 5 MG tablet Take 1 tablet (5 mg total) by mouth daily.   No facility-administered encounter medications on file as of 05/22/2020.    Review of Systems  Constitutional: Negative for appetite change and unexpected weight change.  HENT: Negative for congestion and sinus pressure.   Respiratory: Negative for cough, chest tightness and shortness of breath.   Cardiovascular: Negative for chest pain, palpitations and leg swelling.  Gastrointestinal: Negative for abdominal pain, diarrhea, nausea and vomiting.  Genitourinary: Negative for difficulty urinating and dysuria.  Musculoskeletal: Negative for joint swelling and myalgias.  Skin:  Negative for color change and rash.  Neurological: Negative for dizziness, light-headedness and headaches.  Psychiatric/Behavioral: Negative for agitation and dysphoric mood.       Objective:    Physical Exam Vitals reviewed.  Constitutional:      General: She is not in acute distress.    Appearance: Normal appearance.  HENT:     Head: Normocephalic and atraumatic.     Right Ear: External ear normal.     Left Ear: External ear normal.  Eyes:     General: No scleral icterus.       Right eye: No discharge.        Left eye: No discharge.     Conjunctiva/sclera: Conjunctivae normal.  Neck:     Thyroid: No thyromegaly.  Cardiovascular:     Rate and Rhythm: Normal rate and regular rhythm.  Pulmonary:     Effort: No respiratory distress.     Breath sounds: Normal breath sounds. No wheezing.  Abdominal:     General: Bowel sounds are normal.     Palpations: Abdomen is soft.     Tenderness: There is no abdominal tenderness.  Musculoskeletal:        General: No swelling or tenderness.     Cervical back: Neck supple. No tenderness.  Lymphadenopathy:     Cervical: No cervical adenopathy.  Skin:    Findings: No erythema or rash.  Neurological:     Mental Status: She is alert.  Psychiatric:        Mood and Affect: Mood normal.        Behavior: Behavior normal.     BP 120/72   Pulse 71   Temp 98 F (36.7 C) (Oral)   Resp 16   Ht _0  (1.753 m)   Wt 275 lb (124.7 kg)   LMP 02/21/2012   SpO2 98%   BMI 40.61 kg/m  Wt Readings from Last 3 Encounters:  05/22/20 275 lb (124.7 kg)  03/28/20 274 lb (124.3 kg)  02/20/20 272 lb (123.4 kg)     Lab Results  Component Value Date   WBC 4.6 03/19/2020   HGB 14.7 03/19/2020   HCT 43.6 03/19/2020   PLT 198.0 03/19/2020   GLUCOSE 128 (H) 02/01/2020   CHOL 193 02/01/2020   TRIG 86.0 02/01/2020   HDL 71.80 02/01/2020   LDLCALC 104 (H) 02/01/2020   ALT 12 05/01/2020   AST 15 05/01/2020   NA 140 02/01/2020   K 4.1  02/01/2020   CL 106 02/01/2020   CREATININE 0.98 02/01/2020   BUN 19 02/01/2020   CO2 28 02/01/2020   TSH 1.32 06/30/2019   HGBA1C 7.1 (H) 02/01/2020   MICROALBUR <0.7 06/30/2019    US BREAST LTD UNI LEFT INC AXILLA  Result Date: 04/10/2020 CLINICAL DATA:  Patient presents for bilateral diagnostic examination as a  call back from her screening mammogram from 07/07/2019 for which patient never returned for evaluation. She comes back today to evaluate right breast microcalcifications and possible left breast mass. EXAM: DIGITAL DIAGNOSTIC BILATERAL MAMMOGRAM WITH TOMOSYNTHESIS AND CAD; ULTRASOUND LEFT BREAST LIMITED TECHNIQUE: Bilateral digital diagnostic mammography and breast tomosynthesis was performed. The images were evaluated with computer-aided detection.; Targeted ultrasound examination of the left breast was performed COMPARISON:  Previous exam(s). ACR Breast Density Category b: There are scattered areas of fibroglandular density. FINDINGS: Examination demonstrates a circumscribed lobulated 1.5 cm mass over the posterior third of the outer midportion of the left breast. This is unchanged from May 2021 on the full field images. Remainder of the left breast is unchanged. Images of the right breast demonstrate a 3 mm group of coarse heterogeneous microcalcifications over the upper outer right breast also without significant change on the full field images compared to May 2021. Remainder of the right breast is unchanged. Targeted ultrasound is performed, showing no focal abnormality over the outer midportion of the left breast to correlate to the mammographic finding. IMPRESSION: 1. Probable benign 1.5 cm mass over the outer midportion of the left breast without sonographic correlate. Stable compared to May 2021. 2. 3 mm group of probable benign microcalcifications over the upper outer right breast. Stable compared to May 2021. RECOMMENDATION: Recommend short interval follow-up bilateral diagnostic  mammogram at the time of patient's annual bilateral mammogram in May 2022 to document continued stability. I have discussed the findings and recommendations with the patient. If applicable, a reminder letter will be sent to the patient regarding the next appointment. BI-RADS CATEGORY  3: Probably benign. Electronically Signed   By: Marin Olp M.D.   On: 04/10/2020 16:27   MM DIAG BREAST TOMO BILATERAL  Result Date: 04/10/2020 CLINICAL DATA:  Patient presents for bilateral diagnostic examination as a call back from her screening mammogram from 07/07/2019 for which patient never returned for evaluation. She comes back today to evaluate right breast microcalcifications and possible left breast mass. EXAM: DIGITAL DIAGNOSTIC BILATERAL MAMMOGRAM WITH TOMOSYNTHESIS AND CAD; ULTRASOUND LEFT BREAST LIMITED TECHNIQUE: Bilateral digital diagnostic mammography and breast tomosynthesis was performed. The images were evaluated with computer-aided detection.; Targeted ultrasound examination of the left breast was performed COMPARISON:  Previous exam(s). ACR Breast Density Category b: There are scattered areas of fibroglandular density. FINDINGS: Examination demonstrates a circumscribed lobulated 1.5 cm mass over the posterior third of the outer midportion of the left breast. This is unchanged from May 2021 on the full field images. Remainder of the left breast is unchanged. Images of the right breast demonstrate a 3 mm group of coarse heterogeneous microcalcifications over the upper outer right breast also without significant change on the full field images compared to May 2021. Remainder of the right breast is unchanged. Targeted ultrasound is performed, showing no focal abnormality over the outer midportion of the left breast to correlate to the mammographic finding. IMPRESSION: 1. Probable benign 1.5 cm mass over the outer midportion of the left breast without sonographic correlate. Stable compared to May 2021. 2. 3 mm  group of probable benign microcalcifications over the upper outer right breast. Stable compared to May 2021. RECOMMENDATION: Recommend short interval follow-up bilateral diagnostic mammogram at the time of patient's annual bilateral mammogram in May 2022 to document continued stability. I have discussed the findings and recommendations with the patient. If applicable, a reminder letter will be sent to the patient regarding the next appointment. BI-RADS CATEGORY  3: Probably benign.  Electronically Signed   By: Marin Olp M.D.   On: 04/10/2020 16:27       Assessment & Plan:   Problem List Items Addressed This Visit    Essential hypertension, benign    Continue lisinopril/hctz and metoprolol.  Blood pressure doing well.  Follow pressures.  Follow metabolic panel.       Relevant Orders   CBC with Differential/Platelet   TSH   Basic metabolic panel   Hypercholesteremia    Continue crestor.  Low cholesterol diet and exercise.  Follow lipid panel and liver function tests.        Relevant Orders   Hepatic function panel   Lipid panel   Myasthenia gravis ([place])    Stable.       SVT (supraventricular tachycardia) ([place])    Recently evaluated by cardiology as outlined.  holter monitor as outlined.  Continue metoprolol.  Doing better.  Follow.       Type 2 diabetes mellitus with hyperglycemia (HCC)    Low carb diet and exercise - given elevated sugars.  Continue on metformin and jardiance.  Follow met b and a1c.        Relevant Orders   Hemoglobin A1c       Einar Pheasant, MD

## 2020-05-27 ENCOUNTER — Encounter: Payer: Self-pay | Admitting: Internal Medicine

## 2020-05-27 NOTE — Assessment & Plan Note (Signed)
Recently evaluated by cardiology as outlined.  holter monitor as outlined.  Continue metoprolol.  Doing better.  Follow.

## 2020-05-27 NOTE — Assessment & Plan Note (Signed)
Low carb diet and exercise - given elevated sugars.  Continue on metformin and jardiance.  Follow met b and a1c.

## 2020-05-27 NOTE — Assessment & Plan Note (Signed)
Stable

## 2020-05-27 NOTE — Assessment & Plan Note (Signed)
Continue crestor.  Low cholesterol diet and exercise. Follow lipid panel and liver function tests.   

## 2020-05-27 NOTE — Assessment & Plan Note (Signed)
Continue lisinopril/hctz and metoprolol.  Blood pressure doing well.  Follow pressures.  Follow metabolic panel.  

## 2020-06-09 ENCOUNTER — Other Ambulatory Visit: Payer: Self-pay | Admitting: Internal Medicine

## 2020-06-09 DIAGNOSIS — E1165 Type 2 diabetes mellitus with hyperglycemia: Secondary | ICD-10-CM

## 2020-06-19 ENCOUNTER — Other Ambulatory Visit (INDEPENDENT_AMBULATORY_CARE_PROVIDER_SITE_OTHER): Payer: 59

## 2020-06-19 ENCOUNTER — Other Ambulatory Visit: Payer: Self-pay

## 2020-06-19 DIAGNOSIS — E1165 Type 2 diabetes mellitus with hyperglycemia: Secondary | ICD-10-CM | POA: Diagnosis not present

## 2020-06-19 DIAGNOSIS — I10 Essential (primary) hypertension: Secondary | ICD-10-CM | POA: Diagnosis not present

## 2020-06-19 DIAGNOSIS — E7800 Pure hypercholesterolemia, unspecified: Secondary | ICD-10-CM | POA: Diagnosis not present

## 2020-06-19 LAB — HEPATIC FUNCTION PANEL
ALT: 11 U/L (ref 0–35)
AST: 12 U/L (ref 0–37)
Albumin: 4.2 g/dL (ref 3.5–5.2)
Alkaline Phosphatase: 50 U/L (ref 39–117)
Bilirubin, Direct: 0.1 mg/dL (ref 0.0–0.3)
Total Bilirubin: 0.4 mg/dL (ref 0.2–1.2)
Total Protein: 7.1 g/dL (ref 6.0–8.3)

## 2020-06-19 LAB — BASIC METABOLIC PANEL
BUN: 21 mg/dL (ref 6–23)
CO2: 28 mEq/L (ref 19–32)
Calcium: 9.2 mg/dL (ref 8.4–10.5)
Chloride: 103 mEq/L (ref 96–112)
Creatinine, Ser: 0.9 mg/dL (ref 0.40–1.20)
GFR: 67.84 mL/min (ref >60.00)
Glucose, Bld: 287 mg/dL — ABNORMAL HIGH (ref 70–99)
Potassium: 4.2 mEq/L (ref 3.5–5.1)
Sodium: 138 mEq/L (ref 135–145)

## 2020-06-19 LAB — CBC WITH DIFFERENTIAL/PLATELET
Basophils Absolute: 0 10*3/uL (ref 0.0–0.1)
Basophils Relative: 0.7 % (ref 0.0–3.0)
Eosinophils Absolute: 0.1 10*3/uL (ref 0.0–0.7)
Eosinophils Relative: 2.4 % (ref 0.0–5.0)
HCT: 38.4 % (ref 36.0–46.0)
Hemoglobin: 13.1 g/dL (ref 12.0–15.0)
Lymphocytes Relative: 53.2 % — ABNORMAL HIGH (ref 12.0–46.0)
Lymphs Abs: 1.7 10*3/uL (ref 0.7–4.0)
MCHC: 34 g/dL (ref 30.0–36.0)
MCV: 90.1 fl (ref 78.0–100.0)
Monocytes Absolute: 0.3 10*3/uL (ref 0.1–1.0)
Monocytes Relative: 10.6 % (ref 3.0–12.0)
Neutro Abs: 1.1 10*3/uL — ABNORMAL LOW (ref 1.4–7.7)
Neutrophils Relative %: 33.1 % — ABNORMAL LOW (ref 43.0–77.0)
Platelets: 150 10*3/uL (ref 150.0–400.0)
RBC: 4.27 Mil/uL (ref 3.87–5.11)
RDW: 13.6 % (ref 11.5–15.5)
WBC: 3.2 10*3/uL — ABNORMAL LOW (ref 4.0–10.5)

## 2020-06-19 LAB — LIPID PANEL
Cholesterol: 133 mg/dL (ref 0–200)
HDL: 67.6 mg/dL (ref >39.00)
LDL Cholesterol: 53 mg/dL (ref 0–99)
NonHDL: 65.47
Total CHOL/HDL Ratio: 2
Triglycerides: 62 mg/dL (ref 0.0–149.0)
VLDL: 12.4 mg/dL (ref 0.0–40.0)

## 2020-06-19 LAB — HEMOGLOBIN A1C: Hgb A1c MFr Bld: 10.1 % — ABNORMAL HIGH (ref 4.6–6.5)

## 2020-06-19 LAB — TSH: TSH: 2.22 u[IU]/mL (ref 0.35–4.50)

## 2020-07-10 ENCOUNTER — Other Ambulatory Visit: Payer: 59

## 2020-07-11 ENCOUNTER — Other Ambulatory Visit: Payer: Self-pay | Admitting: Internal Medicine

## 2020-07-11 ENCOUNTER — Ambulatory Visit: Payer: 59 | Admitting: Pharmacist

## 2020-07-11 DIAGNOSIS — I471 Supraventricular tachycardia: Secondary | ICD-10-CM

## 2020-07-11 DIAGNOSIS — E7800 Pure hypercholesterolemia, unspecified: Secondary | ICD-10-CM

## 2020-07-11 DIAGNOSIS — I10 Essential (primary) hypertension: Secondary | ICD-10-CM

## 2020-07-11 DIAGNOSIS — E1165 Type 2 diabetes mellitus with hyperglycemia: Secondary | ICD-10-CM

## 2020-07-11 MED ORDER — FREESTYLE LIBRE 2 SENSOR MISC: 11 refills | Status: DC

## 2020-07-11 MED ORDER — GLIPIZIDE 5 MG PO TABS: 5.0000 mg | ORAL_TABLET | Freq: Every day | ORAL | 1 refills | Status: DC

## 2020-07-11 NOTE — Chronic Care Management (AMB) (Signed)
Care Management   Pharmacy Note  07/11/2020 Name: Amber Decker MRN: [id number] DOB: [birth year]  Subjective: Amber Decker is a 65 y.o. year old female who is a primary care patient of Amber Adel, MD. The Care Management team was consulted for assistance with care management and care coordination needs.    Engaged with patient by telephone for follow up visit in response to provider referral for pharmacy case management and/or care coordination services.   The patient was given information about Care Management services today including:  1. Care Management services includes personalized support from designated clinical staff supervised by the patient's primary care provider, including individualized plan of care and coordination with other care providers. 2. 24/7 contact phone numbers for assistance for urgent and routine care needs. 3. The patient may stop case management services at any time by phone call to the office staff.  Patient agreed to services and consent obtained.  Assessment:  Review of patient status, including review of consultants reports, laboratory and other test data, was performed as part of comprehensive evaluation and provision of chronic care management services.   SDOH (Social Determinants of Health) assessments and interventions performed:  SDOH Interventions   Flowsheet Row Most Recent Value  SDOH Interventions   Financial Strain Interventions Other (Comment)  [high deductible health plan]       Objective:  Lab Results  Component Value Date   CREATININE 0.90 06/19/2020   CREATININE 0.98 02/01/2020   CREATININE 1.09 06/30/2019    Lab Results  Component Value Date   HGBA1C 10.1 (H) 06/19/2020       Component Value Date/Time   CHOL 133 06/19/2020 0920   TRIG 62.0 06/19/2020 0920   HDL 67.60 06/19/2020 0920   CHOLHDL 2 06/19/2020 0920   VLDL 12.4 06/19/2020 0920   LDLCALC 53 06/19/2020 0920    Clinical ASCVD: No  The 10-year ASCVD  risk score Amber Decker DC Jr., et al., 2013) is: 19.4%   Values used to calculate the score:     Age: 74 years     Sex: Female     Is Non-Hispanic African American: Yes     Diabetic: Yes     Tobacco smoker: Yes     Systolic Blood Pressure: 120 mmHg     Is BP treated: Yes     HDL Cholesterol: 67.6 mg/dL     Total Cholesterol: 133 mg/dL    BP Readings from Last 3 Encounters:  05/22/20 120/72  03/28/20 122/70  02/20/20 120/68    Care Plan  Allergies  Allergen Reactions  . Erythromycin Hives    Medications Reviewed Today    Reviewed by Lourena Simmonds, RPH-CPP (Pharmacist) on 07/11/20 at 1610  Med List Status: <None>  Medication Order Taking? Sig Documenting Provider Last Dose Status Informant  acyclovir (ZOVIRAX) 400 MG tablet [id number] Yes TAKE 1 TABLET (400 MG TOTAL) DAILY BY MOUTH. Amber [place], MD Taking Active   aspirin EC 81 MG tablet [id number] Yes Take 81 mg by mouth daily. [provider] Taking Active   Docusate Calcium (STOOL SOFTENER PO) [id number] No Take 400 mg by mouth daily as needed.   Patient not taking: Reported on 07/11/2020   [provider] Not Taking Active   lisinopril-hydrochlorothiazide (ZESTORETIC) 10-12.5 MG tablet [id number] Yes TAKE 1 TABLET BY MOUTH EVERY DAY Amber [place], MD Taking Active   metFORMIN (GLUCOPHAGE-XR) 500 MG 24 hr tablet [id number] Yes TAKE 2 TABLETS BY MOUTH IN THE MORNING AND 2 TABLETS AT  BEDTIME Amber [place], MD Taking Active   metoprolol tartrate (LOPRESSOR) 25 MG tablet [id number] Yes Take 1 tablet by mouth twice daily Amber [place], MD Taking Active   Multiple Vitamins-Minerals (ALIVE WOMENS 50+ PO) [id number] Yes Take by mouth every other day.  [provider] Taking Active   Omega-3 Fatty Acids (FISH OIL) 500 MG CAPS [id number] No Take 1 capsule by mouth daily.   Patient not taking: Reported on 07/11/2020   [provider] Not Taking Active   Probiotic Product (PROBIOTIC DAILY PO) [id number] No  Take 1 capsule by mouth daily.  Patient not taking: Reported on 07/11/2020   [provider] Not Taking Active   rosuvastatin (CRESTOR) 5 MG tablet [id number] Yes Take 1 tablet (5 mg total) by mouth daily. Amber [place], MD Taking Active           Patient Active Problem List   Diagnosis Date Noted  . Hypercholesteremia 02/26/2020  . Constipation 11/06/2019  . Cracked skin on feet 07/03/2019  . Abnormal mammogram 12/06/2017  . Colon cancer screening 12/06/2017  . Leukopenia 08/08/2017  . Mass of upper outer quadrant of left breast 01/15/2017  . BMI 40.0-44.9, adult (HCC) 07/24/2016  . 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 Health care maintenance 11/18/2014  . Shoulder pain 06/01/2013  . GERD (gastroesophageal reflux disease) 02/20/2013  . SVT (supraventricular tachycardia) (HCC) 03/25/2012  . Essential hypertension, benign 03/25/2012  . Type 2 diabetes mellitus with hyperglycemia (HCC) 03/25/2012  . Myasthenia gravis (HCC) 03/25/2012    Conditions to be addressed/monitored: HTN, HLD and DMII  Care Plan : Medication Management  Updates made by Lourena Simmonds, RPH-CPP since 07/11/2020 12:00 AM    Problem: Diabetes     Long-Range Goal: Disease Progression Prevention   This Visit's Progress: On track  Recent Progress: On track  Priority: High  Note:   Current Barriers:  . Unable to independently afford treatment regimen . Unable to achieve control of diabetes   Pharmacist Clinical Goal(s):  [name] Over the next 90 days, patient will verbalize ability to afford treatment regimen. . Over the next 90 days, patient will achieve control of diabetes s evidenced by improvement in A1c through collaboration with PharmD and provider  Interventions: . 1:1 collaboration with Amber [place], MD regarding development and update of comprehensive plan of care as evidenced by provider attestation and co-signature . Inter-disciplinary care team collaboration (see longitudinal plan of  care) . Comprehensive medication review performed; medication list updated in electronic medical record  Diabetes: . Uncontrolled; current treatment: metformin XR 1000 mg BID o Hx SGLT2 - patient tolerated, but copay is too high on insurance due to high deductible health plan . Current glucose readings: has not been checking lately . Discussed with high deductible plan, SGLT2 and GLP1s would not be accessible. Discussed addition of glipizide to largest meal. Counseled on risk of hypoglycemia, risk of weight gain. Patient amenable. Start glipizide 5 mg daily with supper. Continue metformin XR 1000 mg BID . Discussed benefit of CGM to assist in more extensive monitoring of glucose readings to allow for intensive adjustment of diet. Patient interested. Sent script for CGM. Libre 2 sensors are covered on her [employer] for $62/month. Will give patient 2 sample sensors to trial for a month prior to paying copay at the pharmacy. Scheduled nurse visit next week for CGM education  Hypertension, hx SVT . Controlled per last office viist; current treatment: losartan/HCTZ 100/12.5 mg daily, metoprolol tartrate 25 mg BID . Advised  to limit caffeine intake to prevent palpitations . Recommended to continue current regimen .   Hyperlipidemia and ASCVD risk reduction: . Controlled; current treatment: rosuvastatin 5 mg daily  . Antiplatelet regimen: aspirin 81 mg daily . Praised for attainment of goal LDL. Continue current regimen at this time  Patient Goals/Self-Care Activities . Over the next 90 days, patient will:  - take medications as prescribed check blood glucose BID, document, and provide at future appointments collaborate with provider on medication access solutions  Follow Up Plan: Telephone follow up appointment with care management team member scheduled for: ~ 4 weeks      Medication Assistance:  no patient assistance options for high deductible health plan  Follow Up:  Patient  agrees to Care Plan and Follow-up.  Plan: Telephone follow up appointment with care management team member scheduled for:  ~ 4 weeks  [name] Feliz Beam, PharmD, [place], CPP Clinical Pharmacist [facility] [employer] [phone]

## 2020-07-11 NOTE — Patient Instructions (Signed)
Visit Information  Goals Addressed              This Visit's Progress     Patient Stated   .  Medication Monitoring (pt-stated)        Patient Goals/Self-Care Activities . Over the next 90 days, patient will:  - take medications as prescribed check blood glucose three times daily using CGM, document, and provide at future appointments collaborate with provider on medication access solutions       Patient verbalizes understanding of instructions provided today and agrees to view in MyChart.   Plan: Telephone follow up appointment with care management team member scheduled for:  ~ 4 weeks  [name] Feliz Beam, PharmD, [place], CPP Clinical Pharmacist [facility] [employer] [phone]

## 2020-07-17 ENCOUNTER — Other Ambulatory Visit: Payer: Self-pay

## 2020-07-17 ENCOUNTER — Ambulatory Visit (INDEPENDENT_AMBULATORY_CARE_PROVIDER_SITE_OTHER): Payer: 59

## 2020-07-17 DIAGNOSIS — E1165 Type 2 diabetes mellitus with hyperglycemia: Secondary | ICD-10-CM

## 2020-07-17 NOTE — Progress Notes (Signed)
Patient presented for [facility] placement and instruction. Per Providers order from 07-11-20. Patient voiced no concerns nor showed signs of distress during placement. Also instructed patient on how to record and give access to [place] view so we can have accurate monitoring system. Instructed patient that if further questions, they can call clinic to be given further direction.

## 2020-07-23 ENCOUNTER — Other Ambulatory Visit: Payer: Self-pay | Admitting: Internal Medicine

## 2020-08-07 ENCOUNTER — Ambulatory Visit: Payer: 59 | Admitting: Pharmacist

## 2020-08-07 DIAGNOSIS — G7000 Myasthenia gravis without (acute) exacerbation: Secondary | ICD-10-CM

## 2020-08-07 DIAGNOSIS — I10 Essential (primary) hypertension: Secondary | ICD-10-CM

## 2020-08-07 DIAGNOSIS — E1165 Type 2 diabetes mellitus with hyperglycemia: Secondary | ICD-10-CM

## 2020-08-07 MED ORDER — STEGLATRO 5 MG PO TABS: 5.0000 mg | ORAL_TABLET | Freq: Every day | ORAL | 2 refills | Status: DC

## 2020-08-07 MED ORDER — RYBELSUS 3 MG PO TABS: 3.0000 mg | ORAL_TABLET | Freq: Every day | ORAL | 2 refills | Status: DC

## 2020-08-08 ENCOUNTER — Telehealth: Payer: 59

## 2020-08-08 NOTE — Patient Instructions (Signed)
Visit Information  Goals Addressed              This Visit's Progress     Patient Stated   .  Medication Monitoring (pt-stated)        Patient Goals/Self-Care Activities . Over the next 90 days, patient will:  - take medications as prescribed check blood glucose three times daily using CGM, document, and provide at future appointments collaborate with provider on medication access solutions       Patient verbalizes understanding of instructions provided today and agrees to view in MyChart.   Plan: Telephone follow up appointment with care management team member scheduled for:  ~ 4 weeks  [name] Johnnie Goynes, PharmD, BCACP, CPP Clinical Pharmacist [facility] [place] [phone] 

## 2020-08-08 NOTE — Chronic Care Management (AMB) (Signed)
Care Management   Pharmacy Note  08/08/2020 Name: Amber Decker MRN: [id number] DOB: May 24, 1955  Subjective: Amber Decker is a 65 y.o. year old female who is a primary care patient of Dale [place], MD. The Care Management team was consulted for assistance with care management and care coordination needs.    Engaged with patient by telephone for follow up visit in response to provider referral for pharmacy case management and/or care coordination services.   The patient was given information about Care Management services today including:  Care Management services includes personalized support from designated clinical staff supervised by the patient's primary care provider, including individualized plan of care and coordination with other care providers. 24/7 contact phone numbers for assistance for urgent and routine care needs. The patient may stop case management services at any time by phone call to the office staff.  Patient agreed to services and consent obtained.  Assessment:  Review of patient status, including review of consultants reports, laboratory and other test data, was performed as part of comprehensive evaluation and provision of chronic care management services.   SDOH (Social Determinants of Health) assessments and interventions performed:    Objective:  Lab Results  Component Value Date   CREATININE 0.90 06/19/2020   CREATININE 0.98 02/01/2020   CREATININE 1.09 06/30/2019    Lab Results  Component Value Date   HGBA1C 10.1 (H) 06/19/2020       Component Value Date/Time   CHOL 133 06/19/2020 0920   TRIG 62.0 06/19/2020 0920   HDL 67.60 06/19/2020 0920   CHOLHDL 2 06/19/2020 0920   VLDL 12.4 06/19/2020 0920   LDLCALC 53 06/19/2020 0920    Clinical ASCVD: No  The 10-year ASCVD risk score Denman George DC Jr., et al., 2013) is: 20.9%   Values used to calculate the score:     Age: 65 years     Sex: Female     Is Non-Hispanic African American: Yes      Diabetic: Yes     Tobacco smoker: Yes     Systolic Blood Pressure: 120 mmHg     Is BP treated: Yes     HDL Cholesterol: 67.6 mg/dL     Total Cholesterol: 133 mg/dL     BP Readings from Last 3 Encounters:  05/22/20 120/72  03/28/20 122/70  02/20/20 120/68    Care Plan  Allergies  Allergen Reactions   Erythromycin Hives    Medications Reviewed Today     Reviewed by Lourena Simmonds, RPH-CPP (Pharmacist) on 07/11/20 at 1610  Med List Status: <None>   Medication Order Taking? Sig Documenting Provider Last Dose Status Informant  acyclovir (ZOVIRAX) 400 MG tablet [id number] Yes TAKE 1 TABLET (400 MG TOTAL) DAILY BY MOUTH. Dale [place], MD Taking Active   aspirin EC 81 MG tablet [id number] Yes Take 81 mg by mouth daily. [provider] Taking Active   Docusate Calcium (STOOL SOFTENER PO) [id number] No Take 400 mg by mouth daily as needed.   Patient not taking: Reported on 07/11/2020   [provider] Not Taking Active   lisinopril-hydrochlorothiazide (ZESTORETIC) 10-12.5 MG tablet [id number] Yes TAKE 1 TABLET BY MOUTH EVERY DAY Dale [place], MD Taking Active   metFORMIN (GLUCOPHAGE-XR) 500 MG 24 hr tablet [id number] Yes TAKE 2 TABLETS BY MOUTH IN THE MORNING AND 2 TABLETS AT BEDTIME Dale [place], MD Taking Active   metoprolol tartrate (LOPRESSOR) 25 MG tablet [id number] Yes Take 1 tablet by mouth twice daily Dale [place], MD Taking Active  Multiple Vitamins-Minerals (ALIVE WOMENS 50+ PO) [id number] Yes Take by mouth every other day.  [provider] Taking Active   Omega-3 Fatty Acids (FISH OIL) 500 MG CAPS [id number] No Take 1 capsule by mouth daily.   Patient not taking: Reported on 07/11/2020   [provider] Not Taking Active   Probiotic Product (PROBIOTIC DAILY PO) [id number] No Take 1 capsule by mouth daily.  Patient not taking: Reported on 07/11/2020   [provider] Not Taking Active   rosuvastatin (CRESTOR) 5 MG tablet  [id number] Yes Take 1 tablet (5 mg total) by mouth daily. 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   Health care maintenance 11/18/2014   Shoulder pain 06/01/2013   GERD (gastroesophageal reflux disease) 02/20/2013   SVT (supraventricular tachycardia) (HCC) 03/25/2012   Essential hypertension, benign 03/25/2012   Type 2 diabetes mellitus with hyperglycemia (HCC) 03/25/2012   Myasthenia gravis (HCC) 03/25/2012    Conditions to be addressed/monitored: HTN and DMII  Care Plan : Medication Management  Updates made by Lourena Simmonds, RPH-CPP since 08/08/2020 12:00 AM     Problem: Diabetes      Long-Range Goal: Disease Progression Prevention   This Visit's Progress: On track  Recent Progress: On track  Priority: High  Note:   Current Barriers:  Unable to independently afford treatment regimen Unable to achieve control of diabetes   Pharmacist Clinical Goal(s):  Over the next 90 days, patient will verbalize ability to afford treatment regimen. Over the next 90 days, patient will achieve control of diabetes s evidenced by improvement in A1c through collaboration with PharmD and provider  Interventions: 1:1 collaboration with Dale [place], MD regarding development and update of comprehensive plan of care as evidenced by provider attestation and co-signature Inter-disciplinary care team collaboration (see longitudinal plan of care) Comprehensive medication review performed; medication list updated in electronic medical record  Diabetes: Uncontrolled; current treatment: metformin XR 1000 mg BID, glipizide 5  mg before supper Hx SGLT2 - patient tolerated, but copay was too high on insurance due to high deductible health plan Current glucose readings: using Libre 2 CGM Date of Download: 6/8-6/21/22 % Time CGM is active: 67% Average Glucose: 223 mg/dL Glucose Management Indicator: 8.6  Glucose Variability: 17.3 (goal <36%) Time in Goal:  - Time in range 70-180: 12% - Time above range: 88% - Time below range: none Elevated fasting and post prandial readings. Patient notes that taking glipizide right before her supper has been difficult to fit into her routine Counseled on GLP1 agonists, including mechanism of action, side effects, and benefits. No personal or family history of medullary thyroid cancer. Counseled on potential side effects of nausea, stomach upset, queasiness, constipation, and that these generally improve over time. Advised to contact our office with more severe symptoms, including nausea, diarrhea, stomach pain. Patient verbalized understanding. Rybelsus may be affordable given higher monthly max of $300. Sent Rybelsus script to the pharmacy. Contacted pharmacy to determine copay. Unfortunately, copay on her insurance is $800, so even with savings card, copay would be $500 Retrying [employer] savings card, as  the monthly maximum is $583 per month. Sent script to pharmacy and emailed patient savings card from [employer]. Contacted pharmacy, required prior authorization. Completed PA via Cover My Meds.   Hypertension, hx SVT Controlled per last office viist; current treatment: losartan/HCTZ 100/12.5 mg daily, metoprolol tartrate 25 mg BID Advised to limit caffeine intake to prevent palpitations Recommended to continue current regimen .   Hyperlipidemia and ASCVD risk reduction: Controlled; current treatment: rosuvastatin 5 mg daily  Antiplatelet regimen: aspirin 81 mg daily Praised for attainment of goal LDL. Continue current regimen at this time  Patient Goals/Self-Care  Activities Over the next 90 days, patient will:  - take medications as prescribed check blood glucose BID, document, and provide at future appointments collaborate with provider on medication access solutions  Follow Up Plan: Telephone follow up appointment with care management team member scheduled for: ~ 4 weeks      Medication Assistance:   High deductible insurance plan  Follow Up:  Patient agrees to Care Plan and Follow-up.  Plan: Telephone follow up appointment with care management team member scheduled for:  ~ 4 weeks  [name] Feliz Beam, PharmD, [place], CPP Clinical Pharmacist [facility] at [employer] [phone]

## 2020-08-10 ENCOUNTER — Ambulatory Visit: Payer: 59 | Admitting: Pharmacist

## 2020-08-10 DIAGNOSIS — E1165 Type 2 diabetes mellitus with hyperglycemia: Secondary | ICD-10-CM

## 2020-08-10 DIAGNOSIS — I10 Essential (primary) hypertension: Secondary | ICD-10-CM

## 2020-08-10 NOTE — Chronic Care Management (AMB) (Signed)
Care Management   Pharmacy Note  08/10/2020 Name: Amber Decker MRN: [id number] DOB: 1956/01/08  Subjective: Amber Decker is a 65 y.o. year old female who is a primary care patient of Dale Prosper, MD. The Care Management team was consulted for assistance with care management and care coordination needs.    Engaged with patient by telephone for  medication access f/u  in response to provider referral for pharmacy case management and/or care coordination services.   The patient was given information about Care Management services today including:  Care Management services includes personalized support from designated clinical staff supervised by the patient's primary care provider, including individualized plan of care and coordination with other care providers. 24/7 contact phone numbers for assistance for urgent and routine care needs. The patient may stop case management services at any time by phone call to the office staff.  Patient agreed to services and consent obtained.  Assessment:  Review of patient status, including review of consultants reports, laboratory and other test data, was performed as part of comprehensive evaluation and provision of chronic care management services.   SDOH (Social Determinants of Health) assessments and interventions performed:  medication access  Objective:  Lab Results  Component Value Date   CREATININE 0.90 06/19/2020   CREATININE 0.98 02/01/2020   CREATININE 1.09 06/30/2019    Lab Results  Component Value Date   HGBA1C 10.1 (H) 06/19/2020       Component Value Date/Time   CHOL 133 06/19/2020 0920   TRIG 62.0 06/19/2020 0920   HDL 67.60 06/19/2020 0920   CHOLHDL 2 06/19/2020 0920   VLDL 12.4 06/19/2020 0920   LDLCALC 53 06/19/2020 0920      Clinical ASCVD: No  The 10-year ASCVD risk score Denman George DC Jr., et al., 2013) is: 11.1%   Values used to calculate the score:     Age: 62 years     Sex: Female     Is Non-Hispanic  African American: Yes     Diabetic: Yes     Tobacco smoker: No     Systolic Blood Pressure: 120 mmHg     Is BP treated: Yes     HDL Cholesterol: 67.6 mg/dL     Total Cholesterol: 133 mg/dL      BP Readings from Last 3 Encounters:  05/22/20 120/72  03/28/20 122/70  02/20/20 120/68    Care Plan  Allergies  Allergen Reactions   Erythromycin Hives    Medications Reviewed Today     Reviewed by Lourena Simmonds, RPH-CPP (Pharmacist) on 07/11/20 at 1610  Med List Status: <None>   Medication Order Taking? Sig Documenting Provider Last Dose Status Informant  acyclovir (ZOVIRAX) 400 MG tablet [id number] Yes TAKE 1 TABLET (400 MG TOTAL) DAILY BY MOUTH. Dale [place], MD Taking Active   aspirin EC 81 MG tablet [id number] Yes Take 81 mg by mouth daily. [provider] Taking Active   Docusate Calcium (STOOL SOFTENER PO) [id number] No Take 400 mg by mouth daily as needed.   Patient not taking: Reported on 07/11/2020   [provider] Not Taking Active   lisinopril-hydrochlorothiazide (ZESTORETIC) 10-12.5 MG tablet [id number] Yes TAKE 1 TABLET BY MOUTH EVERY DAY Dale [place], MD Taking Active   metFORMIN (GLUCOPHAGE-XR) 500 MG 24 hr tablet [id number] Yes TAKE 2 TABLETS BY MOUTH IN THE MORNING AND 2 TABLETS AT BEDTIME Dale [place], MD Taking Active   metoprolol tartrate (LOPRESSOR) 25 MG tablet [id number] Yes Take 1 tablet by mouth twice  daily Dale [place], MD Taking Active   Multiple Vitamins-Minerals (ALIVE WOMENS 50+ PO) [id number] Yes Take by mouth every other day.  [provider] Taking Active   Omega-3 Fatty Acids (FISH OIL) 500 MG CAPS [id number] No Take 1 capsule by mouth daily.   Patient not taking: Reported on 07/11/2020   [provider] Not Taking Active   Probiotic Product (PROBIOTIC DAILY PO) [id number] No Take 1 capsule by mouth daily.  Patient not taking: Reported on 07/11/2020   [provider] Not Taking Active   rosuvastatin  (CRESTOR) 5 MG tablet [id number] Yes Take 1 tablet (5 mg total) by mouth daily. 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   Health care maintenance 11/18/2014   Shoulder pain 06/01/2013   GERD (gastroesophageal reflux disease) 02/20/2013   SVT (supraventricular tachycardia) (HCC) 03/25/2012   Essential hypertension, benign 03/25/2012   Type 2 diabetes mellitus with hyperglycemia (HCC) 03/25/2012   Myasthenia gravis (HCC) 03/25/2012    Conditions to be addressed/monitored: HTN, HLD, and DMII  Care Plan : Medication Management  Updates made by Lourena Simmonds, RPH-CPP since 08/10/2020 12:00 AM     Problem: Diabetes      Long-Range Goal: Disease Progression Prevention   This Visit's Progress: On track  Recent Progress: On track  Priority: High  Note:   Current Barriers:  Unable to independently afford treatment regimen Unable to achieve control of diabetes   Pharmacist Clinical Goal(s):  Over the next 90 days, patient will verbalize ability to afford treatment regimen. Over the next 90 days, patient will achieve control of diabetes s evidenced by improvement in A1c through collaboration with PharmD and provider  Interventions: 1:1 collaboration with Dale Mason, MD regarding development and update of comprehensive plan of care as evidenced by provider attestation and co-signature Inter-disciplinary care team collaboration (see longitudinal plan of care) Comprehensive medication review performed; medication list updated in electronic medical record  Diabetes: Uncontrolled; current treatment: metformin XR  1000 mg BID, glipizide 5 mg before supper Hx SGLT2 - patient tolerated, but copay was too high on insurance due to high deductible health plan Attempted Rybelsus - copay on insurance was too high, even with savings card PA for [facility] was denied. Insurance will not cover if she hasn't tried and failed other SGLT2 options.  Current glucose readings: using Libre 2 CGM Date of Download: 6/8-6/21/22 % Time CGM is active: 67% Average Glucose: 223 mg/dL Glucose Management Indicator: 8.6  Glucose Variability: 17.3 (goal <36%) Time in Goal:  - Time in range 70-180: 12% - Time above range: 88% - Time below range: none Discussed that her insurance limits use of preferred agents due to high deductible. She plans to investigate other insurance options come Open Enrollment. We also discussed that once she is on a Medicare plan, the option for Patient Assistance opens up preferred options - like Ozempic or Moujaro, as these would additionally help with weight management Discussed that elevated readings throughout the day indicate that basal insulin or pioglitazone would be the next best step. Prefer to avoid pioglitazone given  arrhythmia and risk of HF. Patient is concerned that if she starts on insulin, she will never be able to get off. Discussed that this is not necessarily true, especially if options open up with future insurance plans to be able to use GLP.  Increase glipizide to 5 mg BID, with breakfast and supper.  Patient will contact her insurance to investigate copay for preferred insulin. Will bring this information to Dr. Roby Lofts appointment in 2 weeks.   Hypertension, hx SVT Controlled per last office viist; current treatment: losartan/HCTZ 100/12.5 mg daily, metoprolol tartrate 25 mg BID Advised to limit caffeine intake to prevent palpitations Previously recommended to continue current regimen.   Hyperlipidemia and ASCVD risk reduction: Controlled; current treatment: rosuvastatin 5 mg  daily  Antiplatelet regimen: aspirin 81 mg daily Previously recommended to continue current regimen at this time  Patient Goals/Self-Care Activities Over the next 90 days, patient will:  - take medications as prescribed check blood glucose BID, document, and provide at future appointments collaborate with provider on medication access solutions  Follow Up Plan: Telephone follow up appointment with care management team member scheduled for: ~ 2 weeks      Medication Assistance:  None required.  Patient affirms current coverage meets needs.  Follow Up:  Patient agrees to Care Plan and Follow-up.  Plan: Face to Face appointment with care management team member scheduled for: 2 weeks  [name] Feliz Beam, PharmD, [place], CPP Clinical Pharmacist [facility] [employer] [facility] [phone]

## 2020-08-10 NOTE — Patient Instructions (Signed)
Visit Information   Goals Addressed               This Visit's Progress     Patient Stated     Medication Monitoring (pt-stated)        Patient Goals/Self-Care Activities Over the next 90 days, patient will:  - take medications as prescribed check blood glucose three times daily using CGM, document, and provide at future appointments collaborate with provider on medication access solutions           Patient verbalizes understanding of instructions provided today and agrees to view in MyChart.   Plan: Face to Face appointment with care management team member scheduled for: 2 weeks  [name] Feliz Beam, PharmD, [place], CPP Clinical Pharmacist [facility] [employer] [phone]

## 2020-08-23 ENCOUNTER — Encounter: Payer: Self-pay | Admitting: Internal Medicine

## 2020-08-23 ENCOUNTER — Other Ambulatory Visit: Payer: Self-pay

## 2020-08-23 ENCOUNTER — Other Ambulatory Visit (HOSPITAL_COMMUNITY)
Admission: RE | Admit: 2020-08-23 | Discharge: 2020-08-23 | Disposition: A | Discharge status: Home / Self Care | Payer: 59 | Source: Ambulatory Visit | Attending: Internal Medicine | Admitting: Internal Medicine

## 2020-08-23 ENCOUNTER — Ambulatory Visit: Payer: 59 | Admitting: Pharmacist

## 2020-08-23 ENCOUNTER — Ambulatory Visit (INDEPENDENT_AMBULATORY_CARE_PROVIDER_SITE_OTHER): Payer: 59 | Admitting: Internal Medicine

## 2020-08-23 VITALS — BP 120/68 | HR 71 | Temp 99.0°F | Ht 69.0 in | Wt 271.6 lb

## 2020-08-23 DIAGNOSIS — I10 Essential (primary) hypertension: Secondary | ICD-10-CM

## 2020-08-23 DIAGNOSIS — E7800 Pure hypercholesterolemia, unspecified: Secondary | ICD-10-CM

## 2020-08-23 DIAGNOSIS — D72819 Decreased white blood cell count, unspecified: Secondary | ICD-10-CM

## 2020-08-23 DIAGNOSIS — E1165 Type 2 diabetes mellitus with hyperglycemia: Secondary | ICD-10-CM

## 2020-08-23 DIAGNOSIS — I471 Supraventricular tachycardia: Secondary | ICD-10-CM | POA: Diagnosis not present

## 2020-08-23 DIAGNOSIS — Z0000 Encounter for general adult medical examination without abnormal findings: Secondary | ICD-10-CM | POA: Diagnosis present

## 2020-08-23 DIAGNOSIS — G7000 Myasthenia gravis without (acute) exacerbation: Secondary | ICD-10-CM

## 2020-08-23 NOTE — Chronic Care Management (AMB) (Signed)
Care Management   Pharmacy Note  08/23/2020 Name: STACHIA SLUTSKY MRN: [id number] DOB: 09/03/1955  Subjective: Amber Decker is a 65 y.o. year old female who is a primary care patient of Dale McPherson, MD. The Care Management team was consulted for assistance with care management and care coordination needs.    Care coordination with PCP  for  medication management  in response to provider referral for pharmacy case management and/or care coordination services.   The patient was given information about Care Management services today including:  Care Management services includes personalized support from designated clinical staff supervised by the patient's primary care provider, including individualized plan of care and coordination with other care providers. 24/7 contact phone numbers for assistance for urgent and routine care needs. The patient may stop case management services at any time by phone call to the office staff.  Patient agreed to services and consent obtained.  Assessment:  Review of patient status, including review of consultants reports, laboratory and other test data, was performed as part of comprehensive evaluation and provision of chronic care management services.   SDOH (Social Determinants of Health) assessments and interventions performed:    Objective:  Lab Results  Component Value Date   CREATININE 0.90 06/19/2020   CREATININE 0.98 02/01/2020   CREATININE 1.09 06/30/2019    Lab Results  Component Value Date   HGBA1C 10.1 (H) 06/19/2020       Component Value Date/Time   CHOL 133 06/19/2020 0920   TRIG 62.0 06/19/2020 0920   HDL 67.60 06/19/2020 0920   CHOLHDL 2 06/19/2020 0920   VLDL 12.4 06/19/2020 0920   LDLCALC 53 06/19/2020 0920    Clinical ASCVD: No  The 10-year ASCVD risk score Denman George DC Jr., et al., 2013) is: 10%   Values used to calculate the score:     Age: 18 years     Sex: Female     Is Non-Hispanic African American: Yes      Diabetic: Yes     Tobacco smoker: No     Systolic Blood Pressure: 115 mmHg     Is BP treated: Yes     HDL Cholesterol: 67.6 mg/dL     Total Cholesterol: 133 mg/dL      BP Readings from Last 3 Encounters:  08/23/20 115/80  05/22/20 120/72  03/28/20 122/70    Care Plan  Allergies  Allergen Reactions   Erythromycin Hives    Medications Reviewed Today     Reviewed by Allean Found, CMA (Certified Medical Assistant) on 08/23/20 at 1536  Med List Status: <None>   Medication Order Taking? Sig Documenting Provider Last Dose Status Informant  acyclovir (ZOVIRAX) 400 MG tablet [id number] Yes TAKE 1 TABLET (400 MG TOTAL) DAILY BY MOUTH. Dale [place], MD Taking Active   aspirin EC 81 MG tablet [id number] Yes Take 81 mg by mouth daily. [provider] Taking Active   Continuous Blood Gluc Sensor (FREESTYLE LIBRE 2 SENSOR) [place] [id number] Yes Use to check glucose at least three times daily Dale [place], MD Taking Active   ertugliflozin L-PyroglutamicAc (STEGLATRO) 5 MG TABS tablet [id number] Yes Take 1 tablet (5 mg total) by mouth daily. Dale [place], MD Taking Active   glipiZIDE (GLUCOTROL) 5 MG tablet [id number] Yes Take 1 tablet (5 mg total) by mouth daily before supper. Dale Roby, MD Taking Active   lisinopril-hydrochlorothiazide (ZESTORETIC) 10-12.5 MG tablet [id number] Yes TAKE 1 TABLET BY MOUTH EVERY DAY Dale [place], MD Taking Active   metFORMIN (  GLUCOPHAGE-XR) 500 MG 24 hr tablet [id number] Yes TAKE 2 TABLETS BY MOUTH IN THE MORNING AND 2 TABLETS AT BEDTIME Dale [place], MD Taking Active   metoprolol tartrate (LOPRESSOR) 25 MG tablet [id number] Yes Take 1 tablet by mouth twice daily Dale [place], MD Taking Active   Multiple Vitamins-Minerals (ALIVE WOMENS 50+ PO) [id number] Yes Take by mouth every other day.  [provider] Taking Active   rosuvastatin (CRESTOR) 5 MG tablet [id number] Yes Take 1 tablet (5 mg total) by mouth daily. 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   Health care maintenance 11/18/2014   Shoulder pain 06/01/2013   GERD (gastroesophageal reflux disease) 02/20/2013   SVT (supraventricular tachycardia) (HCC) 03/25/2012   Essential hypertension, benign 03/25/2012   Type 2 diabetes mellitus with hyperglycemia (HCC) 03/25/2012   Myasthenia gravis (HCC) 03/25/2012    Conditions to be addressed/monitored: HTN, HLD, and DMII  Care Plan : Medication Management  Updates made by Lourena Simmonds, RPH-CPP since 08/23/2020 12:00 AM     Problem: Diabetes      Long-Range Goal: Disease Progression Prevention   This Visit's Progress: On track  Recent Progress: On track  Priority: High  Note:   Current Barriers:  Unable to independently afford treatment regimen Unable to achieve control of diabetes   Pharmacist Clinical Goal(s):  Over the next 90 days, patient will verbalize ability to afford treatment regimen. Over the next 90 days, patient will achieve control of diabetes s evidenced by improvement in A1c through collaboration with PharmD and provider  Interventions: 1:1 collaboration with Dale D'Hanis, MD regarding development and update of comprehensive plan of care as evidenced by provider attestation and co-signature Inter-disciplinary care team collaboration (see longitudinal plan of care) Comprehensive medication review performed; medication list updated in electronic medical record  SDOH: High deductible insurance plan. Cannot afford SGLT2 or GLP1 due to deductible being added on to medication.   Diabetes: Uncontrolled; current  treatment: metformin XR 1000 mg BID, glipizide 5 mg BID Hx SGLT2 - patient tolerated, but copay was too high on insurance due to high deductible health plan Attempted Rybelsus - copay on insurance was too high, even with savings card PA for [facility] was denied. Insurance will not cover if she hasn't tried and failed other SGLT2 options.  Current glucose readings: using Libre 2 CGM Discussed starting basal insulin w/ PCP. Patient declines. Extensive dietary discussion. Provided [place] sample, will follow up in ~ 4 weeks.  Hypertension, hx SVT Controlled per office visit today; current treatment: losartan/HCTZ 100/12.5 mg daily, metoprolol tartrate 25 mg BID Advised to limit caffeine intake to prevent palpitations Previously recommended to continue current regimen.   Hyperlipidemia and ASCVD risk reduction: Controlled; current treatment: rosuvastatin 5 mg daily  Antiplatelet regimen: aspirin 81 mg daily Previously recommended to continue current regimen at this time  Patient Goals/Self-Care Activities Over the next 90 days, patient will:  - take medications as prescribed check blood glucose BID, document, and provide at future appointments collaborate with provider on medication access solutions  Follow Up Plan: Telephone follow up appointment  with care management team member scheduled for: ~ 4 weeks      Medication Assistance:  None required.  Patient affirms current coverage meets needs.  Follow Up:  Patient agrees to Care Plan and Follow-up.  Plan: Telephone follow up appointment with care management team member scheduled for:  ~ 4 weeks  [name] Feliz Beam, PharmD, [place], CPP Clinical Pharmacist [facility] [employer] [phone]

## 2020-08-23 NOTE — Patient Instructions (Signed)
Visit Information  Goals Addressed              This Visit's Progress     Patient Stated   .  Medication Monitoring (pt-stated)        Patient Goals/Self-Care Activities . Over the next 90 days, patient will:  - take medications as prescribed check blood glucose three times daily using CGM, document, and provide at future appointments collaborate with provider on medication access solutions       Patient verbalizes understanding of instructions provided today and agrees to view in MyChart.   Plan: Telephone follow up appointment with care management team member scheduled for:  ~ 4 weeks  [name] Cardell Rachel, PharmD, BCACP, CPP Clinical Pharmacist [facility] [place] [phone] 

## 2020-08-23 NOTE — Progress Notes (Signed)
Patient ID: Amber Decker, female   DOB: 03-Nov-1955, 65 y.o.   MRN: [id number]   Subjective:    Patient ID: Amber Decker, female    DOB: 05/26/1955, 65 y.o.   MRN: [id number]  HPI This visit occurred during the SARS-CoV-2 public health emergency.  Safety protocols were in place, including screening questions prior to the visit, additional usage of staff PPE, and extensive cleaning of exam room while observing appropriate contact time as indicated for disinfecting solutions.   Patient here for her physical.  Sugars elevated.  Discussed.  Copay too high - SGLT2.  GLP1 too expensive.  Rybelsus - copay too high.  On metformin and glipizide. Discussed persistent elevation in sugar. Discussed diet and exercise.  Discussed the need to start insulin - to get better sugar control.  Discussed starting one dose per day - long acting insulin.  She declines.  Wants to work on diet and exercise and see what she can do without insulin.  Stressed importance of better sugar control and risk of continued poorly controlled sugars.  Declines insulin and states will work on getting better control of sugar with diet and exercise.  No chest pain reported.  Breathing stable.  No increased cough or congestion.  No increased abdominal pain reported.  No bowel issues reported.  No sick contacts.  No fever.  No nausea or vomiting.  Past Medical History:  Diagnosis Date   Diabetes mellitus without complication (HCC) 2008   Herpes    Hypertension    Myasthenia gravis [facility])    onset age 15's?   SVT (supraventricular tachycardia) (HCC)    Past Surgical History:  Procedure Laterality Date   MYOMECTOMY     UTERINE FIBROID SURGERY     at [facility]   Family History  Problem Relation Age of Onset   Diabetes Mother    Alcohol abuse Father    Heart disease Father        myocardial infarction   Breast cancer Neg Hx    Colon cancer Neg Hx    Social History   Socioeconomic History   Marital status: Divorced    Spouse  name: Not on file   Number of children: 0   Years of education: Not on file   Highest education level: Not on file  Occupational History    Employer: OTHER  Tobacco Use   Smoking status: Never   Smokeless tobacco: Never  Substance and Sexual Activity   Alcohol use: Yes    Alcohol/week: 0.0 standard drinks    Comment: occasionally   Drug use: No   Sexual activity: Not on file  Other Topics Concern   Not on file  Social History Narrative   Not on file   Social Determinants of Health   Financial Resource Strain: High Risk   Difficulty of Paying Living Expenses: Hard  Food Insecurity: Not on file  Transportation Needs: Not on file  Physical Activity: Not on file  Stress: Not on file  Social Connections: Not on file    Review of Systems  Constitutional:  Negative for appetite change and unexpected weight change.  HENT:  Negative for congestion, sinus pressure and sore throat.   Eyes:  Negative for pain and visual disturbance.  Respiratory:  Negative for cough, chest tightness and shortness of breath.   Cardiovascular:  Negative for chest pain and palpitations.  Gastrointestinal:  Negative for abdominal pain, diarrhea, nausea and vomiting.  Genitourinary:  Negative for difficulty urinating and dysuria.  Musculoskeletal:  Negative for joint swelling and myalgias.  Skin:  Negative for color change and rash.  Neurological:  Negative for dizziness, light-headedness and headaches.  Hematological:  Negative for adenopathy. Does not bruise/bleed easily.  Psychiatric/Behavioral:  Negative for agitation and dysphoric mood.       Objective:    Physical Exam Vitals reviewed.  Constitutional:      General: She is not in acute distress.    Appearance: Normal appearance. She is well-developed.  HENT:     Head: Normocephalic and atraumatic.     Right Ear: External ear normal.     Left Ear: External ear normal.  Eyes:     General: No scleral icterus.       Right eye: No  discharge.        Left eye: No discharge.     Conjunctiva/sclera: Conjunctivae normal.  Neck:     Thyroid: No thyromegaly.  Cardiovascular:     Rate and Rhythm: Normal rate and regular rhythm.  Pulmonary:     Effort: No tachypnea, accessory muscle usage or respiratory distress.     Breath sounds: Normal breath sounds. No decreased breath sounds or wheezing.  Chest:  Breasts:    Right: No inverted nipple, mass, nipple discharge or tenderness (no axillary adenopathy).     Left: No inverted nipple, mass, nipple discharge or tenderness (no axilarry adenopathy).  Abdominal:     General: Bowel sounds are normal.     Palpations: Abdomen is soft.     Tenderness: There is no abdominal tenderness.  Genitourinary:    Comments: Normal external genitalia.  Vaginal vault without lesions.  Cervix identified.  Pap smear performed.  Could not appreciate any adnexal masses or tenderness.   Musculoskeletal:        General: No swelling or tenderness.     Cervical back: Neck supple.  Lymphadenopathy:     Cervical: No cervical adenopathy.  Skin:    Findings: No erythema or rash.  Neurological:     Mental Status: She is alert and oriented to person, place, and time.  Psychiatric:        Mood and Affect: Mood normal.        Behavior: Behavior normal.    BP 120/68   Pulse 71   Temp 99 F (37.2 C) (Oral)   Ht 5\' 9"  (1.753 m)   Wt 271 lb 9.6 oz (123.2 kg)   LMP 02/21/2012   SpO2 99%   BMI 40.11 kg/m  Wt Readings from Last 3 Encounters:  08/23/20 271 lb 9.6 oz (123.2 kg)  05/22/20 275 lb (124.7 kg)  03/28/20 274 lb (124.3 kg)    Outpatient Encounter Medications as of 08/23/2020  Medication Sig   acyclovir (ZOVIRAX) 400 MG tablet TAKE 1 TABLET (400 MG TOTAL) DAILY BY MOUTH.   aspirin EC 81 MG tablet Take 81 mg by mouth daily.   Continuous Blood Gluc Sensor (FREESTYLE LIBRE 2 SENSOR) MISC Use to check glucose at least three times daily   ertugliflozin L-PyroglutamicAc (STEGLATRO) 5 MG TABS  tablet Take 1 tablet (5 mg total) by mouth daily.   glipiZIDE (GLUCOTROL) 5 MG tablet Take 1 tablet (5 mg total) by mouth daily before supper.   lisinopril-hydrochlorothiazide (ZESTORETIC) 10-12.5 MG tablet TAKE 1 TABLET BY MOUTH EVERY DAY   metFORMIN (GLUCOPHAGE-XR) 500 MG 24 hr tablet TAKE 2 TABLETS BY MOUTH IN THE MORNING AND 2 TABLETS AT BEDTIME   metoprolol tartrate (LOPRESSOR) 25 MG tablet Take 1 tablet by  mouth twice daily   Multiple Vitamins-Minerals (ALIVE WOMENS 50+ PO) Take by mouth every other day.    rosuvastatin (CRESTOR) 5 MG tablet Take 1 tablet (5 mg total) by mouth daily.   [DISCONTINUED] Docusate Calcium (STOOL SOFTENER PO) Take 400 mg by mouth daily as needed.  (Patient not taking: Reported on 07/11/2020)   [DISCONTINUED] Omega-3 Fatty Acids (FISH OIL) 500 MG CAPS Take 1 capsule by mouth daily.  (Patient not taking: Reported on 07/11/2020)   [DISCONTINUED] Probiotic Product (PROBIOTIC DAILY PO) Take 1 capsule by mouth daily. (Patient not taking: Reported on 07/11/2020)   No facility-administered encounter medications on file as of 08/23/2020.     Lab Results  Component Value Date   WBC 3.2 (L) 06/19/2020   HGB 13.1 06/19/2020   HCT 38.4 06/19/2020   PLT 150.0 06/19/2020   GLUCOSE 287 (H) 06/19/2020   CHOL 133 06/19/2020   TRIG 62.0 06/19/2020   HDL 67.60 06/19/2020   LDLCALC 53 06/19/2020   ALT 11 06/19/2020   AST 12 06/19/2020   NA 138 06/19/2020   K 4.2 06/19/2020   CL 103 06/19/2020   CREATININE 0.90 06/19/2020   BUN 21 06/19/2020   CO2 28 06/19/2020   TSH 2.22 06/19/2020   HGBA1C 10.1 (H) 06/19/2020   MICROALBUR <0.7 06/30/2019    US BREAST LTD UNI LEFT INC AXILLA  Result Date: 04/10/2020 CLINICAL DATA:  Patient presents for bilateral diagnostic examination as a call back from her screening mammogram from 07/07/2019 for which patient never returned for evaluation. She comes back today to evaluate right breast microcalcifications and possible left breast mass.  EXAM: DIGITAL DIAGNOSTIC BILATERAL MAMMOGRAM WITH TOMOSYNTHESIS AND CAD; ULTRASOUND LEFT BREAST LIMITED TECHNIQUE: Bilateral digital diagnostic mammography and breast tomosynthesis was performed. The images were evaluated with computer-aided detection.; Targeted ultrasound examination of the left breast was performed COMPARISON:  Previous exam(s). ACR Breast Density Category b: There are scattered areas of fibroglandular density. FINDINGS: Examination demonstrates a circumscribed lobulated 1.5 cm mass over the posterior third of the outer midportion of the left breast. This is unchanged from May 2021 on the full field images. Remainder of the left breast is unchanged. Images of the right breast demonstrate a 3 mm group of coarse heterogeneous microcalcifications over the upper outer right breast also without significant change on the full field images compared to May 2021. Remainder of the right breast is unchanged. Targeted ultrasound is performed, showing no focal abnormality over the outer midportion of the left breast to correlate to the mammographic finding. IMPRESSION: 1. Probable benign 1.5 cm mass over the outer midportion of the left breast without sonographic correlate. Stable compared to May 2021. 2. 3 mm group of probable benign microcalcifications over the upper outer right breast. Stable compared to May 2021. RECOMMENDATION: Recommend short interval follow-up bilateral diagnostic mammogram at the time of patient's annual bilateral mammogram in May 2022 to document continued stability. I have discussed the findings and recommendations with the patient. If applicable, a reminder letter will be sent to the patient regarding the next appointment. BI-RADS CATEGORY  3: Probably benign. Electronically Signed   By: Elberta Fortis M.D.   On: 04/10/2020 16:27   MM DIAG BREAST TOMO BILATERAL  Result Date: 04/10/2020 CLINICAL DATA:  Patient presents for bilateral diagnostic examination as a call back from her  screening mammogram from 07/07/2019 for which patient never returned for evaluation. She comes back today to evaluate right breast microcalcifications and possible left breast mass. EXAM: DIGITAL  DIAGNOSTIC BILATERAL MAMMOGRAM WITH TOMOSYNTHESIS AND CAD; ULTRASOUND LEFT BREAST LIMITED TECHNIQUE: Bilateral digital diagnostic mammography and breast tomosynthesis was performed. The images were evaluated with computer-aided detection.; Targeted ultrasound examination of the left breast was performed COMPARISON:  Previous exam(s). ACR Breast Density Category b: There are scattered areas of fibroglandular density. FINDINGS: Examination demonstrates a circumscribed lobulated 1.5 cm mass over the posterior third of the outer midportion of the left breast. This is unchanged from May 2021 on the full field images. Remainder of the left breast is unchanged. Images of the right breast demonstrate a 3 mm group of coarse heterogeneous microcalcifications over the upper outer right breast also without significant change on the full field images compared to May 2021. Remainder of the right breast is unchanged. Targeted ultrasound is performed, showing no focal abnormality over the outer midportion of the left breast to correlate to the mammographic finding. IMPRESSION: 1. Probable benign 1.5 cm mass over the outer midportion of the left breast without sonographic correlate. Stable compared to May 2021. 2. 3 mm group of probable benign microcalcifications over the upper outer right breast. Stable compared to May 2021. RECOMMENDATION: Recommend short interval follow-up bilateral diagnostic mammogram at the time of patient's annual bilateral mammogram in May 2022 to document continued stability. I have discussed the findings and recommendations with the patient. If applicable, a reminder letter will be sent to the patient regarding the next appointment. BI-RADS CATEGORY  3: Probably benign. Electronically Signed   By: Elberta Fortis  M.D.   On: 04/10/2020 16:27       Assessment & Plan:   Problem List Items Addressed This Visit     Essential hypertension, benign    Continue lisinopril/hctz and metoprolol.  Blood pressure doing well.  Follow pressures.  Follow metabolic panel.        Relevant Orders   CBC with Differential/Platelet   Health care maintenance    Physical today 08/23/2020.  Pap smear 08/24/2019.  Bilateral diagnostic mammogram March 2022 BI-RADS 3.   Recommended follow-up bilateral diagnostic mammogram in May 2022.  She is concerned that insurance will not cover. Desires not to schedule diagnostic.  Had referred to GI for colonoscopy.  We can follow-up and confirm she makes appointment.       Hypercholesteremia    Continue crestor.  Low cholesterol diet and exercise.  Follow lipid panel and liver function tests.         Relevant Orders   Hepatic function panel   Lipid panel   Leukopenia    Follow CBC.       Myasthenia gravis (HCC)    Stable.       SVT (supraventricular tachycardia) (HCC)    No increased heart rate or palpitations reported.  Continue metoprolol.  Doing better.  Follow.        Type 2 diabetes mellitus with hyperglycemia (HCC)    Discussed elevated sugars.  Discussed my desire to start insulin, especially given inability to afford the medications (high co-pays) as outlined.  Discussed the need for better sugar control.  She plans to work on diet and exercise.  Declines insulin.  Continue metformin and glipizide.  Follow sugars.  Follow metabolic panel and A1c.       Relevant Orders   Hemoglobin A1c   Basic metabolic panel   Microalbumin / creatinine urine ratio   Other Visit Diagnoses     Routine general medical examination at a health care facility    -  Primary  Physical exam, annual       Relevant Orders   Cytology - PAP( [place]) (Completed)        Dale [place], MD

## 2020-09-03 ENCOUNTER — Encounter: Payer: Self-pay | Admitting: Internal Medicine

## 2020-09-03 NOTE — Assessment & Plan Note (Addendum)
Physical today 08/23/2020.  Pap smear 08/24/2019.  Bilateral diagnostic mammogram March 2022 BI-RADS 3.   Recommended follow-up bilateral diagnostic mammogram in May 2022.  She is concerned that insurance will not cover. Desires not to schedule diagnostic.  Had referred to GI for colonoscopy.  We can follow-up and confirm she makes appointment.

## 2020-09-03 NOTE — Assessment & Plan Note (Signed)
Follow CBC. 

## 2020-09-03 NOTE — Assessment & Plan Note (Signed)
Stable

## 2020-09-03 NOTE — Assessment & Plan Note (Signed)
Continue crestor.  Low cholesterol diet and exercise. Follow lipid panel and liver function tests.   

## 2020-09-03 NOTE — Assessment & Plan Note (Signed)
Discussed elevated sugars.  Discussed my desire to start insulin, especially given inability to afford the medications (high co-pays) as outlined.  Discussed the need for better sugar control.  She plans to work on diet and exercise.  Declines insulin.  Continue metformin and glipizide.  Follow sugars.  Follow metabolic panel and A1c.

## 2020-09-03 NOTE — Assessment & Plan Note (Signed)
Continue lisinopril/hctz and metoprolol.  Blood pressure doing well.  Follow pressures.  Follow metabolic panel.  

## 2020-09-03 NOTE — Assessment & Plan Note (Signed)
No increased heart rate or palpitations reported.  Continue metoprolol.  Doing better.  Follow.

## 2020-09-04 LAB — CYTOLOGY - PAP
Comment: NEGATIVE
Diagnosis: NEGATIVE
High risk HPV: NEGATIVE

## 2020-09-05 ENCOUNTER — Telehealth: Payer: Self-pay | Admitting: Internal Medicine

## 2020-09-05 NOTE — Telephone Encounter (Signed)
Per review of chart, she had bilateral diagnostic mammogram in 04/2020.  Radiology had recommended f/u bilateral diagnostic mammogram in 06/2020.  She had requested not to have diagnostic, but that is what radiology is recommending.  Let me know when agreeable for [place] to schedule.  Also, we had referred her previously for colonoscopy.  It appears she did not keep appt.  Is she ok for referral now? - needs colon cancer screening

## 2020-09-06 NOTE — Telephone Encounter (Signed)
I tried to call patient, but VM was full & could not leave message.

## 2020-09-08 ENCOUNTER — Other Ambulatory Visit: Payer: Self-pay | Admitting: Internal Medicine

## 2020-09-08 DIAGNOSIS — E1165 Type 2 diabetes mellitus with hyperglycemia: Secondary | ICD-10-CM

## 2020-09-10 NOTE — Telephone Encounter (Signed)
Placed call to pt. Pt states she is waiting to hear back from insurance to make sure she will not be charged for another mammogram in the same year . Pt states she has a name of a doctor she wants to see for her colonoscopy and will call the office back with that name.

## 2020-09-10 NOTE — Telephone Encounter (Signed)
Noted.  Hold for return call.

## 2020-09-17 ENCOUNTER — Telehealth: Payer: Self-pay | Admitting: Internal Medicine

## 2020-09-17 DIAGNOSIS — E1165 Type 2 diabetes mellitus with hyperglycemia: Secondary | ICD-10-CM

## 2020-09-17 MED ORDER — GLIPIZIDE 5 MG PO TABS: 5.0000 mg | ORAL_TABLET | Freq: Every day | ORAL | 1 refills | Status: DC

## 2020-09-17 NOTE — Telephone Encounter (Signed)
Patient called and needs a refill on her glipiZIDE (GLUCOTROL) 5 MG tablet, she is out of medication and pharmacy says she can not get refilled until  09/20/2020.

## 2020-09-19 NOTE — Telephone Encounter (Signed)
Called patient to follow up. Unable to LM

## 2020-09-20 NOTE — Telephone Encounter (Signed)
Spoke with patient. Still unsure about mammogram and did not have the name of the GI doc for her colonoscopy on hand right now. She is going to call back with this info.

## 2020-09-26 ENCOUNTER — Ambulatory Visit: Payer: 59 | Admitting: Pharmacist

## 2020-09-26 DIAGNOSIS — I10 Essential (primary) hypertension: Secondary | ICD-10-CM

## 2020-09-26 DIAGNOSIS — E1165 Type 2 diabetes mellitus with hyperglycemia: Secondary | ICD-10-CM

## 2020-09-26 DIAGNOSIS — E7800 Pure hypercholesterolemia, unspecified: Secondary | ICD-10-CM

## 2020-09-26 NOTE — Patient Instructions (Signed)
Visit Information   Goals Addressed               This Visit's Progress     Patient Stated     Medication Monitoring (pt-stated)        Patient Goals/Self-Care Activities Over the next 90 days, patient will:  - take medications as prescribed check blood glucose three times daily using CGM, document, and provide at future appointments collaborate with provider on medication access solutions        Patient verbalizes understanding of instructions provided today and agrees to view in MyChart.   Plan: Telephone follow up appointment with care management team member scheduled for:  pending medication access plan  [name] Feliz Beam, PharmD, [place] [employer] [place], CPP Clinical Pharmacist [employer] [facility] [phone]

## 2020-09-26 NOTE — Chronic Care Management (AMB) (Signed)
Care Management   Pharmacy Note  09/26/2020 Name: Amber Decker MRN: [id number] DOB: [birth year]  Subjective: Amber Decker is a 65 y.o. year old female who is a primary care patient of Dale [place], MD. The Care Management team was consulted for assistance with care management and care coordination needs.    Engaged with patient by telephone for follow up visit in response to provider referral for pharmacy case management and/or care coordination services.   The patient was given information about Care Management services today including:  Care Management services includes personalized support from designated clinical staff supervised by the patient's primary care provider, including individualized plan of care and coordination with other care providers. 24/7 contact phone numbers for assistance for urgent and routine care needs. The patient may stop case management services at any time by phone call to the office staff.  Patient agreed to services and consent obtained.  Assessment:  Review of patient status, including review of consultants reports, laboratory and other test data, was performed as part of comprehensive evaluation and provision of chronic care management services.   SDOH (Social Determinants of Health) assessments and interventions performed:    Objective:  Lab Results  Component Value Date   CREATININE 0.90 06/19/2020   CREATININE 0.98 02/01/2020   CREATININE 1.09 06/30/2019    Lab Results  Component Value Date   HGBA1C 10.1 (H) 06/19/2020       Component Value Date/Time   CHOL 133 06/19/2020 0920   TRIG 62.0 06/19/2020 0920   HDL 67.60 06/19/2020 0920   CHOLHDL 2 06/19/2020 0920   VLDL 12.4 06/19/2020 0920   LDLCALC 53 06/19/2020 0920     Clinical ASCVD: No  The 10-year ASCVD risk score Denman George DC Jr., et al., 2013) is: 11.1%   Values used to calculate the score:     Age: 14 years     Sex: Female     Is Non-Hispanic African American: Yes      Diabetic: Yes     Tobacco smoker: No     Systolic Blood Pressure: 120 mmHg     Is BP treated: Yes     HDL Cholesterol: 67.6 mg/dL     Total Cholesterol: 133 mg/dL     BP Readings from Last 3 Encounters:  09/03/20 120/68  05/22/20 120/72  03/28/20 122/70    Care Plan  Allergies  Allergen Reactions   Erythromycin Hives    Medications Reviewed Today     Reviewed by Lourena Simmonds, RPH-CPP (Pharmacist) on 09/26/20 at 1529  Med List Status: <None>   Medication Order Taking? Sig Documenting Provider Last Dose Status Informant  acyclovir (ZOVIRAX) 400 MG tablet [id number] Yes TAKE 1 TABLET (400 MG TOTAL) DAILY BY MOUTH. Dale [place], MD Taking Active   aspirin EC 81 MG tablet [id number] Yes Take 81 mg by mouth daily. [provider] Taking Active   Continuous Blood Gluc Sensor (FREESTYLE LIBRE 2 SENSOR) [place] [id number]  Use to check glucose at least three times daily Dale [place], MD  Active   glipiZIDE (GLUCOTROL) 5 MG tablet [id number] Yes Take 1 tablet (5 mg total) by mouth daily before supper. Dale [place], MD Taking Active            Med Note Edwin Cap Sep 26, 2020  3:13 PM) 5 mg BID  lisinopril-hydrochlorothiazide (ZESTORETIC) 10-12.5 MG tablet [id number] Yes TAKE 1 TABLET BY MOUTH EVERY DAY Dale [place], MD Taking Active   metFORMIN (GLUCOPHAGE-XR)  500 MG 24 hr tablet [id number] Yes TAKE 2 TABLETS BY MOUTH IN THE MORNING AND 2 AT BEDTIME Dale [place], MD Taking Active   metoprolol tartrate (LOPRESSOR) 25 MG tablet [id number] Yes Take 1 tablet by mouth twice daily Dale [place], MD Taking Active   Multiple Vitamins-Minerals (ALIVE WOMENS 50+ PO) [id number] Yes Take by mouth every other day.  [provider] Taking Active   rosuvastatin (CRESTOR) 5 MG tablet [id number] Yes Take 1 tablet (5 mg total) by mouth daily. 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   Health care maintenance 11/18/2014   Shoulder pain 06/01/2013   GERD (gastroesophageal reflux disease) 02/20/2013   SVT (supraventricular tachycardia) (HCC) 03/25/2012   Essential hypertension, benign 03/25/2012   Type 2 diabetes mellitus with hyperglycemia (HCC) 03/25/2012   Myasthenia gravis (HCC) 03/25/2012    Conditions to be addressed/monitored: HTN, HLD, and DMII  Care Plan : Medication Management  Updates made by Lourena Simmonds, RPH-CPP since 09/26/2020 12:00 AM     Problem: Diabetes      Long-Range Goal: Disease Progression Prevention   This Visit's Progress: On track  Recent Progress: On track  Priority: High  Note:   Current Barriers:  Unable to independently afford treatment regimen Unable to achieve control of diabetes   Pharmacist Clinical Goal(s):  Over the next 90 days, patient will verbalize ability to afford treatment regimen. Over the next 90 days, patient will achieve control of diabetes s evidenced by improvement in A1c through collaboration with PharmD and provider  Interventions: 1:1 collaboration with Dale , MD regarding development and update of comprehensive plan of care as evidenced by provider attestation and co-signature Inter-disciplinary care team collaboration (see longitudinal plan of care) Comprehensive medication review performed; medication list updated in electronic medical record  SDOH: High deductible insurance plan.   Diabetes: Uncontrolled; current treatment: metformin XR 1000 mg BID, glipizide 5 mg BID Hx SGLT2 - patient tolerated, but copay was too high on insurance due to high deductible health  plan Attempted Rybelsus - copay on insurance was too high, even with savings card PA for [facility] was denied. Insurance will not cover if she hasn't tried and failed other SGLT2 options.  Current glucose readings: needs to place a new Libre. Using fingersticks. Fastings: 120-130 Discussed availability of Mounjaro with manufacturer savings card. Copay card should apply to reduce copay to $25, even if her insurance doesn't cover. Will contact device rep for samples and then schedule follow up for medication administration.   Hypertension, hx SVT Controlled per office visit today; current treatment: losartan/HCTZ 100/12.5 mg daily, metoprolol tartrate 25 mg BID Advised to limit caffeine intake to prevent palpitations Previously recommended to continue current regimen.   Hyperlipidemia and ASCVD risk reduction: Controlled; current treatment: rosuvastatin 5 mg daily  Antiplatelet regimen: aspirin 81 mg daily Previously recommended to continue current regimen at this time  Patient Goals/Self-Care Activities Over the next 90 days, patient will:  - take medications as prescribed check blood glucose twice daily, document, and provide at future appointments collaborate with provider on medication access  solutions  Follow Up Plan: Telephone follow up appointment with care management team member scheduled for: pending medication access plan       Medication Assistance:  None required.  Patient affirms current coverage meets needs.  Follow Up:  Patient agrees to Care Plan and Follow-up.  Plan: Telephone follow up appointment with care management team member scheduled for:  pending medication access plan  [name] Feliz Beam, PharmD, [place], CPP Clinical Pharmacist [facility] at [facility] [phone]

## 2020-09-28 ENCOUNTER — Ambulatory Visit: Payer: 59 | Admitting: Pharmacist

## 2020-09-28 ENCOUNTER — Other Ambulatory Visit: Payer: Self-pay

## 2020-09-28 ENCOUNTER — Other Ambulatory Visit: Payer: Self-pay | Admitting: Internal Medicine

## 2020-09-28 DIAGNOSIS — I10 Essential (primary) hypertension: Secondary | ICD-10-CM

## 2020-09-28 DIAGNOSIS — E1165 Type 2 diabetes mellitus with hyperglycemia: Secondary | ICD-10-CM

## 2020-09-28 DIAGNOSIS — I471 Supraventricular tachycardia: Secondary | ICD-10-CM

## 2020-09-28 MED ORDER — TIRZEPATIDE 2.5 MG/0.5ML ~~LOC~~ SOAJ: 2.5000 mg | SUBCUTANEOUS | 0 refills | Status: AC

## 2020-09-28 MED ORDER — TIRZEPATIDE 5 MG/0.5ML ~~LOC~~ SOAJ
5.0000 mg | SUBCUTANEOUS | 2 refills | Status: DC
Start: 2020-10-21 — End: 2020-10-01

## 2020-09-28 NOTE — Chronic Care Management (AMB) (Addendum)
Care Management   Pharmacy Note  09/28/2020 Name: COLLIER MONICA MRN: [id number] DOB: [birth year]  Subjective: Amber Decker is a 65 y.o. year old female who is a primary care patient of Dale [place], MD. The Care Management team was consulted for assistance with care management and care coordination needs.    Engaged with patient face to face for follow up visit in response to provider referral for pharmacy case management and/or care coordination services.   The patient was given information about Care Management services today including:  Care Management services includes personalized support from designated clinical staff supervised by the patient's primary care provider, including individualized plan of care and coordination with other care providers. 24/7 contact phone numbers for assistance for urgent and routine care needs. The patient may stop case management services at any time by phone call to the office staff.  Patient agreed to services and consent obtained.  Assessment:  Review of patient status, including review of consultants reports, laboratory and other test data, was performed as part of comprehensive evaluation and provision of chronic care management services.   SDOH (Social Determinants of Health) assessments and interventions performed:    Objective:  Lab Results  Component Value Date   CREATININE 0.90 06/19/2020   CREATININE 0.98 02/01/2020   CREATININE 1.09 06/30/2019    Lab Results  Component Value Date   HGBA1C 10.1 (H) 06/19/2020       Component Value Date/Time   CHOL 133 06/19/2020 0920   TRIG 62.0 06/19/2020 0920   HDL 67.60 06/19/2020 0920   CHOLHDL 2 06/19/2020 0920   VLDL 12.4 06/19/2020 0920   LDLCALC 53 06/19/2020 0920     Clinical ASCVD: No  The 10-year ASCVD risk score Denman George DC Jr., et al., 2013) is: 11.1%   Values used to calculate the score:     Age: 22 years     Sex: Female     Is Non-Hispanic African American: Yes      Diabetic: Yes     Tobacco smoker: No     Systolic Blood Pressure: 120 mmHg     Is BP treated: Yes     HDL Cholesterol: 67.6 mg/dL     Total Cholesterol: 133 mg/dL     BP Readings from Last 3 Encounters:  09/03/20 120/68  05/22/20 120/72  03/28/20 122/70    Care Plan  Allergies  Allergen Reactions   Erythromycin Hives    Medications Reviewed Today     Reviewed by Lourena Simmonds, RPH-CPP (Pharmacist) on 09/26/20 at 1529  Med List Status: <None>   Medication Order Taking? Sig Documenting Provider Last Dose Status Informant  acyclovir (ZOVIRAX) 400 MG tablet [id number] Yes TAKE 1 TABLET (400 MG TOTAL) DAILY BY MOUTH. Dale [place], MD Taking Active   aspirin EC 81 MG tablet [id number] Yes Take 81 mg by mouth daily. [provider] Taking Active   Continuous Blood Gluc Sensor (FREESTYLE LIBRE 2 SENSOR) [place] [id number]  Use to check glucose at least three times daily Dale [place], MD  Active   glipiZIDE (GLUCOTROL) 5 MG tablet [id number] Yes Take 1 tablet (5 mg total) by mouth daily before supper. Dale [place], MD Taking Active            Med Note Edwin Cap Sep 26, 2020  3:13 PM) 5 mg BID  lisinopril-hydrochlorothiazide (ZESTORETIC) 10-12.5 MG tablet [id number] Yes TAKE 1 TABLET BY MOUTH EVERY DAY Dale [place], MD Taking Active   metFORMIN (  GLUCOPHAGE-XR) 500 MG 24 hr tablet [id number] Yes TAKE 2 TABLETS BY MOUTH IN THE MORNING AND 2 AT BEDTIME Dale [place], MD Taking Active   metoprolol tartrate (LOPRESSOR) 25 MG tablet [id number] Yes Take 1 tablet by mouth twice daily Dale [place], MD Taking Active   Multiple Vitamins-Minerals (ALIVE WOMENS 50+ PO) [id number] Yes Take by mouth every other day.  [provider] Taking Active   rosuvastatin (CRESTOR) 5 MG tablet [id number] Yes Take 1 tablet (5 mg total) by mouth daily. 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   Health care maintenance 11/18/2014   Shoulder pain 06/01/2013   GERD (gastroesophageal reflux disease) 02/20/2013   SVT (supraventricular tachycardia) (HCC) 03/25/2012   Essential hypertension, benign 03/25/2012   Type 2 diabetes mellitus with hyperglycemia (HCC) 03/25/2012   Myasthenia gravis (HCC) 03/25/2012    Conditions to be addressed/monitored: HTN, HLD, and DMII  Care Plan : Medication Management  Updates made by Lourena Simmonds, RPH-CPP since 09/28/2020 12:00 AM     Problem: Diabetes      Long-Range Goal: Disease Progression Prevention   This Visit's Progress: On track  Recent Progress: On track  Priority: High  Note:   Current Barriers:  Unable to independently afford treatment regimen Unable to achieve control of diabetes   Pharmacist Clinical Goal(s):  Over the next 90 days, patient will verbalize ability to afford treatment regimen. Over the next 90 days, patient will achieve control of diabetes s evidenced by improvement in A1c through collaboration with PharmD and provider  Interventions: 1:1 collaboration with Dale , MD regarding development and update of comprehensive plan of care as evidenced by provider attestation and co-signature Inter-disciplinary care team collaboration (see longitudinal plan of care) Comprehensive medication review performed; medication list updated in electronic medical record  SDOH: High deductible insurance plan.   Diabetes: Uncontrolled; current treatment: metformin XR 1000 mg BID, glipizide 5 mg BID Hx SGLT2 - patient tolerated, but copay was too high on insurance due to high deductible health  plan Attempted Rybelsus - copay on insurance was too high, even with savings card PA for [facility] was denied. Insurance will not cover if she hasn't tried and failed other SGLT2 options.  Current glucose readings: needs to place a new Libre. Using fingersticks. Fastings: 120-130 Discussed availability of Mounjaro with manufacturer savings card. Copay card should apply to reduce copay to $25, even if her insurance doesn't cover. Patient presented today for injection demonstration. Provided sample. Start Mounjaro 2.5 mg weekly for 4 weeks, then increase to 5 mg weekly. Stop glipizide.    Hypertension, hx SVT Controlled per office visit today; current treatment: losartan/HCTZ 100/12.5 mg daily, metoprolol tartrate 25 mg BID Advised to limit caffeine intake to prevent palpitations Previously recommended to continue current regimen.   Hyperlipidemia and ASCVD risk reduction: Controlled; current treatment: rosuvastatin 5 mg daily  Antiplatelet regimen: aspirin 81 mg daily Previously recommended to continue current regimen at this time  Patient Goals/Self-Care Activities Over the next 90 days, patient will:  - take medications as prescribed check blood glucose twice daily,  document, and provide at future appointments collaborate with provider on medication access solutions  Follow Up Plan: Telephone follow up appointment with care management team member scheduled for: ~2 weeks     Medication Assistance:  None required.  Patient affirms current coverage meets needs.  Follow Up:  Patient agrees to Care Plan and Follow-up.  Plan: Telephone follow up appointment with care management team member scheduled for:  ~ 2 weeks  [name] Feliz Beam, PharmD, [place], CPP Clinical Pharmacist [facility] [phone]  Medication Samples have been provided to the patient.  Drug name: Greggory Keen       Strength: 2.5 mg        Qty: 1 box  LOT: [id number]  Exp.Date: 11/13/21

## 2020-09-28 NOTE — Patient Instructions (Signed)
Visit Information   Goals Addressed               This Visit's Progress     Patient Stated     Medication Monitoring (pt-stated)        Patient Goals/Self-Care Activities Over the next 90 days, patient will:  - take medications as prescribed check blood glucose three times daily using CGM, document, and provide at future appointments collaborate with provider on medication access solutions         Patient verbalizes understanding of instructions provided today and agrees to view in MyChart.    Plan: Telephone follow up appointment with care management team member scheduled for:  ~ 2 weeks  [name] Feliz Beam, PharmD, [place], CPP Clinical Pharmacist [facility] at [employer] [phone]

## 2020-10-11 ENCOUNTER — Ambulatory Visit: Payer: 59 | Admitting: Pharmacist

## 2020-10-11 DIAGNOSIS — Z6841 Body Mass Index (BMI) 40.0 and over, adult: Secondary | ICD-10-CM

## 2020-10-11 DIAGNOSIS — E1165 Type 2 diabetes mellitus with hyperglycemia: Secondary | ICD-10-CM

## 2020-10-11 DIAGNOSIS — I10 Essential (primary) hypertension: Secondary | ICD-10-CM

## 2020-10-11 NOTE — Patient Instructions (Signed)
Visit Information   Goals Addressed               This Visit's Progress     Patient Stated     Medication Monitoring (pt-stated)        Patient Goals/Self-Care Activities Over the next 90 days, patient will:  - take medications as prescribed check blood glucose three times daily using CGM, document, and provide at future appointments collaborate with provider on medication access solutions        Patient verbalizes understanding of instructions provided today and agrees to view in MyChart.   Plan: Telephone follow up appointment with care management team member scheduled for:  ~ 3 weeks  [name] Feliz Beam, PharmD, [place], CPP Clinical Pharmacist [facility] at [employer] [phone]

## 2020-10-11 NOTE — Chronic Care Management (AMB) (Signed)
Care Management   Pharmacy Note  10/11/2020 Name: Amber Decker MRN: [id number] DOB: 05-10-1955  Subjective: Amber Decker is a 65 y.o. year old female who is a primary care patient of Dale [place], MD. The Care Management team was consulted for assistance with care management and care coordination needs.    Engaged with patient by telephone for follow up visit in response to provider referral for pharmacy case management and/or care coordination services.   The patient was given information about Care Management services today including:  Care Management services includes personalized support from designated clinical staff supervised by the patient's primary care provider, including individualized plan of care and coordination with other care providers. 24/7 contact phone numbers for assistance for urgent and routine care needs. The patient may stop case management services at any time by phone call to the office staff.  Patient agreed to services and consent obtained.  Assessment:  Review of patient status, including review of consultants reports, laboratory and other test data, was performed as part of comprehensive evaluation and provision of chronic care management services.   SDOH (Social Determinants of Health) assessments and interventions performed:  SDOH Interventions    Flowsheet Row Most Recent Value  SDOH Interventions   Financial Strain Interventions Other (Comment)  [high deductible insurance plan]        Objective:  Lab Results  Component Value Date   CREATININE 0.90 06/19/2020   CREATININE 0.98 02/01/2020   CREATININE 1.09 06/30/2019    Lab Results  Component Value Date   HGBA1C 10.1 (H) 06/19/2020       Component Value Date/Time   CHOL 133 06/19/2020 0920   TRIG 62.0 06/19/2020 0920   HDL 67.60 06/19/2020 0920   CHOLHDL 2 06/19/2020 0920   VLDL 12.4 06/19/2020 0920   LDLCALC 53 06/19/2020 0920     Clinical ASCVD: No  The 10-year ASCVD  risk score Denman George DC Jr., et al., 2013) is: 11.1%   Values used to calculate the score:     Age: 35 years     Sex: Female     Is Non-Hispanic African American: Yes     Diabetic: Yes     Tobacco smoker: No     Systolic Blood Pressure: 120 mmHg     Is BP treated: Yes     HDL Cholesterol: 67.6 mg/dL     Total Cholesterol: 133 mg/dL    BP Readings from Last 3 Encounters:  09/03/20 120/68  05/22/20 120/72  03/28/20 122/70    Care Plan  Allergies  Allergen Reactions   Erythromycin Hives    Medications Reviewed Today     Reviewed by Lourena Simmonds, RPH-CPP (Pharmacist) on 09/26/20 at 1529  Med List Status: <None>   Medication Order Taking? Sig Documenting Provider Last Dose Status Informant  acyclovir (ZOVIRAX) 400 MG tablet [id number] Yes TAKE 1 TABLET (400 MG TOTAL) DAILY BY MOUTH. Dale [place], MD Taking Active   aspirin EC 81 MG tablet [id number] Yes Take 81 mg by mouth daily. [provider] Taking Active   Continuous Blood Gluc Sensor (FREESTYLE LIBRE 2 SENSOR) [place] [id number]  Use to check glucose at least three times daily Dale [place], MD  Active   glipiZIDE (GLUCOTROL) 5 MG tablet [id number] Yes Take 1 tablet (5 mg total) by mouth daily before supper. Dale [place], MD Taking Active            Med Note Edwin Cap Sep 26, 2020  3:13 PM) 5 mg BID  lisinopril-hydrochlorothiazide (ZESTORETIC) 10-12.5 MG tablet [id number] Yes TAKE 1 TABLET BY MOUTH EVERY DAY Dale [place], MD Taking Active   metFORMIN (GLUCOPHAGE-XR) 500 MG 24 hr tablet [id number] Yes TAKE 2 TABLETS BY MOUTH IN THE MORNING AND 2 AT BEDTIME Dale [place], MD Taking Active   metoprolol tartrate (LOPRESSOR) 25 MG tablet [id number] Yes Take 1 tablet by mouth twice daily Dale [place], MD Taking Active   Multiple Vitamins-Minerals (ALIVE WOMENS 50+ PO) [id number] Yes Take by mouth every other day.  [provider] Taking Active   rosuvastatin (CRESTOR) 5 MG tablet [id number]  Yes Take 1 tablet (5 mg total) by mouth daily. 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   Health care maintenance 11/18/2014   Shoulder pain 06/01/2013   GERD (gastroesophageal reflux disease) 02/20/2013   SVT (supraventricular tachycardia) (HCC) 03/25/2012   Essential hypertension, benign 03/25/2012   Type 2 diabetes mellitus with hyperglycemia (HCC) 03/25/2012   Myasthenia gravis (HCC) 03/25/2012    Conditions to be addressed/monitored: DMII  Care Plan : Medication Management  Updates made by Lourena Simmonds, RPH-CPP since 10/11/2020 12:00 AM     Problem: Diabetes      Long-Range Goal: Disease Progression Prevention   This Visit's Progress: On track  Recent Progress: On track  Priority: High  Note:   Current Barriers:  Unable to independently afford treatment regimen Unable to achieve control of diabetes   Pharmacist Clinical Goal(s):  Over the next 90 days, patient will verbalize ability to afford treatment regimen. Over the next 90 days, patient will achieve control of diabetes s evidenced by improvement in A1c through collaboration with PharmD and provider  Interventions: 1:1 collaboration with Dale , MD regarding development and update of comprehensive plan of care as evidenced by provider attestation and co-signature Inter-disciplinary care team collaboration (see longitudinal plan of care) Comprehensive medication review performed; medication list updated in electronic medical record  SDOH: High deductible insurance plan.   Diabetes: Uncontrolled; current treatment: metformin XR  1000 mg BID, Mounjaro 2.5 mg weekly x1 dose Does endorse decrease in appetite since starting Mounjaro. Some diarrhea, some upset stomach, but altogether tolerable Hx SGLT2 - patient tolerated, but copay was too high on insurance due to high deductible health plan Attempted Rybelsus - copay on insurance was too high, even with savings card PA for [facility] was denied. Insurance will not cover if she hasn't tried and failed other SGLT2 options.  Baseline weight: has not weighed herself, but does feel a change in her clothes fitting better.  Current meal patterns: breakfast: applesauce, english egg and cheese muffin; lunch: glucerna; focusing on drinking more water, but knows she is not getting the full 60 oz she would like to Current glucose readings: needs to place a new Libre. Has not checked any blood sugar readings. Recommended to continue current regimen at this time, complete 4 weeks of Mounjaro 2.5 mg then increase to 5 mg. She will check with the pharmacy to make sure the prescription was covered by the manufacture program for $25.  Hypertension, hx SVT Controlled per office visit today; current treatment: losartan/HCTZ 100/12.5 mg daily, metoprolol tartrate 25 mg BID Advised to limit caffeine intake to prevent palpitations Previously recommended to continue current regimen.   Hyperlipidemia and ASCVD risk reduction: Controlled; current treatment: rosuvastatin 5 mg daily  Antiplatelet regimen: aspirin 81 mg daily Previously recommended to continue current regimen at this time  Patient Goals/Self-Care Activities Over the next 90 days, patient will:  - take medications as prescribed check blood glucose twice daily, document, and provide at future appointments collaborate with provider on medication access solutions  Follow Up Plan: Telephone follow up appointment with care management team member scheduled for: ~3 weeks     Medication Assistance:  None required.  Patient affirms  current coverage meets needs.  Follow Up:  Patient agrees to Care Plan and Follow-up.  Plan: Telephone follow up appointment with care management team member scheduled for:  ~ 3 weeks  [name] Feliz Beam, PharmD, [place], CPP Clinical Pharmacist [facility] [employer] [phone]

## 2020-10-12 ENCOUNTER — Telehealth: Payer: 59

## 2020-10-22 ENCOUNTER — Other Ambulatory Visit: Payer: Self-pay | Admitting: Internal Medicine

## 2020-10-24 ENCOUNTER — Other Ambulatory Visit: Payer: Self-pay

## 2020-10-24 ENCOUNTER — Other Ambulatory Visit (INDEPENDENT_AMBULATORY_CARE_PROVIDER_SITE_OTHER): Payer: 59

## 2020-10-24 DIAGNOSIS — E7800 Pure hypercholesterolemia, unspecified: Secondary | ICD-10-CM | POA: Diagnosis not present

## 2020-10-24 DIAGNOSIS — I10 Essential (primary) hypertension: Secondary | ICD-10-CM

## 2020-10-24 DIAGNOSIS — E1165 Type 2 diabetes mellitus with hyperglycemia: Secondary | ICD-10-CM

## 2020-10-24 LAB — BASIC METABOLIC PANEL
BUN: 16 mg/dL (ref 6–23)
CO2: 27 mEq/L (ref 19–32)
Calcium: 9.8 mg/dL (ref 8.4–10.5)
Chloride: 103 mEq/L (ref 96–112)
Creatinine, Ser: 0.93 mg/dL (ref 0.40–1.20)
GFR: 65.06 mL/min (ref >60.00)
Glucose, Bld: 140 mg/dL — ABNORMAL HIGH (ref 70–99)
Potassium: 4.4 mEq/L (ref 3.5–5.1)
Sodium: 139 mEq/L (ref 135–145)

## 2020-10-24 LAB — LIPID PANEL
Cholesterol: 120 mg/dL (ref 0–200)
HDL: 55.8 mg/dL (ref >39.00)
LDL Cholesterol: 53 mg/dL (ref 0–99)
NonHDL: 64.28
Total CHOL/HDL Ratio: 2
Triglycerides: 57 mg/dL (ref 0.0–149.0)
VLDL: 11.4 mg/dL (ref 0.0–40.0)

## 2020-10-24 LAB — CBC WITH DIFFERENTIAL/PLATELET
Basophils Absolute: 0 10*3/uL (ref 0.0–0.1)
Basophils Relative: 1 % (ref 0.0–3.0)
Eosinophils Absolute: 0.1 10*3/uL (ref 0.0–0.7)
Eosinophils Relative: 3.7 % (ref 0.0–5.0)
HCT: 39.4 % (ref 36.0–46.0)
Hemoglobin: 13.2 g/dL (ref 12.0–15.0)
Lymphocytes Relative: 43.2 % (ref 12.0–46.0)
Lymphs Abs: 1.6 10*3/uL (ref 0.7–4.0)
MCHC: 33.6 g/dL (ref 30.0–36.0)
MCV: 90.4 fl (ref 78.0–100.0)
Monocytes Absolute: 0.5 10*3/uL (ref 0.1–1.0)
Monocytes Relative: 12.1 % — ABNORMAL HIGH (ref 3.0–12.0)
Neutro Abs: 1.5 10*3/uL (ref 1.4–7.7)
Neutrophils Relative %: 40 % — ABNORMAL LOW (ref 43.0–77.0)
Platelets: 210 10*3/uL (ref 150.0–400.0)
RBC: 4.36 Mil/uL (ref 3.87–5.11)
RDW: 13.1 % (ref 11.5–15.5)
WBC: 3.8 10*3/uL — ABNORMAL LOW (ref 4.0–10.5)

## 2020-10-24 LAB — HEPATIC FUNCTION PANEL
ALT: 13 U/L (ref 0–35)
AST: 14 U/L (ref 0–37)
Albumin: 4.1 g/dL (ref 3.5–5.2)
Alkaline Phosphatase: 47 U/L (ref 39–117)
Bilirubin, Direct: 0.1 mg/dL (ref 0.0–0.3)
Total Bilirubin: 0.4 mg/dL (ref 0.2–1.2)
Total Protein: 6.9 g/dL (ref 6.0–8.3)

## 2020-10-24 LAB — MICROALBUMIN / CREATININE URINE RATIO
Creatinine,U: 71.8 mg/dL
Microalb Creat Ratio: 1 mg/g (ref 0.0–30.0)
Microalb, Ur: <0.7 mg/dL (ref 0.0–1.9)

## 2020-10-24 LAB — HEMOGLOBIN A1C: Hgb A1c MFr Bld: 8.5 % — ABNORMAL HIGH (ref 4.6–6.5)

## 2020-10-25 ENCOUNTER — Telehealth: Payer: Self-pay | Admitting: Pharmacist

## 2020-10-25 ENCOUNTER — Telehealth: Payer: 59

## 2020-10-25 NOTE — Telephone Encounter (Signed)
  Chronic Care Management   Note  10/25/2020 Name: Amber Decker MRN: [id number] DOB: [birth year]   Attempted to contact patient for scheduled appointment for medication management support. Left HIPAA compliant message for patient to return my call at their convenience.    Plan: - If I do not hear back from the patient by end of business today, will collaborate with Care Guide to outreach to schedule follow up with me   [name] Feliz Beam, PharmD, [place], CPP Clinical Pharmacist [facility] [employer] [phone]

## 2020-10-29 ENCOUNTER — Other Ambulatory Visit: Payer: Self-pay | Admitting: Internal Medicine

## 2020-10-29 ENCOUNTER — Telehealth: Payer: Self-pay

## 2020-10-29 NOTE — Chronic Care Management (AMB) (Signed)
  Care Management   Note  10/29/2020 Name: [name] MRN: [id number] DOB: 28-Feb-1955  Amber Decker is a 65 y.o. year old female who is a primary care patient of Dale [place], MD and is actively engaged with the care management team. I reached out to Amber Decker by phone today to assist with re-scheduling a follow up visit with the Pharmacist  Follow up plan: Unsuccessful telephone outreach attempt made. A HIPAA compliant phone message was left for the patient providing contact information and requesting a return call.  The care management team will reach out to the patient again over the next 7 days.  If patient returns call to provider office, please advise to call Embedded Care Management Care Guide Penne Lash  at [phone]  Penne Lash, RMA Care Guide, Embedded Care Coordination [facility]  [place], [place] [address] Direct Dial: [phone] [email or web address]

## 2020-11-02 ENCOUNTER — Ambulatory Visit: Payer: 59 | Admitting: Pharmacist

## 2020-11-02 DIAGNOSIS — E1165 Type 2 diabetes mellitus with hyperglycemia: Secondary | ICD-10-CM

## 2020-11-02 MED ORDER — MOUNJARO 5 MG/0.5ML ~~LOC~~ SOAJ: SUBCUTANEOUS | 2 refills | Status: DC

## 2020-11-02 NOTE — Chronic Care Management (AMB) (Signed)
Care Management   Pharmacy Note  11/02/2020 Name: Amber Decker MRN: [id number] DOB: 1955/05/22  Subjective: Amber Decker is a 65 y.o. year old female who is a primary care patient of Dale [place]alo, MD. The Care Management team was consulted for assistance with care management and care coordination needs.    Engaged with patient by telephone for follow up visit in response to provider referral for pharmacy case management and/or care coordination services.   The patient was given information about Care Management services today including:  Care Management services includes personalized support from designated clinical staff supervised by the patient's primary care provider, including individualized plan of care and coordination with other care providers. 24/7 contact phone numbers for assistance for urgent and routine care needs. The patient may stop case management services at any time by phone call to the office staff.  Patient agreed to services and consent obtained.  Assessment:  Review of patient status, including review of consultants reports, laboratory and other test data, was performed as part of comprehensive evaluation and provision of chronic care management services.   SDOH (Social Determinants of Health) assessments and interventions performed:    Objective:  Lab Results  Component Value Date   CREATININE 0.93 10/24/2020   CREATININE 0.90 06/19/2020   CREATININE 0.98 02/01/2020    Lab Results  Component Value Date   HGBA1C 8.5 (H) 10/24/2020       Component Value Date/Time   CHOL 120 10/24/2020 0842   TRIG 57.0 10/24/2020 0842   HDL 55.80 10/24/2020 0842   CHOLHDL 2 10/24/2020 0842   VLDL 11.4 10/24/2020 0842   LDLCALC 53 10/24/2020 0842      BP Readings from Last 3 Encounters:  09/03/20 120/68  05/22/20 120/72  03/28/20 122/70    Care Plan  Allergies  Allergen Reactions   Erythromycin Hives    Medications Reviewed Today      Reviewed by Lourena Simmonds, RPH-CPP (Pharmacist) on 09/26/20 at 1529  Med List Status: <None>   Medication Order Taking? Sig Documenting Provider Last Dose Status Informant  acyclovir (ZOVIRAX) 400 MG tablet [id number] Yes TAKE 1 TABLET (400 MG TOTAL) DAILY BY MOUTH. Dale [place], MD Taking Active   aspirin EC 81 MG tablet [id number] Yes Take 81 mg by mouth daily. [provider] Taking Active   Continuous Blood Gluc Sensor (FREESTYLE LIBRE 2 SENSOR) [place] [id number]  Use to check glucose at least three times daily Dale [place], MD  Active   glipiZIDE (GLUCOTROL) 5 MG tablet [id number] Yes Take 1 tablet (5 mg total) by mouth daily before supper. Dale [place], MD Taking Active            Med Note Edwin Cap Sep 26, 2020  3:13 PM) 5 mg BID  lisinopril-hydrochlorothiazide (ZESTORETIC) 10-12.5 MG tablet [id number] Yes TAKE 1 TABLET BY MOUTH EVERY DAY Dale [place], MD Taking Active   metFORMIN (GLUCOPHAGE-XR) 500 MG 24 hr tablet [id number] Yes TAKE 2 TABLETS BY MOUTH IN THE MORNING AND 2 AT BEDTIME Dale [place], MD Taking Active   metoprolol tartrate (LOPRESSOR) 25 MG tablet [id number] Yes Take 1 tablet by mouth twice daily Dale [place], MD Taking Active   Multiple Vitamins-Minerals (ALIVE WOMENS 50+ PO) [id number] Yes Take by mouth every other day.  [provider] Taking Active   rosuvastatin (CRESTOR) 5 MG tablet [id number] Yes Take 1 tablet (5 mg total) by mouth daily. 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   Health care maintenance 11/18/2014   Shoulder pain 06/01/2013   GERD  (gastroesophageal reflux disease) 02/20/2013   SVT (supraventricular tachycardia) (HCC) 03/25/2012   Essential hypertension, benign 03/25/2012   Type 2 diabetes mellitus with hyperglycemia (HCC) 03/25/2012   Myasthenia gravis (HCC) 03/25/2012    Conditions to be addressed/monitored: DMII  Care Plan : Medication Management  Updates made by Lourena Simmonds, RPH-CPP since 11/02/2020 12:00 AM     Problem: Diabetes      Long-Range Goal: Disease Progression Prevention   Recent Progress: On track  Priority: High  Note:   Current Barriers:  Unable to independently afford treatment regimen Unable to achieve control of diabetes   Pharmacist Clinical Goal(s):  Over the next 90 days, patient will verbalize ability to afford treatment regimen. Over the next 90 days, patient will achieve control of diabetes s evidenced by improvement in A1c through collaboration with PharmD and provider  Interventions: 1:1 collaboration with Dale [place], MD regarding development and update of comprehensive plan of care as evidenced by provider attestation and co-signature Inter-disciplinary care team collaboration (see longitudinal plan of care) Comprehensive medication review performed; medication list updated in electronic medical record  SDOH: High deductible insurance plan.   Diabetes: Uncontrolled; current treatment: metformin XR 1000 mg BID, Mounjaro 2.5 mg weekly Does endorse decrease in appetite since starting Mounjaro. Some diarrhea, some upset stomach, but altogether tolerable Hx SGLT2 - patient tolerated, but copay was too high on insurance due to high deductible health plan Attempted Rybelsus - copay on insurance was too high, even with savings card PA for [facility] was denied. Insurance will not cover if she hasn't tried and failed other SGLT2 options.  Patient preferred [facility] script go to [place]. Sent script, contacted pharmacy and Greggory Keen ran through for $25. Patient will pick  up and increase to 5 mg weekly. We will follow up in 4 weeks as PCP visit.   Hypertension, hx SVT Controlled per office visit today; current treatment: losartan/HCTZ 100/12.5 mg daily, metoprolol tartrate 25 mg BID Advised to limit caffeine intake to prevent palpitations Previously recommended to continue current regimen.   Hyperlipidemia and ASCVD risk reduction: Controlled; current treatment: rosuvastatin 5 mg daily  Antiplatelet regimen: aspirin 81 mg daily Previously recommended to continue current regimen at this time  Patient Goals/Self-Care Activities Over the next 90 days, patient will:  - take medications as prescribed check blood glucose twice daily, document, and provide at future appointments collaborate with provider on medication access solutions  Follow Up Plan: Face to Face appointment with care management team member scheduled for:  ~4 weeks     Medication Assistance:  None required.  Patient affirms current coverage meets needs.  Follow Up:  Patient agrees to Care Plan and Follow-up.  Plan: Face to Face appointment with care management team member scheduled for: 4 weeks  [name] Feliz Beam, PharmD, [place], CPP Clinical Pharmacist [facility] [employer] [phone]

## 2020-11-02 NOTE — Patient Instructions (Signed)
Visit Information   Goals Addressed               This Visit's Progress     Patient Stated     Medication Monitoring (pt-stated)        Patient Goals/Self-Care Activities Over the next 90 days, patient will:  - take medications as prescribed check blood glucose three times daily using CGM, document, and provide at future appointments collaborate with provider on medication access solutions         Patient verbalizes understanding of instructions provided today and agrees to view in MyChart.   Plan: Face to Face appointment with care management team member scheduled for: 4 weeks  [name] Feliz Beam, PharmD, [place], CPP Clinical Pharmacist [facility] [employer] [phone]

## 2020-11-29 ENCOUNTER — Ambulatory Visit (INDEPENDENT_AMBULATORY_CARE_PROVIDER_SITE_OTHER): Payer: 59 | Admitting: Internal Medicine

## 2020-11-29 ENCOUNTER — Other Ambulatory Visit: Payer: Self-pay

## 2020-11-29 ENCOUNTER — Ambulatory Visit: Payer: 59

## 2020-11-29 DIAGNOSIS — Z23 Encounter for immunization: Secondary | ICD-10-CM

## 2020-11-29 DIAGNOSIS — D72819 Decreased white blood cell count, unspecified: Secondary | ICD-10-CM

## 2020-11-29 DIAGNOSIS — E1165 Type 2 diabetes mellitus with hyperglycemia: Secondary | ICD-10-CM | POA: Diagnosis not present

## 2020-11-29 DIAGNOSIS — I10 Essential (primary) hypertension: Secondary | ICD-10-CM | POA: Diagnosis not present

## 2020-11-29 DIAGNOSIS — I471 Supraventricular tachycardia, unspecified: Secondary | ICD-10-CM

## 2020-11-29 DIAGNOSIS — E7800 Pure hypercholesterolemia, unspecified: Secondary | ICD-10-CM

## 2020-11-29 DIAGNOSIS — Z1211 Encounter for screening for malignant neoplasm of colon: Secondary | ICD-10-CM

## 2020-11-29 DIAGNOSIS — G7000 Myasthenia gravis without (acute) exacerbation: Secondary | ICD-10-CM

## 2020-11-29 NOTE — Progress Notes (Signed)
Patient ID: Amber Decker, female   DOB: 12-08-1955, 65 y.o.   MRN: [id number]   Subjective:    Patient ID: Amber Decker, female    DOB: [birth year], 65 y.o.   MRN: [id number]  This visit occurred during the SARS-CoV-2 public health emergency.  Safety protocols were in place, including screening questions prior to the visit, additional usage of staff PPE, and extensive cleaning of exam room while observing appropriate contact time as indicated for disinfecting solutions.   Patient here for a scheduled follow up.   Chief Complaint  Patient presents with   Diabetes   Hypertension   Hyperlipidemia   .   HPI Feeling better.  Sugars better.  AM sugars 110-120s.  Discussed diet and exercise.  No chest pain, increased heart rate or palpitations noted.  No cough or congestion.  No acid reflux.  No abdominal pain.  Bowels moving.  Discussed need for colonoscopy and eye exam.  She will schedule eye exam.    Past Medical History:  Diagnosis Date   Diabetes mellitus without complication ([place]) 7829   Herpes    Hypertension    Myasthenia gravis ([place])    onset age 48's?   SVT (supraventricular tachycardia) (HCC)    Past Surgical History:  Procedure Laterality Date   MYOMECTOMY     UTERINE FIBROID SURGERY     at [facility]   Family History  Problem Relation Age of Onset   Diabetes Mother    Alcohol abuse Father    Heart disease Father        myocardial infarction   Breast cancer Neg Hx    Colon cancer Neg Hx    Social History   Socioeconomic History   Marital status: Divorced    Spouse name: Not on file   Number of children: 0   Years of education: Not on file   Highest education level: Not on file  Occupational History    Employer: OTHER  Tobacco Use   Smoking status: Never   Smokeless tobacco: Never  Substance and Sexual Activity   Alcohol use: Yes    Alcohol/week: 0.0 standard drinks    Comment: occasionally   Drug use: No   Sexual activity: Not on file  Other Topics  Concern   Not on file  Social History Narrative   Not on file   Social Determinants of Health   Financial Resource Strain: Medium Risk   Difficulty of Paying Living Expenses: Somewhat hard  Food Insecurity: Not on file  Transportation Needs: Not on file  Physical Activity: Not on file  Stress: Not on file  Social Connections: Not on file    Review of Systems  Constitutional:  Negative for appetite change and unexpected weight change.  HENT:  Negative for congestion and sinus pressure.   Respiratory:  Negative for cough, chest tightness and shortness of breath.   Cardiovascular:  Negative for chest pain, palpitations and leg swelling.  Gastrointestinal:  Negative for abdominal pain, diarrhea, nausea and vomiting.  Genitourinary:  Negative for difficulty urinating and dysuria.  Musculoskeletal:  Negative for joint swelling and myalgias.  Skin:  Negative for color change and rash.  Neurological:  Negative for dizziness, light-headedness and headaches.  Psychiatric/Behavioral:  Negative for agitation and dysphoric mood.       Objective:     BP 112/70   Pulse 76   Temp 97.6 F (36.4 C)   Resp 16   Ht _0  (1.753 m)   Wt 266  lb (120.7 kg)   LMP 02/21/2012   SpO2 99%   BMI 39.28 kg/m  Wt Readings from Last 3 Encounters:  11/29/20 266 lb (120.7 kg)  08/23/20 271 lb 9.6 oz (123.2 kg)  05/22/20 275 lb (124.7 kg)    Physical Exam Vitals reviewed.  Constitutional:      General: She is not in acute distress.    Appearance: Normal appearance.  HENT:     Head: Normocephalic and atraumatic.     Right Ear: External ear normal.     Left Ear: External ear normal.  Eyes:     General: No scleral icterus.       Right eye: No discharge.        Left eye: No discharge.     Conjunctiva/sclera: Conjunctivae normal.  Neck:     Thyroid: No thyromegaly.  Cardiovascular:     Rate and Rhythm: Normal rate and regular rhythm.  Pulmonary:     Effort: No respiratory distress.      Breath sounds: Normal breath sounds. No wheezing.  Abdominal:     General: Bowel sounds are normal.     Palpations: Abdomen is soft.     Tenderness: There is no abdominal tenderness.  Musculoskeletal:        General: No swelling or tenderness.     Cervical back: Neck supple. No tenderness.  Lymphadenopathy:     Cervical: No cervical adenopathy.  Skin:    Findings: No erythema or rash.  Neurological:     Mental Status: She is alert.  Psychiatric:        Mood and Affect: Mood normal.        Behavior: Behavior normal.     Outpatient Encounter Medications as of 11/29/2020  Medication Sig   acyclovir (ZOVIRAX) 400 MG tablet TAKE 1 TABLET (400 MG TOTAL) DAILY BY MOUTH.   aspirin EC 81 MG tablet Take 81 mg by mouth daily.   Continuous Blood Gluc Sensor (FREESTYLE LIBRE 2 SENSOR) MISC Use to check glucose at least three times daily   lisinopril-hydrochlorothiazide (ZESTORETIC) 10-12.5 MG tablet Take 1 tablet by mouth once daily   metFORMIN (GLUCOPHAGE-XR) 500 MG 24 hr tablet TAKE 2 TABLETS BY MOUTH IN THE MORNING AND 2 AT BEDTIME   metoprolol tartrate (LOPRESSOR) 25 MG tablet Take 1 tablet by mouth twice daily   Multiple Vitamins-Minerals (ALIVE WOMENS 50+ PO) Take by mouth every other day.    rosuvastatin (CRESTOR) 5 MG tablet Take 1 tablet (5 mg total) by mouth daily.   tirzepatide (MOUNJARO) 5 MG/0.5ML Pen INJECT 5 MG INTO THE SKIN ONCE A WEEK.   No facility-administered encounter medications on file as of 11/29/2020.     Lab Results  Component Value Date   WBC 3.8 (L) 10/24/2020   HGB 13.2 10/24/2020   HCT 39.4 10/24/2020   PLT 210.0 10/24/2020   GLUCOSE 140 (H) 10/24/2020   CHOL 120 10/24/2020   TRIG 57.0 10/24/2020   HDL 55.80 10/24/2020   LDLCALC 53 10/24/2020   ALT 13 10/24/2020   AST 14 10/24/2020   NA 139 10/24/2020   K 4.4 10/24/2020   CL 103 10/24/2020   CREATININE 0.93 10/24/2020   BUN 16 10/24/2020   CO2 27 10/24/2020   TSH 2.22 06/19/2020   HGBA1C 8.5 (H)  10/24/2020   MICROALBUR <0.7 10/24/2020       Assessment & Plan:   Problem List Items Addressed This Visit     Colon cancer screening  Discussed again the need for colonoscopy.  She is agreeable.  Referral placed.        Relevant Orders   Ambulatory referral to Gastroenterology   Essential hypertension, benign    Continue lisinopril/hctz and metoprolol.  Blood pressure doing well.  Follow pressures.  Follow metabolic panel.       Hypercholesteremia    Continue crestor.  Low cholesterol diet and exercise.  Follow lipid panel and liver function tests.        Relevant Orders   Hepatic function panel   Lipid panel   Leukopenia    Last wbc count 3.8.  Follow cbc.        Relevant Orders   CBC with Differential/Platelet   Myasthenia gravis ([place])    Stable.       SVT (supraventricular tachycardia) (HCC)    No increased heart rate or palpitations reported.  Continue metoprolol.  Doing better.  Follow.       Type 2 diabetes mellitus with hyperglycemia ([place])    Per her report sugars doing better as outlined.  Continue metformin.  On mounjaro now.  Discussed diet and exercise.  Follow met b and a1c.        Relevant Orders   Basic metabolic panel   Hemoglobin A1c   Other Visit Diagnoses     Need for immunization against influenza       Relevant Orders   Flu Vaccine QUAD 56moIM (Fluarix, Fluzone & Alfiuria Quad PF) (Completed)        CEinar Pheasant MD

## 2020-11-30 ENCOUNTER — Telehealth: Payer: Self-pay

## 2020-11-30 NOTE — Chronic Care Management (AMB) (Signed)
  Care Management   Note  11/30/2020 Name: [name] MRN: [id number] DOB: [birth year]  Amber Decker is a 65 y.o. year old female who is a primary care patient of Dale [place], MD and is actively engaged with the care management team. I reached out to Amber Decker by phone today to assist with re-scheduling a follow up visit with the Pharmacist  Follow up plan: Unsuccessful telephone outreach attempt made. A HIPAA compliant phone message was left for the patient providing contact information and requesting a return call.  The care management team will reach out to the patient again over the next 7 days.  If patient returns call to provider office, please advise to call Embedded Care Management Care Guide Penne Lash  at [phone]  Penne Lash, RMA Care Guide, Embedded Care Coordination [facility]  [place], [place] [address] Direct Dial: [phone] [email or web address]: [email or web address]

## 2020-12-01 ENCOUNTER — Encounter: Payer: Self-pay | Admitting: Internal Medicine

## 2020-12-01 NOTE — Assessment & Plan Note (Signed)
Per her report sugars doing better as outlined.  Continue metformin.  On mounjaro now.  Discussed diet and exercise.  Follow met b and a1c.

## 2020-12-01 NOTE — Assessment & Plan Note (Signed)
Last wbc count 3.8.  Follow cbc.

## 2020-12-01 NOTE — Assessment & Plan Note (Signed)
Discussed again the need for colonoscopy.  She is agreeable.  Referral placed.

## 2020-12-01 NOTE — Assessment & Plan Note (Signed)
Stable

## 2020-12-01 NOTE — Assessment & Plan Note (Signed)
No increased heart rate or palpitations reported.  Continue metoprolol.  Doing better.  Follow.  

## 2020-12-01 NOTE — Assessment & Plan Note (Signed)
Continue lisinopril/hctz and metoprolol.  Blood pressure doing well.  Follow pressures.  Follow metabolic panel.  

## 2020-12-01 NOTE — Assessment & Plan Note (Signed)
Continue crestor.  Low cholesterol diet and exercise. Follow lipid panel and liver function tests.   

## 2020-12-18 NOTE — Chronic Care Management (AMB) (Signed)
  Care Management   Note  12/18/2020 Name: NAHLA LUKIN MRN: [id number] DOB: Nov 02, 1955  America Brown is a 65 y.o. year old female who is a primary care patient of Dale [place], MD and is actively engaged with the care management team. I reached out to America Brown by phone today to assist with re-scheduling a follow up visit with the Pharmacist  Follow up plan: Unsuccessful telephone outreach attempt made. A HIPAA compliant phone message was left for the patient providing contact information and requesting a return call.  The care management team will reach out to the patient again over the next 7 days.  If patient returns call to provider office, please advise to call Embedded Care Management Care Guide Penne Lash  at [phone]  Penne Lash, RMA Care Guide, Embedded Care Coordination [facility]  [place], [place] [address] Direct Dial: [phone] [email or web address] Website: [email or web address]

## 2020-12-26 NOTE — Chronic Care Management (AMB) (Signed)
  Care Management   Note  12/26/2020 Name: [name] MRN: [id number] DOB: 01/15/1956  Amber Decker is a 65 y.o. year old female who is a primary care patient of Dale [place], MD and is actively engaged with the care management team. I reached out to Amber Decker by phone today to assist with re-scheduling a follow up visit with the Pharmacist  Follow up plan: Telephone appointment with care management team member scheduled for:01/29/2021  Penne Lash, RMA Care Guide, Embedded Care Coordination [facility]  [place], [place] [address] Direct Dial: [phone] [email or web address] Website: [email or web address]

## 2021-01-16 ENCOUNTER — Other Ambulatory Visit: Payer: Self-pay | Admitting: Internal Medicine

## 2021-01-16 DIAGNOSIS — E1165 Type 2 diabetes mellitus with hyperglycemia: Secondary | ICD-10-CM

## 2021-01-28 ENCOUNTER — Telehealth: Payer: Self-pay | Admitting: Internal Medicine

## 2021-01-28 NOTE — Telephone Encounter (Signed)
Called [facility]. Per pharmacist, rejection is due to medication being non formulary. Providing sample to tide patient over the next 2 weeks. Scheduled f/u on 1/3 to determine next steps. 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   Medication Samples have been labeled and logged for the patient.  Drug name: Greggory Keen       Strength: 2.5 mg        Qty: 1 box  LOT: [id number]  Exp.Date: 08/21/22  Dosing instructions: Inject 2 pens (5 mg) once weekly  The patient has been instructed regarding the correct time, dose, and frequency of taking this medication, including desired effects and most common side effects.   Lourena Simmonds 4:19 PM 01/28/2021

## 2021-01-28 NOTE — Telephone Encounter (Signed)
Can you help with this?

## 2021-01-28 NOTE — Telephone Encounter (Signed)
Patient call in because  pharmacy advise Patient she need to call her doctor  about this prescription mounjarobest to be filled , the number to contact patient [phone] . [facility] pharmacy [phone]

## 2021-01-29 ENCOUNTER — Telehealth: Payer: 59

## 2021-01-29 ENCOUNTER — Other Ambulatory Visit: Payer: Self-pay | Admitting: Internal Medicine

## 2021-02-12 ENCOUNTER — Telehealth: Payer: Self-pay | Admitting: Pharmacist

## 2021-02-12 ENCOUNTER — Ambulatory Visit: Payer: 59 | Admitting: Pharmacist

## 2021-02-12 DIAGNOSIS — E1165 Type 2 diabetes mellitus with hyperglycemia: Secondary | ICD-10-CM

## 2021-02-12 DIAGNOSIS — I10 Essential (primary) hypertension: Secondary | ICD-10-CM

## 2021-02-12 MED ORDER — MOUNJARO 5 MG/0.5ML ~~LOC~~ SOAJ: SUBCUTANEOUS | 2 refills | Status: DC

## 2021-02-12 NOTE — Addendum Note (Signed)
Addended by: Lourena Simmonds on: 02/12/2021 12:49 PM   Modules accepted: Orders

## 2021-02-12 NOTE — Telephone Encounter (Signed)
PA submitted for Mounjaro 5 mg (Key: [id number]). Will follow for result

## 2021-02-12 NOTE — Chronic Care Management (AMB) (Addendum)
Chronic Care Management CCM Pharmacy Note  02/12/2021 Name:  Amber Decker MRN:  [id number] DOB:  07-Oct-1955  Summary: - New insurance plan this year. Seeking Moujaro coverage  Recommendations/Changes made from today's visit: - PA submitted. Approved.   Subjective: Amber Decker is an 66 y.o. year old female who is a primary patient of Einar Pheasant, MD.  The CCM team was consulted for assistance with disease management and care coordination needs.    Engaged with patient by telephone for follow up visit for pharmacy case management and/or care coordination services.   Objective:  Medications Reviewed Today     Reviewed by Einar Pheasant, MD (Physician) on 12/01/20 at 1542  Med List Status: <None>   Medication Order Taking? Sig Documenting Provider Last Dose Status Informant  acyclovir (ZOVIRAX) 400 MG tablet [id number] No TAKE 1 TABLET (400 MG TOTAL) DAILY BY MOUTH. Einar Pheasant, MD Taking Active   aspirin EC 81 MG tablet [id number] No Take 81 mg by mouth daily. [provider] Taking Active   Continuous Blood Gluc Sensor (FREESTYLE LIBRE 2 SENSOR) MISC [id number] No Use to check glucose at least three times daily Einar Pheasant, MD Taking Active   lisinopril-hydrochlorothiazide (ZESTORETIC) 10-12.5 MG tablet [id number]  Take 1 tablet by mouth once daily Einar Pheasant, MD  Active   metFORMIN (GLUCOPHAGE-XR) 500 MG 24 hr tablet [id number] No TAKE 2 TABLETS BY MOUTH IN THE MORNING AND 2 AT BEDTIME Einar Pheasant, MD Taking Active   metoprolol tartrate (LOPRESSOR) 25 MG tablet [id number]  Take 1 tablet by mouth twice daily Einar Pheasant, MD  Active   Multiple Vitamins-Minerals (ALIVE WOMENS 50+ PO) [id number] No Take by mouth every other day.  [provider] Taking Active   rosuvastatin (CRESTOR) 5 MG tablet [id number] No Take 1 tablet (5 mg total) by mouth daily. Einar Pheasant, MD Taking Active   tirzepatide [facility]) 5 MG/0.5ML Pen [id number]  INJECT 5 MG  INTO THE SKIN ONCE A WEEK. Einar Pheasant, MD  Active             Pertinent Labs:   Lab Results  Component Value Date   HGBA1C 8.5 (H) 10/24/2020   Lab Results  Component Value Date   CHOL 120 10/24/2020   HDL 55.80 10/24/2020   LDLCALC 53 10/24/2020   TRIG 57.0 10/24/2020   CHOLHDL 2 10/24/2020   Lab Results  Component Value Date   CREATININE 0.93 10/24/2020   BUN 16 10/24/2020   NA 139 10/24/2020   K 4.4 10/24/2020   CL 103 10/24/2020   CO2 27 10/24/2020    SDOH:  (Social Determinants of Health) assessments and interventions performed:    [place]  Review of patient past medical history, allergies, medications, health status, including review of consultants reports, laboratory and other test data, was performed as part of comprehensive evaluation and provision of chronic care management services.   Care Plan : Medication Management  Updates made by De Hollingshead, RPH-CPP since 02/12/2021 12:00 AM     Problem: Diabetes      Long-Range Goal: Disease Progression Prevention   Recent Progress: On track  Priority: High  Note:   Current Barriers:  Unable to independently afford treatment regimen Unable to achieve control of diabetes   Pharmacist Clinical Goal(s):  Over the next 90 days, patient will verbalize ability to afford treatment regimen. Over the next 90 days, patient will achieve control of diabetes s evidenced by improvement in A1c through collaboration with  PharmD and provider  Interventions: 1:1 collaboration with Einar Pheasant, MD regarding development and update of comprehensive plan of care as evidenced by provider attestation and co-signature Inter-disciplinary care team collaboration (see longitudinal plan of care) Comprehensive medication review performed; medication list updated in electronic medical record  Diabetes: Uncontrolled; current treatment: metformin XR 1000 mg BID, Mounjaro 5 mg weekly Reports tolerating Mounjaro  well.  Hx SGLT2 - patient tolerated, but copay was too high on insurance due to high deductible health plan Attempted Rybelsus - copay on insurance was too high, even with savings card PA for [facility] was denied. Insurance will not cover if she hasn't tried and failed other SGLT2 options.  New insurance plan. Submitted [place] PA today. Approved through 02/12/2022. Script sent to the pharmacy. Continue current regimen at this time  Hypertension, hx SVT Controlled per last office visit; current treatment: losartan/HCTZ 100/12.5 mg daily, metoprolol tartrate 25 mg BID Advised to limit caffeine intake to prevent palpitations Previously recommended to continue current regimen.   Hyperlipidemia and ASCVD risk reduction: Controlled; current treatment: rosuvastatin 5 mg daily  Antiplatelet regimen: aspirin 81 mg daily Previously recommended to continue current regimen at this time  Patient Goals/Self-Care Activities Over the next 90 days, patient will:  - take medications as prescribed check blood glucose twice daily, document, and provide at future appointments collaborate with provider on medication access solutions       Plan: Telephone follow up appointment with care management team member scheduled for:  12 weeks  [name] Darnelle Maffucci, PharmD, [place], [place] Clinical Pharmacist [employer] at [employer] [phone]

## 2021-02-12 NOTE — Telephone Encounter (Signed)
PA approved through 02/12/2022

## 2021-02-12 NOTE — Patient Instructions (Addendum)
Visit Information  Following are the goals we discussed today:  Patient Goals/Self-Care Activities Over the next 90 days, patient will:  - take medications as prescribed check blood glucose twice daily, document, and provide at future appointments collaborate with provider on medication access solutions          Plan: Telephone follow up appointment with care management team member scheduled for:   weeks [name] Feliz Beam, PharmD, [place], CPP Clinical Pharmacist [facility] [phone]   Please call the care guide team at [phone] if you need to cancel or reschedule your appointment.   Patient verbalizes understanding of instructions provided today and agrees to view in MyChart.

## 2021-03-06 ENCOUNTER — Ambulatory Visit: Payer: 59 | Admitting: Internal Medicine

## 2021-03-10 ENCOUNTER — Other Ambulatory Visit: Payer: Self-pay | Admitting: Internal Medicine

## 2021-03-10 DIAGNOSIS — E7800 Pure hypercholesterolemia, unspecified: Secondary | ICD-10-CM

## 2021-03-12 ENCOUNTER — Encounter: Payer: Self-pay | Admitting: Internal Medicine

## 2021-03-12 ENCOUNTER — Ambulatory Visit (INDEPENDENT_AMBULATORY_CARE_PROVIDER_SITE_OTHER): Payer: Self-pay | Admitting: Internal Medicine

## 2021-03-12 ENCOUNTER — Other Ambulatory Visit: Payer: Self-pay

## 2021-03-12 VITALS — BP 116/70 | HR 88 | Temp 97.9°F | Resp 16 | Ht 69.0 in | Wt 257.4 lb

## 2021-03-12 DIAGNOSIS — I10 Essential (primary) hypertension: Secondary | ICD-10-CM

## 2021-03-12 DIAGNOSIS — G7000 Myasthenia gravis without (acute) exacerbation: Secondary | ICD-10-CM

## 2021-03-12 DIAGNOSIS — E1165 Type 2 diabetes mellitus with hyperglycemia: Secondary | ICD-10-CM

## 2021-03-12 DIAGNOSIS — R928 Other abnormal and inconclusive findings on diagnostic imaging of breast: Secondary | ICD-10-CM

## 2021-03-12 DIAGNOSIS — D72819 Decreased white blood cell count, unspecified: Secondary | ICD-10-CM

## 2021-03-12 DIAGNOSIS — I471 Supraventricular tachycardia: Secondary | ICD-10-CM

## 2021-03-12 DIAGNOSIS — E7800 Pure hypercholesterolemia, unspecified: Secondary | ICD-10-CM

## 2021-03-12 DIAGNOSIS — Z1231 Encounter for screening mammogram for malignant neoplasm of breast: Secondary | ICD-10-CM

## 2021-03-12 NOTE — Progress Notes (Signed)
Patient ID: Fayrene Helper, female   DOB: Jul 10, 1955, 65 y.o.   MRN: [id number]   Subjective:    Patient ID: Fayrene Helper, female    DOB: 03-16-1955, 66 y.o.   MRN: [id number]  This visit occurred during the SARS-CoV-2 public health emergency.  Safety protocols were in place, including screening questions prior to the visit, additional usage of staff PPE, and extensive cleaning of exam room while observing appropriate contact time as indicated for disinfecting solutions.   Patient here for a scheduled follow up.   Chief Complaint  Patient presents with   Hypertension   Diabetes   Hyperlipidemia   .   HPI On mounjaro.  Tolerating.  No checking sugars.  Not using libre.  States am sugars when she did check - 120s.  Discussed low carb diet and exercise.  No chest pain.  Breathing stable.  No increased heart rate or palpitations reported.  No abdominal pain.  Bowels moving.  Discussed overdue for mammogram and colonoscopy.  Wants to hold on colonoscopy for now.     Past Medical History:  Diagnosis Date   Diabetes mellitus without complication ([place]) 8616   Herpes    Hypertension    Myasthenia gravis [facility])    onset age 50's?   SVT (supraventricular tachycardia) (HCC)    Past Surgical History:  Procedure Laterality Date   MYOMECTOMY     UTERINE FIBROID SURGERY     at [place]   Family History  Problem Relation Age of Onset   Diabetes Mother    Alcohol abuse Father    Heart disease Father        myocardial infarction   Breast cancer Neg Hx    Colon cancer Neg Hx    Social History   Socioeconomic History   Marital status: Divorced    Spouse name: Not on file   Number of children: 0   Years of education: Not on file   Highest education level: Not on file  Occupational History    Employer: OTHER  Tobacco Use   Smoking status: Never   Smokeless tobacco: Never  Substance and Sexual Activity   Alcohol use: Yes    Alcohol/week: 0.0 standard drinks    Comment: occasionally    Drug use: No   Sexual activity: Not on file  Other Topics Concern   Not on file  Social History Narrative   Not on file   Social Determinants of Health   Financial Resource Strain: Medium Risk   Difficulty of Paying Living Expenses: Somewhat hard  Food Insecurity: Not on file  Transportation Needs: Not on file  Physical Activity: Not on file  Stress: Not on file  Social Connections: Not on file     Review of Systems  Constitutional:  Negative for appetite change and unexpected weight change.  HENT:  Negative for congestion and sinus pressure.   Respiratory:  Negative for cough, chest tightness and shortness of breath.   Cardiovascular:  Negative for chest pain, palpitations and leg swelling.  Gastrointestinal:  Negative for abdominal pain, diarrhea, nausea and vomiting.  Genitourinary:  Negative for difficulty urinating and dysuria.  Musculoskeletal:  Negative for joint swelling and myalgias.  Skin:  Negative for color change and rash.  Neurological:  Negative for dizziness, light-headedness and headaches.  Psychiatric/Behavioral:  Negative for agitation and dysphoric mood.       Objective:     BP 116/70    Pulse 88    Temp 97.9 F (36.6  C)    Resp 16    Ht 5\' 9"  (1.753 m)    Wt 257 lb 6.4 oz (116.8 kg)    LMP 02/21/2012    SpO2 99%    BMI 38.01 kg/m  Wt Readings from Last 3 Encounters:  03/12/21 257 lb 6.4 oz (116.8 kg)  11/29/20 266 lb (120.7 kg)  08/23/20 271 lb 9.6 oz (123.2 kg)    Physical Exam Vitals reviewed.  Constitutional:      General: She is not in acute distress.    Appearance: Normal appearance.  HENT:     Head: Normocephalic and atraumatic.     Right Ear: External ear normal.     Left Ear: External ear normal.  Eyes:     General: No scleral icterus.       Right eye: No discharge.        Left eye: No discharge.     Conjunctiva/sclera: Conjunctivae normal.  Neck:     Thyroid: No thyromegaly.  Cardiovascular:     Rate and Rhythm: Normal rate  and regular rhythm.  Pulmonary:     Effort: No respiratory distress.     Breath sounds: Normal breath sounds. No wheezing.  Abdominal:     General: Bowel sounds are normal.     Palpations: Abdomen is soft.     Tenderness: There is no abdominal tenderness.  Musculoskeletal:        General: No swelling or tenderness.     Cervical back: Neck supple. No tenderness.  Lymphadenopathy:     Cervical: No cervical adenopathy.  Skin:    Findings: No erythema or rash.  Neurological:     Mental Status: She is alert.  Psychiatric:        Mood and Affect: Mood normal.        Behavior: Behavior normal.     Outpatient Encounter Medications as of 03/12/2021  Medication Sig   acyclovir (ZOVIRAX) 400 MG tablet TAKE 1 TABLET (400 MG TOTAL) DAILY BY MOUTH.   aspirin EC 81 MG tablet Take 81 mg by mouth daily.   Continuous Blood Gluc Sensor (FREESTYLE LIBRE 2 SENSOR) MISC Use to check glucose at least three times daily   lisinopril-hydrochlorothiazide (ZESTORETIC) 10-12.5 MG tablet TAKE 1 TABLET BY MOUTH EVERY DAY   metFORMIN (GLUCOPHAGE-XR) 500 MG 24 hr tablet TAKE 2 TABLETS BY MOUTH IN THE MORNING AND 2 TABLETS AT BEDTIME.   metoprolol tartrate (LOPRESSOR) 25 MG tablet Take 1 tablet by mouth twice daily   Multiple Vitamins-Minerals (ALIVE WOMENS 50+ PO) Take by mouth every other day.    rosuvastatin (CRESTOR) 5 MG tablet Take 1 tablet by mouth once daily   tirzepatide (MOUNJARO) 5 MG/0.5ML Pen INJECT 5MG  INTO THE SKIN ONCE PER WEEK   No facility-administered encounter medications on file as of 03/12/2021.     Lab Results  Component Value Date   WBC 3.8 (L) 10/24/2020   HGB 13.2 10/24/2020   HCT 39.4 10/24/2020   PLT 210.0 10/24/2020   GLUCOSE 140 (H) 10/24/2020   CHOL 120 10/24/2020   TRIG 57.0 10/24/2020   HDL 55.80 10/24/2020   LDLCALC 53 10/24/2020   ALT 13 10/24/2020   AST 14 10/24/2020   NA 139 10/24/2020   K 4.4 10/24/2020   CL 103 10/24/2020   CREATININE 0.93 10/24/2020   BUN 16  10/24/2020   CO2 27 10/24/2020   TSH 2.22 06/19/2020   HGBA1C 8.5 (H) 10/24/2020   MICROALBUR <0.7 10/24/2020  Assessment & Plan:   Problem List Items Addressed This Visit     Abnormal mammogram    Discussed the need for f/u mammogram.  Overdue.  Discussed radiology recommending diagnostic mammogram.  Agreed to screening.  Ordered.  See if can schedule.        Essential hypertension, benign    Continue lisinopril/hctz and metoprolol.  Blood pressure doing well.  Follow pressures.  Follow metabolic panel.       Hypercholesteremia    Continue crestor.  Low cholesterol diet and exercise.  Follow lipid panel and liver function tests.        Relevant Orders   TSH   Leukopenia    White count stable.  Follow.       Myasthenia gravis ([place])    Stable.       SVT (supraventricular tachycardia) (HCC)    No increased heart rate or palpitations reported.  Continue metoprolol.  Doing better.  Follow.       Type 2 diabetes mellitus with hyperglycemia (HCC)    Not checking sugars.  On mounjaro and metformin. Discussed low carb diet and exercise.  Follow met b and a1c.        Other Visit Diagnoses     Encounter for screening mammogram for malignant neoplasm of breast    -  Primary   Relevant Orders   MM 3D SCREEN BREAST BILATERAL        Einar Pheasant, MD

## 2021-03-18 ENCOUNTER — Encounter: Payer: Self-pay | Admitting: Internal Medicine

## 2021-03-18 ENCOUNTER — Telehealth: Payer: Self-pay | Admitting: Internal Medicine

## 2021-03-18 DIAGNOSIS — E1165 Type 2 diabetes mellitus with hyperglycemia: Secondary | ICD-10-CM

## 2021-03-18 NOTE — Assessment & Plan Note (Signed)
Discussed the need for f/u mammogram.  Overdue.  Discussed radiology recommending diagnostic mammogram.  Agreed to screening.  Ordered.  See if can schedule.

## 2021-03-18 NOTE — Assessment & Plan Note (Signed)
Stable

## 2021-03-18 NOTE — Assessment & Plan Note (Signed)
White count stable.  Follow.

## 2021-03-18 NOTE — Assessment & Plan Note (Signed)
No increased heart rate or palpitations reported.  Continue metoprolol.  Doing better.  Follow.  

## 2021-03-18 NOTE — Telephone Encounter (Signed)
Pt called in stating that she went to pharmacy to pickup medication (tirzepatide [facility]) 5 MG/0.5ML Pen). Pt stated that the pharmacy advise her that medication cost is $92.00. Pt stated that the last time she pickup medication refill at pharmacy the medication cost was only $25.00. Pt stated she doesn't understand why she have to pay so much. Pt stated that catie had got insurance approving for the amount of $25.00. Pt requesting callback.

## 2021-03-18 NOTE — Assessment & Plan Note (Signed)
Continue crestor.  Low cholesterol diet and exercise. Follow lipid panel and liver function tests.   

## 2021-03-18 NOTE — Assessment & Plan Note (Signed)
Continue lisinopril/hctz and metoprolol.  Blood pressure doing well.  Follow pressures.  Follow metabolic panel.  

## 2021-03-18 NOTE — Assessment & Plan Note (Signed)
Not checking sugars.  On mounjaro and metformin. Discussed low carb diet and exercise.  Follow met b and a1c.

## 2021-03-19 NOTE — Telephone Encounter (Signed)
Would ask patient if she has a deductible for first of the year? It looks like she just filled mounjaro on 1/29 then again on 2/1? So it may be too soon to fill.  Is she out of medication?

## 2021-03-20 NOTE — Telephone Encounter (Signed)
Placed call to [facility]. Speak with [employer] RPh who advises: patient's copayment for Greggory Keen is currently $50. States insurance sending claim information back with e-coupon and $92 cost is after going through both the insurance and the e-coupon. Pharmacy also has Mounjaro savings card on file for patient, but unable to run through both e-coupon and savings card. States have tried multiple methods of running this claim through [employer] and coupons, but this is the lowest cost. States patient's copayment for same [facility] Rx was $25 last month, without coupon or savings card. RPh confirms tried to contact pharmacy help desk, but no solution offered and pharmacy advised patient to call insurance member services.   I attempt to reach patient by telephone today. No answer and unable to leave a message as her mailbox is full.  When able to reach patient, would recommend she call health plan to request explanation for why cost is $92 this month, rather than $25 as it was last month.  Estelle Grumbles, PharmD, [facility] Clinical Pharmacist [facility] [phone]

## 2021-03-20 NOTE — Telephone Encounter (Signed)
Received call back from patient. She will call to follow up with her [facility] health plan.  In the meantime, patient reports that she was due for her Mounjaro injection on Sunday, but is out of the medication. Does the office have a sample of the Mounjaro 5 mg strength to offer her?  Thank you!  Estelle Grumbles, PharmD, [facility] Clinical Pharmacist [facility] [phone]

## 2021-03-25 ENCOUNTER — Other Ambulatory Visit: Payer: Self-pay | Admitting: Internal Medicine

## 2021-03-25 DIAGNOSIS — E7800 Pure hypercholesterolemia, unspecified: Secondary | ICD-10-CM

## 2021-03-25 NOTE — Telephone Encounter (Signed)
Spoke with Amber Decker. Will follow up with patient later this week to determine what she learned from [place]. We do not have Mounjaro 5 mg samples.

## 2021-03-26 ENCOUNTER — Other Ambulatory Visit: Payer: Self-pay

## 2021-03-26 MED ORDER — MOUNJARO 5 MG/0.5ML ~~LOC~~ SOAJ
SUBCUTANEOUS | 3 refills | Status: DC
  Filled 2021-03-26 (×7): qty 2, 28d supply, fill #0

## 2021-03-26 MED ORDER — TIRZEPATIDE 2.5 MG/0.5ML ~~LOC~~ SOAJ
2.5000 mg | SUBCUTANEOUS | 1 refills | Status: DC
  Filled 2021-03-26: qty 2, 28d supply, fill #0

## 2021-03-26 NOTE — Addendum Note (Signed)
Addended by: Lourena Simmonds on: 03/26/2021 11:54 AM   Modules accepted: Orders

## 2021-03-26 NOTE — Telephone Encounter (Signed)
Pt called in stating she is at the [facility] to pick up a prescription munjaro. Pt states she wanted the coupon one which is 2.5 and not the higher dose which is costing 92 dollars. Pt does not know which one to pick up. Pt would like to be called.

## 2021-03-26 NOTE — Telephone Encounter (Signed)
Received message from [place] at [facility], patient requesting 2.5 mg dose for 1 month so that she can use the free month supply coupon. Sending now.   Spoke with [employer] rep. He suggested that we check to see if [facility] still has the information on file for the original savings card she initially used in September of 2022. I will call over there tomorrow for that.

## 2021-03-26 NOTE — Addendum Note (Signed)
Addended by: Lourena Simmonds on: 03/26/2021 04:20 PM   Modules accepted: Orders

## 2021-03-26 NOTE — Telephone Encounter (Signed)
Spoke with patient. Agreed to send script to [facility] for access support. Collaborated with Maureen Ralphs. Appears patient's insurance applies a savings card automatically that cannot be combined with manufacturer savings card. Copay is $92 per month. Discussed with patient. She will continue on Mounjaro until she picks up Medicare coverage in June and we will evaluate coverage options then.   Attempted free 30 day supply coupon, this only covers 2.5 mg dose.   Provided information for [place] [facility] to help patient evaluate insurance options for Medicare.

## 2021-04-08 ENCOUNTER — Telehealth: Payer: Self-pay | Admitting: Internal Medicine

## 2021-04-08 NOTE — Telephone Encounter (Signed)
Received notification that she is overdue a mammogram.  Needs to be scheduled.  Thanks.

## 2021-04-08 NOTE — Telephone Encounter (Signed)
Spoke with patient. Radiology was recommending diagnostic. Patient does not want diagnostic. [place] will not let me schedule. She is going to have to call [facility] and discuss with them

## 2021-04-16 ENCOUNTER — Telehealth: Payer: Self-pay

## 2021-04-17 ENCOUNTER — Ambulatory Visit: Payer: Self-pay | Admitting: Pharmacist

## 2021-04-17 DIAGNOSIS — E1165 Type 2 diabetes mellitus with hyperglycemia: Secondary | ICD-10-CM

## 2021-04-17 DIAGNOSIS — E7800 Pure hypercholesterolemia, unspecified: Secondary | ICD-10-CM

## 2021-04-17 DIAGNOSIS — I10 Essential (primary) hypertension: Secondary | ICD-10-CM

## 2021-04-17 MED ORDER — LISINOPRIL-HYDROCHLOROTHIAZIDE 10-12.5 MG PO TABS: 1.0000 | ORAL_TABLET | Freq: Every day | ORAL | 1 refills | Status: DC

## 2021-04-17 MED ORDER — METFORMIN HCL ER 500 MG PO TB24: 1000.0000 mg | ORAL_TABLET | Freq: Two times a day (BID) | ORAL | 1 refills | Status: DC

## 2021-04-17 MED ORDER — ROSUVASTATIN CALCIUM 5 MG PO TABS: 5.0000 mg | ORAL_TABLET | Freq: Every day | ORAL | 1 refills | Status: DC

## 2021-04-17 MED ORDER — TIRZEPATIDE 2.5 MG/0.5ML ~~LOC~~ SOAJ: 2.5000 mg | SUBCUTANEOUS | 4 refills | Status: DC

## 2021-04-17 MED ORDER — METOPROLOL TARTRATE 25 MG PO TABS: 25.0000 mg | ORAL_TABLET | Freq: Two times a day (BID) | ORAL | 1 refills | Status: DC

## 2021-04-17 NOTE — Patient Instructions (Signed)
[place],  ? ?Keep up the great work! ? ?Continue metformin 1000 mg twice daily and Mounjaro 2.5 mg weekly for now. Fill a month supply. I will work with the practice on getting you a 2 month supply of sample to tide you over until your Medicare plan starts in 3 months.  ? ?Check your blood sugars twice daily:  ?1) Fasting, first thing in the morning before breakfast and  ?2) 2 hours after your largest meal.  ? ?For a goal A1c of less than 7%, goal fasting readings are less than 130 and goal 2 hour after meal readings are less than 180.  ? ?When you are investigating insurance plans, talk to them about coverage of Mounjaro (or Ozempic - this would be an appropriate substitution if your insurance prefers this to Ozempic).  ? ?It has been a pleasure caring for you! ? ?[name] Feliz Beam, PharmD ?

## 2021-04-17 NOTE — Chronic Care Management (AMB) (Signed)
? ?Chronic Care Management ?CCM Pharmacy Note ? ?04/17/2021 ?Name:  Amber Decker MRN:  [id number] DOB:  [birth year] ? ?Summary: ?- Tolerating regimen well. Difficulty affording copay on current insurance plan, but will transition to Medicare in a few months. ? ?Recommendations/Changes made from today's visit: ?- Continue current regimen. Patient will fill Mounjaro once, then we will try to supply 2 month of samples  ? ?Subjective: ?Amber Decker is an 66 y.o. year old female who is a primary patient of Dale Westfield, MD.  The CCM team was consulted for assistance with disease management and care coordination needs.   ? ?Engaged with patient by telephone for follow up visit for pharmacy case management and/or care coordination services.  ? ?Objective: ? ?Medications Reviewed Today   ? ? Reviewed by Lourena Simmonds, RPH-CPP (Pharmacist) on 04/17/21 at [phone]  Med List Status: <None>  ? ?Medication Order Taking? Sig Documenting Provider Last Dose Status Informant  ?acyclovir (ZOVIRAX) 400 MG tablet [id number] Yes TAKE 1 TABLET (400 MG TOTAL) DAILY BY MOUTH. Dale Trommald, MD Taking Active   ?aspirin EC 81 MG tablet [id number] Yes Take 81 mg by mouth daily. [provider] Taking Active   ?lisinopril-hydrochlorothiazide (ZESTORETIC) 10-12.5 MG tablet [id number] Yes TAKE 1 TABLET BY MOUTH EVERY DAY Dale Damon, MD Taking Active   ?metFORMIN (GLUCOPHAGE-XR) 500 MG 24 hr tablet [id number] Yes TAKE 2 TABLETS BY MOUTH IN THE MORNING AND 2 TABLETS AT BEDTIME. Dale Harrod, MD Taking Active   ?metoprolol tartrate (LOPRESSOR) 25 MG tablet [id number] Yes Take 1 tablet by mouth twice daily Dale [place], MD Taking Active   ?Multiple Vitamins-Minerals (ALIVE WOMENS 50+ PO) [id number] Yes Take by mouth every other day.  [provider] Taking Active   ?rosuvastatin (CRESTOR) 5 MG tablet [id number] Yes Take 1 tablet by mouth once daily Dale [place], MD Taking Active   ?tirzepatide [facility]) 2.5  MG/0.5ML Pen [id number] Yes Inject 2.5 mg into the skin once a week. Lourena Simmonds, RPH-CPP Taking Active   ?tirzepatide [facility]) 5 MG/0.5ML Pen [id number]  INJECT 5MG  INTO THE SKIN ONCE PER WEEK , MD  Active   ? ?  ?  ? ?  ? ? ?Pertinent Labs:   ?Lab Results  ?Component Value Date  ? HGBA1C 8.5 (H) 10/24/2020  ? ?Lab Results  ?Component Value Date  ? CHOL 120 10/24/2020  ? HDL 55.80 10/24/2020  ? LDLCALC 53 10/24/2020  ? TRIG 57.0 10/24/2020  ? CHOLHDL 2 10/24/2020  ? ?Lab Results  ?Component Value Date  ? CREATININE 0.93 10/24/2020  ? BUN 16 10/24/2020  ? NA 139 10/24/2020  ? K 4.4 10/24/2020  ? CL 103 10/24/2020  ? CO2 27 10/24/2020  ? ? ?SDOH:  (Social Determinants of Health) assessments and interventions performed:  ?SDOH Interventions   ? ?Flowsheet Row Most Recent Value  ?SDOH Interventions   ?Financial Strain Interventions Other (Comment)  [navigating savings card programs]  ? ?  ? ? ?CCM Care Plan ? ?Review of patient past medical history, allergies, medications, health status, including review of consultants reports, laboratory and other test data, was performed as part of comprehensive evaluation and provision of chronic care management services.  ? ?Care Plan : Medication Management  ?Updates made by 10/26/2020, RPH-CPP since 04/17/2021 12:00 AM  ?Completed 04/17/2021  ? ?Problem: Diabetes Resolved 04/17/2021  ?  ? ?Long-Range Goal: Disease Progression Prevention Completed 04/17/2021  ?Recent Progress: On track  ?Priority: High  ?Note:   ?  Current Barriers:  ?Unable to independently afford treatment regimen ?Unable to achieve control of diabetes  ? ?Pharmacist Clinical Goal(s):  ?Over the next 90 days, patient will verbalize ability to afford treatment regimen. ?Over the next 90 days, patient will achieve control of diabetes s evidenced by improvement in A1c through collaboration with PharmD and provider ? ?Interventions: ?1:1 collaboration with Dale Bradford [place], MD regarding  development and update of comprehensive plan of care as evidenced by provider attestation and co-signature ?Inter-disciplinary care team collaboration (see longitudinal plan of care) ?Comprehensive medication review performed; medication list updated in electronic medical record ? ?Diabetes: ?Uncontrolled; current treatment: metformin XR 1000 mg BID, Mounjaro 2.5 mg weekly- used free 28 day supply coupon  ?Hx SGLT2 - patient tolerated, but copay was too high on insurance due to high deductible health plan ?Attempted Rybelsus - copay on insurance was too high, even with savings card ?PA for [facility] was denied. Insurance will not cover if she hasn't tried and failed other SGLT2 options.  ?Current glucose readings: not checking recently ?Medicare plan should go into effect in June, when patient turns 88. May be able to continue GLP1 at that time (even if Ozempic is preferred). Encouraged to utilize community resources to help choose appropriate Medicare plans in light of her medications.  ?Reviewed goal A1c, goal fasting, and goal 2 hour post prandial glucose. Encouraged periodic checks. ?Patients notes she will be able to pay the $92 copay this month to continue Mounjaro 2.5 mg, but we will try to secure samples to tide patient over until new insurance plans.  ? ?Hypertension, hx SVT ?Controlled per last office visit; current treatment: losartan/HCTZ 100/12.5 mg daily, metoprolol tartrate 25 mg BID ?Previously recommended to continue current regimen.  ? ?Hyperlipidemia and ASCVD risk reduction: ?Controlled; current treatment: rosuvastatin 5 mg daily  ?Antiplatelet regimen: aspirin 81 mg daily ?Previously recommended to continue current regimen at this time ? ?Patient Goals/Self-Care Activities ?Over the next 90 days, patient will:  ?- take medications as prescribed ?check blood glucose twice daily, document, and provide at future appointments ?collaborate with provider on medication access solutions ? ?  ?  ? ?Plan:  Closing CCM case.  ? ?[name] Feliz Beam, PharmD, [place], CPP ?Clinical Pharmacist ?Nature conservation officer at [employer] ?[phone] ? ? ? ? ? ?

## 2021-04-28 ENCOUNTER — Telehealth: Payer: Self-pay | Admitting: Internal Medicine

## 2021-04-28 NOTE — Telephone Encounter (Signed)
-----   Message from Lourena Simmonds, RPH-CPP sent at 04/17/2021  9:39 AM EST ----- ?Regarding: Mounjaro plans ?Hey,  ? ?Patient will pick up Medicare in June and should have an easier time with medication costs. For now, we agreed for her to fill Mounjaro 2.5 mg today (~$90 for a 28 day supply). By then, we should have Mounjaro samples back in stock - Olegario Messier, [place] Register with [facility] will plan to communicate with you about samples moving forward. If we can give Kristal 2 boxes of 2.5 mg samples, that will tide her over until new insurance kicks in.  ? ?If whatever plan she gets doesn't prefer Mounjaro, she can be switched to Ozempic and should qualify for patient assistance.  ? ?[name] ? ?

## 2021-05-20 NOTE — Telephone Encounter (Signed)
Pt called in requesting for refill on medication (tirzepatide [facility]) 2.5 MG/0.5ML Pen)... Pt stated that medication cost to much... Pt stated that the cost of the medication is $808... Pt was wondering if she can get samples of the medication... Pt requesting callback today...  ?

## 2021-05-20 NOTE — Telephone Encounter (Signed)
S/w - will call pharm and see if PA needed/ can be done ?

## 2021-05-21 NOTE — Telephone Encounter (Signed)
Pt calling back in to get update on PA... Pt requesting callback... Pt stated if you can call her off of doctor office number because any other number would not go through to her phone...  ?

## 2021-06-06 ENCOUNTER — Ambulatory Visit (INDEPENDENT_AMBULATORY_CARE_PROVIDER_SITE_OTHER): Payer: 59 | Admitting: Internal Medicine

## 2021-06-06 ENCOUNTER — Other Ambulatory Visit: Payer: Self-pay | Admitting: Internal Medicine

## 2021-06-06 DIAGNOSIS — G7000 Myasthenia gravis without (acute) exacerbation: Secondary | ICD-10-CM | POA: Diagnosis not present

## 2021-06-06 DIAGNOSIS — E1165 Type 2 diabetes mellitus with hyperglycemia: Secondary | ICD-10-CM

## 2021-06-06 DIAGNOSIS — I10 Essential (primary) hypertension: Secondary | ICD-10-CM | POA: Diagnosis not present

## 2021-06-06 DIAGNOSIS — D72819 Decreased white blood cell count, unspecified: Secondary | ICD-10-CM

## 2021-06-06 DIAGNOSIS — I471 Supraventricular tachycardia: Secondary | ICD-10-CM | POA: Diagnosis not present

## 2021-06-06 DIAGNOSIS — W19XXXA Unspecified fall, initial encounter: Secondary | ICD-10-CM

## 2021-06-06 DIAGNOSIS — Z1211 Encounter for screening for malignant neoplasm of colon: Secondary | ICD-10-CM

## 2021-06-06 DIAGNOSIS — B977 Papillomavirus as the cause of diseases classified elsewhere: Secondary | ICD-10-CM

## 2021-06-06 DIAGNOSIS — E7800 Pure hypercholesterolemia, unspecified: Secondary | ICD-10-CM

## 2021-06-06 NOTE — Progress Notes (Signed)
Patient ID: Amber Decker, female   DOB: 03-Feb-1956, 66 y.o.   MRN: [id number] ? ? ?Virtual Visit via telephone Note ? ?This visit type was conducted due to national recommendations for restrictions regarding the COVID-19 pandemic (e.g. social distancing).  This format is felt to be most appropriate for this patient at this time.  All issues noted in this document were discussed and addressed.  No physical exam was performed (except for noted visual exam findings with Video Visits).  ? ?I connected with Amber Decker today by telephone and verified that I am speaking with the correct person using two identifiers. ?Location patient: home ?Location provider: work ?Persons participating in the telephone visit: patient, provider ? ?The limitations, risks, security and privacy concerns of performing an evaluation and management service by telephone and the availability of in person appointments have been discussed.  It has also been discussed with the patient that there may be a patient responsible charge related to this service. The patient expressed understanding and agreed to proceed. ? ?Reason for visit: follow up appt ? ?HPI: ?Had car trouble.  Visit converted to phone visit.  Follow up regarding her blood pressure, blood sugar and cholesterol.  No chest pain.  Breathing stable.  Walking 3-4 days per week.  [place] recently.  Landed on shoulder.  No head pain, dizziness or light headedness.  Shoulder getting better.  Will notify me if persistent.  No abdominal pain.  No bowel problems reported.  Insurance not covering mounjaro.  ? ? ?ROS: See pertinent positives and negatives per HPI. ? ?Past Medical History:  ?Diagnosis Date  ? Diabetes mellitus without complication ([place]) 0263  ? Herpes   ? Hypertension   ? Myasthenia gravis ([place])   ? onset age 28's?  ? SVT (supraventricular tachycardia) ([place])   ? ? ?Past Surgical History:  ?Procedure Laterality Date  ? MYOMECTOMY    ? UTERINE FIBROID SURGERY    ? at [facility]   ? ? ?Family History  ?Problem Relation Age of Onset  ? Diabetes Mother   ? Alcohol abuse Father   ? Heart disease Father   ?     myocardial infarction  ? Breast cancer Neg Hx   ? Colon cancer Neg Hx   ? ? ?SOCIAL HX: reviewed.  ? ? ?Current Outpatient Medications:  ?  acyclovir (ZOVIRAX) 400 MG tablet, TAKE 1 TABLET (400 MG TOTAL) DAILY BY MOUTH., Disp: 90 tablet, Rfl: 1 ?  aspirin EC 81 MG tablet, Take 81 mg by mouth daily., Disp: , Rfl:  ?  lisinopril-hydrochlorothiazide (ZESTORETIC) 10-12.5 MG tablet, Take 1 tablet by mouth daily., Disp: 90 tablet, Rfl: 1 ?  metFORMIN (GLUCOPHAGE-XR) 500 MG 24 hr tablet, Take 2 tablets (1,000 mg total) by mouth in the morning and at bedtime., Disp: 360 tablet, Rfl: 1 ?  metoprolol tartrate (LOPRESSOR) 25 MG tablet, Take 1 tablet (25 mg total) by mouth 2 (two) times daily., Disp: 180 tablet, Rfl: 1 ?  Multiple Vitamins-Minerals (ALIVE WOMENS 50+ PO), Take by mouth every other day. , Disp: , Rfl:  ?  rosuvastatin (CRESTOR) 5 MG tablet, Take 1 tablet (5 mg total) by mouth daily., Disp: 90 tablet, Rfl: 1 ?  tirzepatide (MOUNJARO) 2.5 MG/0.5ML Pen, Inject 2.5 mg into the skin once a week. (Patient not taking: Reported on 06/06/2021), Disp: 2 mL, Rfl: 4 ? ?EXAM: ? ?GENERAL: alert. Sounds to be in no acute distress.  Answering questions appropriately.   ? ?PSYCH/NEURO: pleasant and cooperative, no obvious  depression or anxiety, speech and thought processing grossly intact ? ?ASSESSMENT AND PLAN: ? ?Discussed the following assessment and plan: ? ?Problem List Items Addressed This Visit   ? ? Colon cancer screening  ?  Order has been placed for referral for colonoscopy.  ? ?  ?  ? Essential hypertension, benign  ?  Continue lisinopril/hctz and metoprolol.  Blood pressure doing well.  Follow pressures.  Follow metabolic panel.  ? ?  ?  ? Fall  ?  Recent fall as outlined.  Shoulder better.  Will notify me if desires any further intervention or if persistent pain.   ? ?  ?  ? HPV in female   ?  PAP 08/23/20 - negative with negative HPV.  ? ?  ?  ? Hypercholesteremia  ?  Continue crestor.  Low cholesterol diet and exercise.  Follow lipid panel and liver function tests.   ? ?  ?  ? Leukopenia  ?  Follow cbc.  ? ?  ?  ? Myasthenia gravis ([place])  ?  Stable.  ? ?  ?  ? SVT (supraventricular tachycardia) ([place])  ?  No increased heart rate or palpitations reported.  Continue metoprolol.  Doing better.  Follow.  ? ?  ?  ? Type 2 diabetes mellitus with hyperglycemia (HCC)  ?  Not checking sugars.  Was on mounjaro and metformin. Discussed low carb diet and exercise.  Follow met b and a1c.  Unable to afford mounjaro.  See if can get pt assistance.  Follow.  ? ?  ?  ? ? ?Return for keep scheduled. ?  ?I discussed the assessment and treatment plan with the patient. The patient was provided an opportunity to ask questions and all were answered. The patient agreed with the plan and demonstrated an understanding of the instructions. ?  ?The patient was advised to call back or seek an in-person evaluation if the symptoms worsen or if the condition fails to improve as anticipated. ? ?I provided 23 minutes of non-face-to-face time during this encounter. ? ? ?Einar Pheasant, MD   ?

## 2021-06-10 ENCOUNTER — Encounter: Payer: Self-pay | Admitting: Internal Medicine

## 2021-06-10 DIAGNOSIS — W19XXXA Unspecified fall, initial encounter: Secondary | ICD-10-CM | POA: Insufficient documentation

## 2021-06-10 NOTE — Assessment & Plan Note (Signed)
Order has been placed for referral for colonoscopy.  ?

## 2021-06-10 NOTE — Assessment & Plan Note (Signed)
PAP 08/23/20 - negative with negative HPV.  ?

## 2021-06-10 NOTE — Assessment & Plan Note (Signed)
Recent fall as outlined.  Shoulder better.  Will notify me if desires any further intervention or if persistent pain.   ?

## 2021-06-10 NOTE — Assessment & Plan Note (Signed)
Stable

## 2021-06-10 NOTE — Assessment & Plan Note (Signed)
Not checking sugars.  Was on mounjaro and metformin. Discussed low carb diet and exercise.  Follow met b and a1c.  Unable to afford mounjaro.  See if can get pt assistance.  Follow.  ?

## 2021-06-10 NOTE — Assessment & Plan Note (Signed)
Continue crestor.  Low cholesterol diet and exercise. Follow lipid panel and liver function tests.   

## 2021-06-10 NOTE — Assessment & Plan Note (Signed)
Continue lisinopril/hctz and metoprolol.  Blood pressure doing well.  Follow pressures.  Follow metabolic panel.  

## 2021-06-10 NOTE — Assessment & Plan Note (Signed)
Follow cbc.  

## 2021-06-10 NOTE — Assessment & Plan Note (Signed)
No increased heart rate or palpitations reported.  Continue metoprolol.  Doing better.  Follow.  

## 2021-06-12 ENCOUNTER — Other Ambulatory Visit: Payer: Self-pay | Admitting: Internal Medicine

## 2021-06-12 DIAGNOSIS — I10 Essential (primary) hypertension: Secondary | ICD-10-CM

## 2021-07-09 ENCOUNTER — Other Ambulatory Visit (INDEPENDENT_AMBULATORY_CARE_PROVIDER_SITE_OTHER): Payer: Self-pay

## 2021-07-09 DIAGNOSIS — E7800 Pure hypercholesterolemia, unspecified: Secondary | ICD-10-CM

## 2021-07-09 DIAGNOSIS — D72819 Decreased white blood cell count, unspecified: Secondary | ICD-10-CM

## 2021-07-09 DIAGNOSIS — E1165 Type 2 diabetes mellitus with hyperglycemia: Secondary | ICD-10-CM

## 2021-07-09 LAB — CBC WITH DIFFERENTIAL/PLATELET
Basophils Absolute: 0 10*3/uL (ref 0.0–0.1)
Basophils Relative: 0.7 % (ref 0.0–3.0)
Eosinophils Absolute: 0.1 10*3/uL (ref 0.0–0.7)
Eosinophils Relative: 2.6 % (ref 0.0–5.0)
HCT: 38.1 % (ref 36.0–46.0)
Hemoglobin: 12.7 g/dL (ref 12.0–15.0)
Lymphocytes Relative: 46.5 % — ABNORMAL HIGH (ref 12.0–46.0)
Lymphs Abs: 1.5 10*3/uL (ref 0.7–4.0)
MCHC: 33.3 g/dL (ref 30.0–36.0)
MCV: 91.4 fl (ref 78.0–100.0)
Monocytes Absolute: 0.3 10*3/uL (ref 0.1–1.0)
Monocytes Relative: 10.6 % (ref 3.0–12.0)
Neutro Abs: 1.3 10*3/uL — ABNORMAL LOW (ref 1.4–7.7)
Neutrophils Relative %: 39.6 % — ABNORMAL LOW (ref 43.0–77.0)
Platelets: 156 10*3/uL (ref 150.0–400.0)
RBC: 4.17 Mil/uL (ref 3.87–5.11)
RDW: 13.8 % (ref 11.5–15.5)
WBC: 3.2 10*3/uL — ABNORMAL LOW (ref 4.0–10.5)

## 2021-07-09 LAB — BASIC METABOLIC PANEL
BUN: 16 mg/dL (ref 6–23)
CO2: 28 mEq/L (ref 19–32)
Calcium: 9.7 mg/dL (ref 8.4–10.5)
Chloride: 104 mEq/L (ref 96–112)
Creatinine, Ser: 0.89 mg/dL (ref 0.40–1.20)
GFR: 68.25 mL/min (ref >60.00)
Glucose, Bld: 186 mg/dL — ABNORMAL HIGH (ref 70–99)
Potassium: 4.1 mEq/L (ref 3.5–5.1)
Sodium: 138 mEq/L (ref 135–145)

## 2021-07-09 LAB — HEPATIC FUNCTION PANEL
ALT: 12 U/L (ref 0–35)
AST: 15 U/L (ref 0–37)
Albumin: 4.2 g/dL (ref 3.5–5.2)
Alkaline Phosphatase: 43 U/L (ref 39–117)
Bilirubin, Direct: 0.1 mg/dL (ref 0.0–0.3)
Total Bilirubin: 0.7 mg/dL (ref 0.2–1.2)
Total Protein: 6.4 g/dL (ref 6.0–8.3)

## 2021-07-09 LAB — LIPID PANEL
Cholesterol: 143 mg/dL (ref 0–200)
HDL: 71.8 mg/dL (ref >39.00)
LDL Cholesterol: 57 mg/dL (ref 0–99)
NonHDL: 71.34
Total CHOL/HDL Ratio: 2
Triglycerides: 70 mg/dL (ref 0.0–149.0)
VLDL: 14 mg/dL (ref 0.0–40.0)

## 2021-07-09 LAB — HEMOGLOBIN A1C: Hgb A1c MFr Bld: 7.3 % — ABNORMAL HIGH (ref 4.6–6.5)

## 2021-07-09 LAB — TSH: TSH: 2.04 u[IU]/mL (ref 0.35–5.50)

## 2021-07-11 ENCOUNTER — Other Ambulatory Visit: Payer: Self-pay

## 2021-07-11 ENCOUNTER — Ambulatory Visit (INDEPENDENT_AMBULATORY_CARE_PROVIDER_SITE_OTHER): Payer: Medicare Other | Admitting: Internal Medicine

## 2021-07-11 ENCOUNTER — Encounter: Payer: Self-pay | Admitting: Internal Medicine

## 2021-07-11 VITALS — BP 128/82 | HR 67 | Temp 98.9°F | Resp 16 | Ht 69.0 in | Wt 256.0 lb

## 2021-07-11 DIAGNOSIS — E1165 Type 2 diabetes mellitus with hyperglycemia: Secondary | ICD-10-CM

## 2021-07-11 DIAGNOSIS — I10 Essential (primary) hypertension: Secondary | ICD-10-CM | POA: Diagnosis not present

## 2021-07-11 DIAGNOSIS — I471 Supraventricular tachycardia: Secondary | ICD-10-CM | POA: Diagnosis not present

## 2021-07-11 DIAGNOSIS — E7800 Pure hypercholesterolemia, unspecified: Secondary | ICD-10-CM

## 2021-07-11 DIAGNOSIS — G7000 Myasthenia gravis without (acute) exacerbation: Secondary | ICD-10-CM | POA: Diagnosis not present

## 2021-07-11 DIAGNOSIS — Z1211 Encounter for screening for malignant neoplasm of colon: Secondary | ICD-10-CM

## 2021-07-11 DIAGNOSIS — R928 Other abnormal and inconclusive findings on diagnostic imaging of breast: Secondary | ICD-10-CM

## 2021-07-11 LAB — HM DIABETES FOOT EXAM

## 2021-07-11 MED ORDER — TIRZEPATIDE 2.5 MG/0.5ML ~~LOC~~ SOAJ: 2.5000 mg | SUBCUTANEOUS | 4 refills | Status: DC

## 2021-07-11 NOTE — Progress Notes (Signed)
Patient ID: Amber Decker, female   DOB: [birth year], 66 y.o.   MRN: [id number]   Subjective:    Patient ID: Amber Decker, female    DOB: 1956/01/03, 66 y.o.   MRN: [id number]   Patient here for a scheduled follow up.  [name]   HPI Here to follow up regarding diabetes, blood pressure and cholesterol.  States she has been doing relatively well.  Has adjusted diet.  Walking regularly.  No chest pain or sob reported with increased activity or exertion.  Lost weight.  Had been on mounjaro.  On metformin. Discussed cost/insurance issues.  No acid reflux.  No abdominal pain.  Bowels moving.     Past Medical History:  Diagnosis Date   Diabetes mellitus without complication ([place]) 0355   Herpes    Hypertension    Myasthenia gravis [facility])    onset age 82's?   SVT (supraventricular tachycardia) (HCC)    Past Surgical History:  Procedure Laterality Date   MYOMECTOMY     UTERINE FIBROID SURGERY     at [place]   Family History  Problem Relation Age of Onset   Diabetes Mother    Alcohol abuse Father    Heart disease Father        myocardial infarction   Breast cancer Neg Hx    Colon cancer Neg Hx    Social History   Socioeconomic History   Marital status: Divorced    Spouse name: Not on file   Number of children: 0   Years of education: Not on file   Highest education level: Not on file  Occupational History    Employer: OTHER  Tobacco Use   Smoking status: Never   Smokeless tobacco: Never  Substance and Sexual Activity   Alcohol use: Yes    Alcohol/week: 0.0 standard drinks    Comment: occasionally   Drug use: No   Sexual activity: Not on file  Other Topics Concern   Not on file  Social History Narrative   Not on file   Social Determinants of Health   Financial Resource Strain: Medium Risk   Difficulty of Paying Living Expenses: Somewhat hard  Food Insecurity: Not on file  Transportation Needs: Not on file  Physical Activity: Not on file  Stress: Not on file  Social  Connections: Not on file     Review of Systems  Constitutional:  Negative for appetite change and unexpected weight change.  HENT:  Negative for congestion and sinus pressure.   Respiratory:  Negative for cough, chest tightness and shortness of breath.   Cardiovascular:  Negative for chest pain, palpitations and leg swelling.  Gastrointestinal:  Negative for abdominal pain, diarrhea, nausea and vomiting.  Genitourinary:  Negative for difficulty urinating and dysuria.  Musculoskeletal:  Negative for joint swelling and myalgias.  Skin:  Negative for color change and rash.  Neurological:  Negative for dizziness, light-headedness and headaches.  Psychiatric/Behavioral:  Negative for agitation and dysphoric mood.       Objective:     BP 128/82 (BP Location: Left Arm, Patient Position: Sitting, Cuff Size: Large)   Pulse 67   Temp 98.9 F (37.2 C) (Temporal)   Resp 16   Ht _0  (1.753 m)   Wt 256 lb (116.1 kg)   LMP 02/21/2012   SpO2 97%   BMI 37.80 kg/m  Wt Readings from Last 3 Encounters:  07/11/21 256 lb (116.1 kg)  03/12/21 257 lb 6.4 oz (116.8 kg)  11/29/20 266 lb (  120.7 kg)    Physical Exam Vitals reviewed.  Constitutional:      General: She is not in acute distress.    Appearance: Normal appearance.  HENT:     Head: Normocephalic and atraumatic.     Right Ear: External ear normal.     Left Ear: External ear normal.  Eyes:     General: No scleral icterus.       Right eye: No discharge.        Left eye: No discharge.     Conjunctiva/sclera: Conjunctivae normal.  Neck:     Thyroid: No thyromegaly.  Cardiovascular:     Rate and Rhythm: Normal rate and regular rhythm.     Comments: Appears to have regular rhythm with frequent premature beats.  Pulmonary:     Effort: No respiratory distress.     Breath sounds: Normal breath sounds. No wheezing.  Abdominal:     General: Bowel sounds are normal.     Palpations: Abdomen is soft.     Tenderness: There is no  abdominal tenderness.  Musculoskeletal:        General: No swelling or tenderness.     Cervical back: Neck supple. No tenderness.  Lymphadenopathy:     Cervical: No cervical adenopathy.  Skin:    Findings: No erythema or rash.  Neurological:     Mental Status: She is alert.  Psychiatric:        Mood and Affect: Mood normal.        Behavior: Behavior normal.     Outpatient Encounter Medications as of 07/11/2021  Medication Sig   acyclovir (ZOVIRAX) 400 MG tablet TAKE 1 TABLET (400 MG TOTAL) DAILY BY MOUTH.   aspirin EC 81 MG tablet Take 81 mg by mouth daily.   lisinopril-hydrochlorothiazide (ZESTORETIC) 10-12.5 MG tablet TAKE 1 TABLET BY MOUTH EVERY DAY   metFORMIN (GLUCOPHAGE-XR) 500 MG 24 hr tablet Take 2 tablets (1,000 mg total) by mouth in the morning and at bedtime.   metoprolol tartrate (LOPRESSOR) 25 MG tablet Take 1 tablet (25 mg total) by mouth 2 (two) times daily.   Multiple Vitamins-Minerals (ALIVE WOMENS 50+ PO) Take by mouth every other day.    rosuvastatin (CRESTOR) 5 MG tablet Take 1 tablet (5 mg total) by mouth daily.   tirzepatide [facility]) 2.5 MG/0.5ML Pen Inject 2.5 mg into the skin once a week.   [DISCONTINUED] tirzepatide [facility]) 2.5 MG/0.5ML Pen Inject 2.5 mg into the skin once a week. (Patient not taking: Reported on 07/11/2021)   No facility-administered encounter medications on file as of 07/11/2021.     Lab Results  Component Value Date   WBC 3.2 (L) 07/09/2021   HGB 12.7 07/09/2021   HCT 38.1 07/09/2021   PLT 156.0 07/09/2021   GLUCOSE 186 (H) 07/09/2021   CHOL 143 07/09/2021   TRIG 70.0 07/09/2021   HDL 71.80 07/09/2021   LDLCALC 57 07/09/2021   ALT 12 07/09/2021   AST 15 07/09/2021   NA 138 07/09/2021   K 4.1 07/09/2021   CL 104 07/09/2021   CREATININE 0.89 07/09/2021   BUN 16 07/09/2021   CO2 28 07/09/2021   TSH 2.04 07/09/2021   HGBA1C 7.3 (H) 07/09/2021   MICROALBUR <0.7 10/24/2020       Assessment & Plan:   Problem List Items  Addressed This Visit     Abnormal mammogram    Have discussed the need for f/u mammogram.  Overdue.  Discussed radiology recommending diagnostic mammogram.  Agreed to  screening.  Mammogram has been ordered.          Colon cancer screening    Ordered placed previously for referral for colonoscopy.  F/u with pt regarding getting scheduled.         Essential hypertension, benign    Continue lisinopril/hctz and metoprolol.  Blood pressure doing well.  Follow pressures.  Follow metabolic panel.        Hypercholesteremia    Continue crestor.  Low cholesterol diet and exercise.  Follow lipid panel and liver function tests.         Relevant Orders   CBC with Differential/Platelet   Hepatic function panel   Lipid panel   Myasthenia gravis ([place])    Carries this diagnosis.  No treatment. Discuss with her regarding f/u.        SVT (supraventricular tachycardia) ([place]) - Primary    On metoprolol.  On exam, appeared to be in SR with increased premature beats.  EKG obtained to confirm.  EKG - SR with frequent PACs.  Pt currently doing well.  Exercising.  Feels good.  Continue metoprolol.         Relevant Orders   EKG 12-Lead (Completed)   Type 2 diabetes mellitus with hyperglycemia ([place])    Had been on mounjaro.  Taking metformin. Discussed low carb diet and exercise.  Follow met b and a1c. Follow up regarding mounjaro assistance.  Follow.        Relevant Medications   tirzepatide (MOUNJARO) 2.5 MG/0.5ML Pen   Other Relevant Orders   Hemoglobin Q9I   Basic metabolic panel     Einar Pheasant, MD

## 2021-07-14 ENCOUNTER — Encounter: Payer: Self-pay | Admitting: Internal Medicine

## 2021-07-14 NOTE — Assessment & Plan Note (Signed)
Had been on mounjaro.  Taking metformin. Discussed low carb diet and exercise.  Follow met b and a1c. Follow up regarding mounjaro assistance.  Follow.

## 2021-07-14 NOTE — Assessment & Plan Note (Addendum)
On metoprolol.  On exam, appeared to be in SR with increased premature beats.  EKG obtained to confirm.  EKG - SR with frequent PACs.  Pt currently doing well.  Exercising.  Feels good.  Continue metoprolol.

## 2021-07-14 NOTE — Assessment & Plan Note (Signed)
Carries this diagnosis.  No treatment. Discuss with her regarding f/u.

## 2021-07-14 NOTE — Assessment & Plan Note (Signed)
Have discussed the need for f/u mammogram.  Overdue.  Discussed radiology recommending diagnostic mammogram.  Agreed to screening.  Mammogram has been ordered.

## 2021-07-14 NOTE — Assessment & Plan Note (Signed)
Continue lisinopril/hctz and metoprolol.  Blood pressure doing well.  Follow pressures.  Follow metabolic panel.  

## 2021-07-14 NOTE — Assessment & Plan Note (Signed)
Continue crestor.  Low cholesterol diet and exercise. Follow lipid panel and liver function tests.   

## 2021-07-14 NOTE — Assessment & Plan Note (Signed)
Ordered placed previously for referral for colonoscopy.  F/u with pt regarding getting scheduled.

## 2021-07-24 ENCOUNTER — Telehealth: Payer: Self-pay | Admitting: Internal Medicine

## 2021-07-24 NOTE — Telephone Encounter (Signed)
She is overdue a colonoscopy and mammogram.  See if agreeable to schedule.  Thanks

## 2021-07-29 ENCOUNTER — Other Ambulatory Visit: Payer: Self-pay

## 2021-07-29 ENCOUNTER — Other Ambulatory Visit: Payer: Self-pay | Admitting: Internal Medicine

## 2021-07-29 DIAGNOSIS — R921 Mammographic calcification found on diagnostic imaging of breast: Secondary | ICD-10-CM

## 2021-07-29 DIAGNOSIS — Z1211 Encounter for screening for malignant neoplasm of colon: Secondary | ICD-10-CM

## 2021-07-29 DIAGNOSIS — Z1231 Encounter for screening mammogram for malignant neoplasm of breast: Secondary | ICD-10-CM

## 2021-07-29 DIAGNOSIS — R928 Other abnormal and inconclusive findings on diagnostic imaging of breast: Secondary | ICD-10-CM

## 2021-07-29 NOTE — Telephone Encounter (Signed)
Mammo sched for 7/18 at 3pm Pt advised

## 2021-07-29 NOTE — Telephone Encounter (Signed)
Pt agreeable to colonoscopy - referral placed for [facility] [place]

## 2021-07-30 ENCOUNTER — Telehealth: Payer: Self-pay

## 2021-07-30 NOTE — Telephone Encounter (Signed)
CALLED PATIENT NO ANSWER LEFT VOICEMAIL FOR A CALL BACK ? ?

## 2021-07-31 ENCOUNTER — Telehealth: Payer: Self-pay

## 2021-07-31 NOTE — Telephone Encounter (Signed)
CALLED PATIENT NO ANSWER LEFT VOICEMAIL FOR A CALL BACK ? ?

## 2021-08-01 ENCOUNTER — Telehealth: Payer: Self-pay

## 2021-08-01 NOTE — Telephone Encounter (Signed)
CALLED PATIENT NO ANSWER LEFT VOICEMAIL FOR A CALL BACK LETTER SENT 

## 2021-08-27 ENCOUNTER — Ambulatory Visit
Admission: RE | Admit: 2021-08-27 | Discharge: 2021-08-27 | Disposition: A | Discharge status: Home / Self Care | Payer: PPO | Source: Ambulatory Visit | Attending: Internal Medicine | Admitting: Internal Medicine

## 2021-08-27 DIAGNOSIS — Z1231 Encounter for screening mammogram for malignant neoplasm of breast: Secondary | ICD-10-CM | POA: Insufficient documentation

## 2021-08-27 DIAGNOSIS — R921 Mammographic calcification found on diagnostic imaging of breast: Secondary | ICD-10-CM | POA: Diagnosis not present

## 2021-08-27 DIAGNOSIS — R928 Other abnormal and inconclusive findings on diagnostic imaging of breast: Secondary | ICD-10-CM | POA: Diagnosis not present

## 2021-08-27 DIAGNOSIS — N6489 Other specified disorders of breast: Secondary | ICD-10-CM | POA: Diagnosis not present

## 2021-10-16 ENCOUNTER — Other Ambulatory Visit (INDEPENDENT_AMBULATORY_CARE_PROVIDER_SITE_OTHER): Payer: PPO

## 2021-10-16 DIAGNOSIS — E1165 Type 2 diabetes mellitus with hyperglycemia: Secondary | ICD-10-CM | POA: Diagnosis not present

## 2021-10-16 DIAGNOSIS — E7800 Pure hypercholesterolemia, unspecified: Secondary | ICD-10-CM

## 2021-10-16 LAB — LIPID PANEL
Cholesterol: 134 mg/dL (ref 0–200)
HDL: 67.4 mg/dL (ref >39.00)
LDL Cholesterol: 50 mg/dL (ref 0–99)
NonHDL: 66.26
Total CHOL/HDL Ratio: 2
Triglycerides: 81 mg/dL (ref 0.0–149.0)
VLDL: 16.2 mg/dL (ref 0.0–40.0)

## 2021-10-16 LAB — BASIC METABOLIC PANEL
BUN: 15 mg/dL (ref 6–23)
CO2: 24 mEq/L (ref 19–32)
Calcium: 9.5 mg/dL (ref 8.4–10.5)
Chloride: 104 mEq/L (ref 96–112)
Creatinine, Ser: 0.85 mg/dL (ref 0.40–1.20)
GFR: 71.98 mL/min (ref >60.00)
Glucose, Bld: 279 mg/dL — ABNORMAL HIGH (ref 70–99)
Potassium: 4.2 mEq/L (ref 3.5–5.1)
Sodium: 135 mEq/L (ref 135–145)

## 2021-10-16 LAB — HEPATIC FUNCTION PANEL
ALT: 11 U/L (ref 0–35)
AST: 14 U/L (ref 0–37)
Albumin: 3.9 g/dL (ref 3.5–5.2)
Alkaline Phosphatase: 50 U/L (ref 39–117)
Bilirubin, Direct: 0.1 mg/dL (ref 0.0–0.3)
Total Bilirubin: 0.6 mg/dL (ref 0.2–1.2)
Total Protein: 7 g/dL (ref 6.0–8.3)

## 2021-10-16 LAB — CBC WITH DIFFERENTIAL/PLATELET
Basophils Absolute: 0 10*3/uL (ref 0.0–0.1)
Basophils Relative: 1 % (ref 0.0–3.0)
Eosinophils Absolute: 0.1 10*3/uL (ref 0.0–0.7)
Eosinophils Relative: 2.6 % (ref 0.0–5.0)
HCT: 39.8 % (ref 36.0–46.0)
Hemoglobin: 13.2 g/dL (ref 12.0–15.0)
Lymphocytes Relative: 53.2 % — ABNORMAL HIGH (ref 12.0–46.0)
Lymphs Abs: 1.7 10*3/uL (ref 0.7–4.0)
MCHC: 33.2 g/dL (ref 30.0–36.0)
MCV: 89.6 fl (ref 78.0–100.0)
Monocytes Absolute: 0.3 10*3/uL (ref 0.1–1.0)
Monocytes Relative: 10.1 % (ref 3.0–12.0)
Neutro Abs: 1.1 10*3/uL — ABNORMAL LOW (ref 1.4–7.7)
Neutrophils Relative %: 33.1 % — ABNORMAL LOW (ref 43.0–77.0)
Platelets: 142 10*3/uL — ABNORMAL LOW (ref 150.0–400.0)
RBC: 4.44 Mil/uL (ref 3.87–5.11)
RDW: 13.8 % (ref 11.5–15.5)
WBC: 3.2 10*3/uL — ABNORMAL LOW (ref 4.0–10.5)

## 2021-10-16 LAB — HEMOGLOBIN A1C: Hgb A1c MFr Bld: 13.5 % — ABNORMAL HIGH (ref 4.6–6.5)

## 2021-10-18 ENCOUNTER — Ambulatory Visit (INDEPENDENT_AMBULATORY_CARE_PROVIDER_SITE_OTHER): Payer: PPO | Admitting: Internal Medicine

## 2021-10-18 ENCOUNTER — Encounter: Payer: Self-pay | Admitting: Internal Medicine

## 2021-10-18 VITALS — BP 116/70 | HR 70 | Temp 97.9°F | Ht 69.0 in | Wt 260.2 lb

## 2021-10-18 DIAGNOSIS — I471 Supraventricular tachycardia: Secondary | ICD-10-CM

## 2021-10-18 DIAGNOSIS — E7800 Pure hypercholesterolemia, unspecified: Secondary | ICD-10-CM | POA: Diagnosis not present

## 2021-10-18 DIAGNOSIS — I10 Essential (primary) hypertension: Secondary | ICD-10-CM

## 2021-10-18 DIAGNOSIS — E1165 Type 2 diabetes mellitus with hyperglycemia: Secondary | ICD-10-CM

## 2021-10-18 DIAGNOSIS — Z23 Encounter for immunization: Secondary | ICD-10-CM | POA: Diagnosis not present

## 2021-10-18 DIAGNOSIS — G7000 Myasthenia gravis without (acute) exacerbation: Secondary | ICD-10-CM

## 2021-10-18 MED ORDER — FREESTYLE LIBRE 2 SENSOR MISC: 1.0000 | 6 refills | Status: DC

## 2021-10-18 MED ORDER — TRESIBA FLEXTOUCH 200 UNIT/ML ~~LOC~~ SOPN: 10.0000 [IU] | PEN_INJECTOR | Freq: Every day | SUBCUTANEOUS | 2 refills | Status: DC

## 2021-10-18 MED ORDER — PEN NEEDLES 32G X 5 MM MISC: 1.0000 | Freq: Every day | 3 refills | Status: DC

## 2021-10-18 NOTE — Progress Notes (Unsigned)
Patient ID: Amber Decker, female   DOB: 1955/12/06, 66 y.o.   MRN: [id number]   Subjective:    Patient ID: Amber Decker, female    DOB: 06/23/1955, 66 y.o.   MRN: [id number]   Patient here for  Chief Complaint  Patient presents with   Annual Exam    No complaints of pain or concerns.    [name]   HPI    Past Medical History:  Diagnosis Date   Diabetes mellitus without complication (HCC) 2008   Herpes    Hypertension    Myasthenia gravis [facility])    onset age 69's?   SVT (supraventricular tachycardia) (HCC)    Past Surgical History:  Procedure Laterality Date   MYOMECTOMY     UTERINE FIBROID SURGERY     at [facility]   Family History  Problem Relation Age of Onset   Diabetes Mother    Alcohol abuse Father    Heart disease Father        myocardial infarction   Breast cancer Neg Hx    Colon cancer Neg Hx    Social History   Socioeconomic History   Marital status: Divorced    Spouse name: Not on file   Number of children: 0   Years of education: Not on file   Highest education level: Not on file  Occupational History    Employer: OTHER  Tobacco Use   Smoking status: Never   Smokeless tobacco: Never  Substance and Sexual Activity   Alcohol use: Yes    Alcohol/week: 0.0 standard drinks of alcohol    Comment: occasionally   Drug use: No   Sexual activity: Not on file  Other Topics Concern   Not on file  Social History Narrative   Not on file   Social Determinants of Health   Financial Resource Strain: Medium Risk (04/17/2021)   Overall Financial Resource Strain (CARDIA)    Difficulty of Paying Living Expenses: Somewhat hard  Food Insecurity: Not on file  Transportation Needs: Not on file  Physical Activity: Not on file  Stress: Not on file  Social Connections: Not on file     Review of Systems     Objective:     BP 116/70 (BP Location: Left Arm, Patient Position: Sitting, Cuff Size: Large)   Pulse 70   Temp 97.9 F (36.6 C) (Oral)   Ht 5\' 9"  (1.753  m)   Wt 260 lb 3.2 oz (118 kg)   LMP 02/21/2012   SpO2 97%   BMI 38.42 kg/m  Wt Readings from Last 3 Encounters:  10/18/21 260 lb 3.2 oz (118 kg)  07/11/21 256 lb (116.1 kg)  03/12/21 257 lb 6.4 oz (116.8 kg)    Physical Exam   Outpatient Encounter Medications as of 10/18/2021  Medication Sig   acyclovir (ZOVIRAX) 400 MG tablet TAKE 1 TABLET (400 MG TOTAL) DAILY BY MOUTH.   aspirin EC 81 MG tablet Take 81 mg by mouth daily.   lisinopril-hydrochlorothiazide (ZESTORETIC) 10-12.5 MG tablet TAKE 1 TABLET BY MOUTH EVERY DAY   metFORMIN (GLUCOPHAGE-XR) 500 MG 24 hr tablet Take 2 tablets (1,000 mg total) by mouth in the morning and at bedtime.   metoprolol tartrate (LOPRESSOR) 25 MG tablet Take 1 tablet (25 mg total) by mouth 2 (two) times daily.   Multiple Vitamins-Minerals (ALIVE WOMENS 50+ PO) Take by mouth every other day.    rosuvastatin (CRESTOR) 5 MG tablet Take 1 tablet (5 mg total) by mouth daily.  tirzepatide [facility]) 2.5 MG/0.5ML Pen Inject 2.5 mg into the skin once a week.   No facility-administered encounter medications on file as of 10/18/2021.     Lab Results  Component Value Date   WBC 3.2 (L) 10/16/2021   HGB 13.2 10/16/2021   HCT 39.8 10/16/2021   PLT 142.0 (L) 10/16/2021   GLUCOSE 279 (H) 10/16/2021   CHOL 134 10/16/2021   TRIG 81.0 10/16/2021   HDL 67.40 10/16/2021   LDLCALC 50 10/16/2021   ALT 11 10/16/2021   AST 14 10/16/2021   NA 135 10/16/2021   K 4.2 10/16/2021   CL 104 10/16/2021   CREATININE 0.85 10/16/2021   BUN 15 10/16/2021   CO2 24 10/16/2021   TSH 2.04 07/09/2021   HGBA1C 13.5 (H) 10/16/2021   MICROALBUR <0.7 10/24/2020    MM DIAG BREAST TOMO BILATERAL  Result Date: 08/27/2021 CLINICAL DATA:  BI-RADS 3 follow-up of RIGHT breast calcifications and a LEFT breast asymmetry. Callback was performed 07/07/2019 with diagnostic workup completed April 10, 2020. EXAM: DIGITAL DIAGNOSTIC BILATERAL MAMMOGRAM WITH TOMOSYNTHESIS AND CAD TECHNIQUE:  Bilateral digital diagnostic mammography and breast tomosynthesis was performed. The images were evaluated with computer-aided detection. COMPARISON:  Previous exam(s). ACR Breast Density Category b: There are scattered areas of fibroglandular density. FINDINGS: Spot magnification views of the RIGHT breast demonstrate progressive coarsening of 2 adjacent groups of similar appearing calcifications in the RIGHT upper outer breast at middle to posterior depth. These are morphologically similar in appearance to several scattered dystrophic calcifications noted in the LEFT breast. The larger group measures 3 mm while the smaller group measures 1-2 mm. These are favored to reflect early dystrophic calcifications. No new suspicious findings are noted in the RIGHT breast. Diagnostic images of the LEFT breast demonstrate stable mammographic appearance of an asymmetry in the LEFT outer breast at posterior depth. It is best seen on spot CC slice 55 and is favored to be similar in appearance dating back to 2018. IMPRESSION: 1. Progressive coarsening of probably benign RIGHT breast calcifications which are favored to reflect early dystrophic calcifications. Recommend follow-up diagnostic mammogram in 1 year. This will establish over 2 years of definitive stability with spot magnification technique. 2. Stable probably benign LEFT breast asymmetry. Recommend follow-up diagnostic mammogram in 1 year. This will establish 2 years of definitive stability. RECOMMENDATION: Bilateral diagnostic mammogram (with RIGHT and LEFT breast ultrasound as deemed necessary) in 1 year. I have discussed the findings and recommendations with the patient. If applicable, a reminder letter will be sent to the patient regarding the next appointment. BI-RADS CATEGORY  3: Probably benign. Electronically Signed   By: Meda Klinefelter M.D.   On: 08/27/2021 16:37      Assessment & Plan:   Problem List Items Addressed This Visit   None    Dale Higgston, MD

## 2021-10-20 ENCOUNTER — Encounter: Payer: Self-pay | Admitting: Internal Medicine

## 2021-10-20 NOTE — Assessment & Plan Note (Signed)
Continue crestor.  Low cholesterol diet and exercise. Follow lipid panel and liver function tests.   

## 2021-10-20 NOTE — Assessment & Plan Note (Signed)
Recent a1c 13.5.  Discussed with her today.  Discussed the need for low carb diet and exercise.  Off mounjaro.  Taking metformin.  Start tresiba 10 units per day.  Instructed on how to administer insulin.  Also CGM placed.  Sample given.  rx sent in to pharmacy.  Send in blood sugar readings.  Titrate insulin.  Follow sugars, met b and a1c. Discussed the need for eye exam.

## 2021-10-20 NOTE — Assessment & Plan Note (Signed)
Continue lisinopril/hctz and metoprolol.  Blood pressure doing well.  Follow pressures.  Follow metabolic panel.

## 2021-10-20 NOTE — Assessment & Plan Note (Signed)
Continue metoprolol.  Stable.  

## 2021-10-20 NOTE — Assessment & Plan Note (Signed)
Stable.  No symptoms.  No further intervention needed at this time.  

## 2021-10-24 ENCOUNTER — Other Ambulatory Visit: Payer: Self-pay | Admitting: Internal Medicine

## 2021-10-24 DIAGNOSIS — E1165 Type 2 diabetes mellitus with hyperglycemia: Secondary | ICD-10-CM

## 2021-10-26 ENCOUNTER — Other Ambulatory Visit: Payer: Self-pay | Admitting: Internal Medicine

## 2021-10-26 DIAGNOSIS — I10 Essential (primary) hypertension: Secondary | ICD-10-CM

## 2021-11-13 ENCOUNTER — Encounter: Payer: Self-pay | Admitting: Internal Medicine

## 2021-11-14 ENCOUNTER — Ambulatory Visit: Payer: PPO | Admitting: Internal Medicine

## 2021-11-18 ENCOUNTER — Ambulatory Visit (INDEPENDENT_AMBULATORY_CARE_PROVIDER_SITE_OTHER): Payer: PPO | Admitting: Internal Medicine

## 2021-11-18 DIAGNOSIS — I10 Essential (primary) hypertension: Secondary | ICD-10-CM | POA: Diagnosis not present

## 2021-11-18 DIAGNOSIS — E7800 Pure hypercholesterolemia, unspecified: Secondary | ICD-10-CM | POA: Diagnosis not present

## 2021-11-18 DIAGNOSIS — E1165 Type 2 diabetes mellitus with hyperglycemia: Secondary | ICD-10-CM

## 2021-11-18 NOTE — Progress Notes (Deleted)
Patient ID: Amber Decker, female   DOB: Apr 15, 1955, 66 y.o.   MRN: [id number]   Subjective:    Patient ID: Amber Decker, female    DOB: 07-31-1955, 66 y.o.   MRN: [id number]  This visit occurred during the SARS-CoV-2 public health emergency.  Safety protocols were in place, including screening questions prior to the visit, additional usage of staff PPE, and extensive cleaning of exam room while observing appropriate contact time as indicated for disinfecting solutions.   Patient here for  Chief Complaint  Patient presents with   Follow-up    4 week follow up   .   HPI    Past Medical History:  Diagnosis Date   Diabetes mellitus without complication (HCC) 2008   Herpes    Hypertension    Myasthenia gravis [facility])    onset age 14's?   SVT (supraventricular tachycardia) (HCC)    Past Surgical History:  Procedure Laterality Date   MYOMECTOMY     UTERINE FIBROID SURGERY     at [facility]   Family History  Problem Relation Age of Onset   Diabetes Mother    Alcohol abuse Father    Heart disease Father        myocardial infarction   Breast cancer Neg Hx    Colon cancer Neg Hx    Social History   Socioeconomic History   Marital status: Divorced    Spouse name: Not on file   Number of children: 0   Years of education: Not on file   Highest education level: Not on file  Occupational History    Employer: OTHER  Tobacco Use   Smoking status: Never   Smokeless tobacco: Never  Substance and Sexual Activity   Alcohol use: Yes    Alcohol/week: 0.0 standard drinks of alcohol    Comment: occasionally   Drug use: No   Sexual activity: Not on file  Other Topics Concern   Not on file  Social History Narrative   Not on file   Social Determinants of Health   Financial Resource Strain: Medium Risk (04/17/2021)   Overall Financial Resource Strain (CARDIA)    Difficulty of Paying Living Expenses: Somewhat hard  Food Insecurity: Not on file  Transportation Needs: Not on file   Physical Activity: Not on file  Stress: Not on file  Social Connections: Not on file     Review of Systems     Objective:     LMP 02/21/2012  Wt Readings from Last 3 Encounters:  10/18/21 260 lb 3.2 oz (118 kg)  07/11/21 256 lb (116.1 kg)  03/12/21 257 lb 6.4 oz (116.8 kg)    Physical Exam   Outpatient Encounter Medications as of 11/18/2021  Medication Sig   acyclovir (ZOVIRAX) 400 MG tablet TAKE 1 TABLET (400 MG TOTAL) DAILY BY MOUTH.   aspirin EC 81 MG tablet Take 81 mg by mouth daily.   Continuous Blood Gluc Sensor (FREESTYLE LIBRE 2 SENSOR) MISC 1 Application by Does not apply route every 14 (fourteen) days.   insulin degludec (TRESIBA FLEXTOUCH) 200 UNIT/ML FlexTouch Pen Inject 10 Units into the skin daily.   Insulin Pen Needle (PEN NEEDLES) 32G X 5 MM MISC 1 Application by Does not apply route daily.   lisinopril-hydrochlorothiazide (ZESTORETIC) 10-12.5 MG tablet TAKE 1 TABLET BY MOUTH EVERY DAY   metFORMIN (GLUCOPHAGE-XR) 500 MG 24 hr tablet TAKE 2 TABLETS BY MOUTH IN THE MORNING AND 2 TABLETS AT BEDTIME   metoprolol tartrate (LOPRESSOR) 25  MG tablet Take 1 tablet by mouth twice daily   rosuvastatin (CRESTOR) 5 MG tablet Take 1 tablet (5 mg total) by mouth daily.   Multiple Vitamins-Minerals (ALIVE WOMENS 50+ PO) Take by mouth every other day.  (Patient not taking: Reported on 11/18/2021)   tirzepatide [facility]) 2.5 MG/0.5ML Pen Inject 2.5 mg into the skin once a week. (Patient not taking: Reported on 11/18/2021)   No facility-administered encounter medications on file as of 11/18/2021.     Lab Results  Component Value Date   WBC 3.2 (L) 10/16/2021   HGB 13.2 10/16/2021   HCT 39.8 10/16/2021   PLT 142.0 (L) 10/16/2021   GLUCOSE 279 (H) 10/16/2021   CHOL 134 10/16/2021   TRIG 81.0 10/16/2021   HDL 67.40 10/16/2021   LDLCALC 50 10/16/2021   ALT 11 10/16/2021   AST 14 10/16/2021   NA 135 10/16/2021   K 4.2 10/16/2021   CL 104 10/16/2021   CREATININE 0.85  10/16/2021   BUN 15 10/16/2021   CO2 24 10/16/2021   TSH 2.04 07/09/2021   HGBA1C 13.5 (H) 10/16/2021   MICROALBUR <0.7 10/24/2020    MM DIAG BREAST TOMO BILATERAL  Result Date: 08/27/2021 CLINICAL DATA:  BI-RADS 3 follow-up of RIGHT breast calcifications and a LEFT breast asymmetry. Callback was performed 07/07/2019 with diagnostic workup completed April 10, 2020. EXAM: DIGITAL DIAGNOSTIC BILATERAL MAMMOGRAM WITH TOMOSYNTHESIS AND CAD TECHNIQUE: Bilateral digital diagnostic mammography and breast tomosynthesis was performed. The images were evaluated with computer-aided detection. COMPARISON:  Previous exam(s). ACR Breast Density Category b: There are scattered areas of fibroglandular density. FINDINGS: Spot magnification views of the RIGHT breast demonstrate progressive coarsening of 2 adjacent groups of similar appearing calcifications in the RIGHT upper outer breast at middle to posterior depth. These are morphologically similar in appearance to several scattered dystrophic calcifications noted in the LEFT breast. The larger group measures 3 mm while the smaller group measures 1-2 mm. These are favored to reflect early dystrophic calcifications. No new suspicious findings are noted in the RIGHT breast. Diagnostic images of the LEFT breast demonstrate stable mammographic appearance of an asymmetry in the LEFT outer breast at posterior depth. It is best seen on spot CC slice 55 and is favored to be similar in appearance dating back to 2018. IMPRESSION: 1. Progressive coarsening of probably benign RIGHT breast calcifications which are favored to reflect early dystrophic calcifications. Recommend follow-up diagnostic mammogram in 1 year. This will establish over 2 years of definitive stability with spot magnification technique. 2. Stable probably benign LEFT breast asymmetry. Recommend follow-up diagnostic mammogram in 1 year. This will establish 2 years of definitive stability. RECOMMENDATION: Bilateral  diagnostic mammogram (with RIGHT and LEFT breast ultrasound as deemed necessary) in 1 year. I have discussed the findings and recommendations with the patient. If applicable, a reminder letter will be sent to the patient regarding the next appointment. BI-RADS CATEGORY  3: Probably benign. Electronically Signed   By: Valentino Saxon M.D.   On: 08/27/2021 16:37      Assessment & Plan:   Problem List Items Addressed This Visit   None    Amber Pheasant, MD

## 2021-11-24 ENCOUNTER — Encounter: Payer: Self-pay | Admitting: Internal Medicine

## 2021-11-24 NOTE — Assessment & Plan Note (Signed)
Continue crestor.  Low cholesterol diet and exercise. Follow lipid panel and liver function tests.   

## 2021-11-24 NOTE — Assessment & Plan Note (Signed)
Continue lisinopril/hctz and metoprolol.  Blood pressure has been doing well.  Follow pressures.  Follow metabolic panel.  

## 2021-11-24 NOTE — Progress Notes (Signed)
Patient ID: Amber Decker, female   DOB: [birth year], 66 y.o.   MRN: [id number]   Virtual Visit via telephone Note  All issues noted in this document were discussed and addressed.  No physical exam was performed (except for noted visual exam findings with Video Visits).   I connected with Amber Decker today by telephone and verified that I am speaking with the correct person using two identifiers. Location patient: home Location provider: work  Persons participating in the telephone visit: patient, provider  The limitations, risks, security and privacy concerns of performing an evaluation and management service by telephone and the availability of in person appointments have been discussed.  It has also been discussed with the patient that there may be a patient responsible charge related to this service. The patient expressed understanding and agreed to proceed.   Reason for visit: follow up appt.   HPI: Follow up regarding her diabetes.  Recently found to have elevated a1c 13.5.  since finding out about her labs and last visit (after discussion about diabetes, diet and exercise), she has adjusted her diet.  Sugars have improved.  Reviewed CGM report - target range 84% with time CGM active 64%.  Most recent sugars checks (over this past week) 130s - 150s.  Has been on tresiba.  Would like to transition to oral medication.  Tyler Aas is costly. Did well with mounjaro and would like to restart. No chest pain.  Breathing stable.  No increased cough or congestion.    ROS: See pertinent positives and negatives per HPI.  Past Medical History:  Diagnosis Date   Diabetes mellitus without complication ([place]) 2010   Herpes    Hypertension    Myasthenia gravis [facility])    onset age 66's?   SVT (supraventricular tachycardia) (HCC)     Past Surgical History:  Procedure Laterality Date   MYOMECTOMY     UTERINE FIBROID SURGERY     at [facility]    Family History  Problem Relation Age of Onset    Diabetes Mother    Alcohol abuse Father    Heart disease Father        myocardial infarction   Breast cancer Neg Hx    Colon cancer Neg Hx     SOCIAL HX: reviewed.    Current Outpatient Medications:    acyclovir (ZOVIRAX) 400 MG tablet, TAKE 1 TABLET (400 MG TOTAL) DAILY BY MOUTH., Disp: 90 tablet, Rfl: 1   aspirin EC 81 MG tablet, Take 81 mg by mouth daily., Disp: , Rfl:    Continuous Blood Gluc Sensor (FREESTYLE LIBRE 2 SENSOR) MISC, 1 Application by Does not apply route every 14 (fourteen) days., Disp: 2 each, Rfl: 6   insulin degludec (TRESIBA FLEXTOUCH) 200 UNIT/ML FlexTouch Pen, Inject 10 Units into the skin daily., Disp: 3 mL, Rfl: 2   Insulin Pen Needle (PEN NEEDLES) 32G X 5 MM MISC, 1 Application by Does not apply route daily., Disp: 90 each, Rfl: 3   lisinopril-hydrochlorothiazide (ZESTORETIC) 10-12.5 MG tablet, TAKE 1 TABLET BY MOUTH EVERY DAY, Disp: 90 tablet, Rfl: 1   metFORMIN (GLUCOPHAGE-XR) 500 MG 24 hr tablet, TAKE 2 TABLETS BY MOUTH IN THE MORNING AND 2 TABLETS AT BEDTIME, Disp: 360 tablet, Rfl: 0   metoprolol tartrate (LOPRESSOR) 25 MG tablet, Take 1 tablet by mouth twice daily, Disp: 180 tablet, Rfl: 0   rosuvastatin (CRESTOR) 5 MG tablet, Take 1 tablet (5 mg total) by mouth daily., Disp: 90 tablet, Rfl: 1   tirzepatide (  MOUNJARO) 2.5 MG/0.5ML Pen, Inject 2.5 mg into the skin once a week. (Patient not taking: Reported on 11/18/2021), Disp: 2 mL, Rfl: 4  EXAM:  GENERAL: alert. Sounds to be in no acute distress.  Answering questions appropriately.    PSYCH/NEURO: pleasant and cooperative, no obvious depression or anxiety, speech and thought processing grossly intact  ASSESSMENT AND PLAN:  Discussed the following assessment and plan:  Problem List Items Addressed This Visit     Essential hypertension, benign    Continue lisinopril/hctz and metoprolol.  Blood pressure has been doing well.  Follow pressures.  Follow metabolic panel.       Hypercholesteremia     Continue crestor.  Low cholesterol diet and exercise.  Follow lipid panel and liver function tests.        Relevant Orders   CBC with Differential/Platelet   Hepatic function panel   Lipid panel   Type 2 diabetes mellitus with hyperglycemia (HCC)    Recent a1c 13.5.  Placed on tresiba.  Taking metformin.  Has made significant changes in her diet.  Is exercising.  Sugars reviewed and improved as outlined.  Desires to come off tresiba.  Did well with mounjaro and would like to restart.  Discussed stopping tresiba and starting mounjaro.  Follow sugars.  Send in readings over the next couple of weeks.  Low carb diet and exercise.  Follow met b and a1c.       Relevant Orders   Hemoglobin K0U   Basic metabolic panel   Microalbumin / creatinine urine ratio    Return in about 2 months (around 01/18/2022) for follow up appt (44min) .   I discussed the assessment and treatment plan with the patient. The patient was provided an opportunity to ask questions and all were answered. The patient agreed with the plan and demonstrated an understanding of the instructions.   The patient was advised to call back or seek an in-person evaluation if the symptoms worsen or if the condition fails to improve as anticipated.  I provided 23 minutes of non-face-to-face time during this encounter.   Einar Pheasant, MD

## 2021-11-24 NOTE — Assessment & Plan Note (Signed)
Recent a1c 13.5.  Placed on tresiba.  Taking metformin.  Has made significant changes in her diet.  Is exercising.  Sugars reviewed and improved as outlined.  Desires to come off tresiba.  Did well with mounjaro and would like to restart.  Discussed stopping tresiba and starting mounjaro.  Follow sugars.  Send in readings over the next couple of weeks.  Low carb diet and exercise.  Follow met b and a1c.

## 2021-11-25 ENCOUNTER — Telehealth: Payer: Self-pay | Admitting: Pharmacist

## 2021-11-25 DIAGNOSIS — E1165 Type 2 diabetes mellitus with hyperglycemia: Secondary | ICD-10-CM

## 2021-11-25 NOTE — Progress Notes (Signed)
Patient appearing on list of [facility] patients with uncontrolled diabetes. Previously received referral from PCP to work with patient for medication management, but will place new referral for tracking purposes.   Contacted patient, scheduled follow up phone call next week.   Routing to PCP for FYI.   [name] Hedwig Morton, PharmD, [facility] [phone]

## 2021-12-02 ENCOUNTER — Telehealth: Payer: Self-pay | Admitting: Internal Medicine

## 2021-12-02 NOTE — Telephone Encounter (Signed)
Called patient to schedule a follow up in Jan. And fasting labs before appointment, Unable to lm on vm.

## 2021-12-04 ENCOUNTER — Other Ambulatory Visit: Payer: PPO | Admitting: Pharmacist

## 2021-12-04 DIAGNOSIS — E1165 Type 2 diabetes mellitus with hyperglycemia: Secondary | ICD-10-CM

## 2021-12-04 MED ORDER — TIRZEPATIDE 2.5 MG/0.5ML ~~LOC~~ SOAJ: 2.5000 mg | SUBCUTANEOUS | 2 refills | Status: DC

## 2021-12-04 MED ORDER — FREESTYLE LIBRE 3 SENSOR MISC: 3 refills | Status: DC

## 2021-12-04 NOTE — Patient Instructions (Signed)
[place],   It was great talking to you today!  As previously discussed, start Mounjaro 2.5 mg weekly this weekend and stop [place].   I've sent the script for the [place] 3 sensor. Download the "[place] 3 app".   Take care!  [name] Hedwig Morton, PharmD, [facility] [phone]

## 2021-12-04 NOTE — Progress Notes (Signed)
12/04/2021 Name: Amber Decker MRN: [id number] DOB: 07/14/1955  Chief Complaint  Patient presents with   Medication Management   Diabetes   Hypertension   Hyperlipidemia    Amber Decker is a 66 y.o. year old female who presented for a telephone visit.   They were referred to the pharmacist by their PCP for assistance in managing diabetes.   Subjective:  Care Team: Primary Care Provider: Einar Pheasant, MD ; Next Scheduled Visit: needs to schedule  Medication Access/Adherence  Current Pharmacy:  [facility] [address] (N), [place] - [place]) [place] [address] Phone: [phone] Fax: [phone]  [id number] - Closed - [place], [address] [place] [address] Phone: [phone] Fax: [phone]  [id number] - [place], [place] - [address] [place] [address] Phone: [phone] Fax: [place] [address] Phone: [phone] Fax: [phone]   Patient reports affordability concerns with their medications: No  Patient reports access/transportation concerns to their pharmacy: No  Patient reports adherence concerns with their medications:  No     Diabetes:  Current medications: metformin XR 1000 mg twice daily, Tresiba 10 units daily, plans to start Mounjaro 2.5 mg weekly on Saturday  Date of Download: 10/12-10/25/23 % Time CGM is active: 42% Average Glucose: 121 mg/dL Glucose Management Indicator:   Glucose Variability: 19.6 (goal <36%) Time in Goal:  - Time in range 70-180: 98% - Time above range: 2% - Time below range: 0%  Patient denies hypoglycemic s/sx including dizziness, shakiness, sweating. Patient denies hyperglycemic symptoms including polyuria, polydipsia, polyphagia, nocturia, neuropathy, blurred vision.  Current meal  patterns:  - Breakfast: low carb tortilla with egg, sausage or bacon; coffee with zero sugar creamer - Lunch: salad, ham, shredded cheese, mostly vegetables on it - Supper: meat and vegetable; occasionally gets chinese hot and sour soup  Current physical activity: walks at least 3 days a week -2 miles, 45-50 minutes   Hypertension:  Current medications: lisinopril/HCTZ 10/12.5 mg daily   Patient does not have a validated, automated, upper arm home BP cuff; she will call her insurance   Hyperlipidemia/ASCVD Risk Reduction  Current lipid lowering medications: rosuvastatin 5 mg daily  Antiplatelet regimen: aspirin 81   Health Maintenance  Health Maintenance Due  Topic Date Due   Medicare Annual Wellness (AWV)  Never done   OPHTHALMOLOGY EXAM  Never done   HIV Screening  Never done   Hepatitis C Screening  Never done   Zoster Vaccines- Shingrix (1 of 2) Never done   COVID-19 Vaccine (4 - Pfizer series) 01/23/2020   Pneumonia Vaccine 83+ Years old (1 - PCV) Never done   DEXA SCAN  Never done   Diabetic kidney evaluation - Urine ACR  10/24/2021     Objective: Lab Results  Component Value Date   HGBA1C 13.5 (H) 10/16/2021    Lab Results  Component Value Date   CREATININE 0.85 10/16/2021   BUN 15 10/16/2021   NA 135 10/16/2021   K 4.2 10/16/2021   CL 104 10/16/2021   CO2 24 10/16/2021    Lab Results  Component Value Date   CHOL 134 10/16/2021   HDL 67.40 10/16/2021   LDLCALC 50 10/16/2021   TRIG 81.0 10/16/2021   CHOLHDL 2 10/16/2021    Medications Reviewed Today  Reviewed by Alden Hipp, RPH-CPP (Pharmacist) on 12/04/21 at 1453  Med List Status: <None>   Medication Order Taking? Sig Documenting Provider Last Dose Status Informant  acyclovir (ZOVIRAX) 400 MG tablet [id number] Yes TAKE 1 TABLET (400 MG TOTAL) DAILY BY MOUTH. Dale Delle[place], MD Taking Active   aspirin EC 81 MG tablet [id number] Yes Take 81 mg by mouth daily. [provider]  Taking Active   Continuous Blood Gluc Sensor (FREESTYLE LIBRE 2 SENSOR) MISC [id number] Yes 1 Application by Does not apply route every 14 (fourteen) days. Dale [place], MD Taking Active   [facility] MELATONIN PO [id number] Yes Take 1 each by mouth daily as needed. [provider] Taking Active   insulin degludec (TRESIBA FLEXTOUCH) 200 UNIT/ML FlexTouch Pen [id number] Yes Inject 10 Units into the skin daily. Dale [place], MD Taking Active   Insulin Pen Needle (PEN NEEDLES) 32G X 5 MM MISC [id number] Yes 1 Application by Does not apply route daily. Dale [place], MD Taking Active   lisinopril-hydrochlorothiazide (ZESTORETIC) 10-12.5 MG tablet [id number] Yes TAKE 1 TABLET BY MOUTH EVERY DAY Dale [place], MD Taking Active   Magnesium 250 MG TABS [id number] Yes Take 1 tablet by mouth daily. [provider] Taking Active   metFORMIN (GLUCOPHAGE-XR) 500 MG 24 hr tablet [id number] Yes TAKE 2 TABLETS BY MOUTH IN THE MORNING AND 2 TABLETS AT BEDTIME Dale [place], MD Taking Active   metoprolol tartrate (LOPRESSOR) 25 MG tablet [id number] Yes Take 1 tablet by mouth twice daily Dale [place], MD Taking Active   rosuvastatin (CRESTOR) 5 MG tablet [id number] Yes Take 1 tablet (5 mg total) by mouth daily. Dale [place], MD Taking Active   tirzepatide [facility]) 2.5 MG/0.5ML Pen [id number] No Inject 2.5 mg into the skin once a week.  Patient not taking: Reported on 11/18/2021   Dale [place], MD Not Taking Active   TURMERIC PO [id number] Yes Take 1 each by mouth daily. [provider] Taking Active               Assessment/Plan:   Diabetes: - Currently uncontrolled but improving - Reviewed long term cardiovascular and renal outcomes of uncontrolled blood sugar - Reviewed goal A1c, goal fasting, and goal 2 hour post prandial glucose - Reviewed dietary modifications including: praised for focus on reducing carbohydrate intake - Reviewed lifestyle modifications including:  praised for attaining goal 150 minutes moderate intensity exercise weekly - Agree to stop [place], start Mounjaro this week.  - Discussed Libre 3 and lack of need to remember to scan. Patient agreeable to new script.    Hypertension: - Currently controlled - Reviewed appropriate blood pressure monitoring technique and reviewed goal blood pressure. Recommended to check home blood pressure and heart rate periodically. She will talk to her insurance about over the counter benefits - Recommend to continue current regimen at this time   Hyperlipidemia/ASCVD Risk Reduction: - Currently controlled.  - Recommend to continue current regimen at this time   Follow Up Plan: phone call in 4 weeks  [name] TClearance Coots, PharmD, [facility] [phone]

## 2022-01-06 ENCOUNTER — Other Ambulatory Visit: Payer: Self-pay | Admitting: Internal Medicine

## 2022-01-06 ENCOUNTER — Other Ambulatory Visit: Payer: PPO | Admitting: Pharmacist

## 2022-01-06 DIAGNOSIS — E1165 Type 2 diabetes mellitus with hyperglycemia: Secondary | ICD-10-CM

## 2022-01-06 DIAGNOSIS — E7800 Pure hypercholesterolemia, unspecified: Secondary | ICD-10-CM

## 2022-01-06 MED ORDER — ACYCLOVIR 400 MG PO TABS: 400.0000 mg | ORAL_TABLET | Freq: Every day | ORAL | 1 refills | Status: DC

## 2022-01-06 MED ORDER — ROSUVASTATIN CALCIUM 5 MG PO TABS: 5.0000 mg | ORAL_TABLET | Freq: Every day | ORAL | 1 refills | Status: DC

## 2022-01-06 MED ORDER — TIRZEPATIDE 5 MG/0.5ML ~~LOC~~ SOAJ: 5.0000 mg | SUBCUTANEOUS | 1 refills | Status: DC

## 2022-01-06 MED ORDER — METFORMIN HCL ER 500 MG PO TB24: 1000.0000 mg | ORAL_TABLET | Freq: Two times a day (BID) | ORAL | 1 refills | Status: DC

## 2022-01-06 NOTE — Progress Notes (Signed)
01/06/2022 Name: Amber Decker MRN: [id number] DOB: Jun 27, 1955  Chief Complaint  Patient presents with   Medication Management   Diabetes   Hypertension   Hyperlipidemia    Amber Decker is a 66 y.o. year old female who presented for a telephone visit.   They were referred to the pharmacist by their PCP for assistance in managing diabetes.   Subjective:  Care Team: Primary Care Provider: Einar Pheasant, MD ; Next Scheduled Visit: none scheduled   Medication Access/Adherence  Current Pharmacy:  [facility] [address] (N), [place] - [address] [place]) [place] [address] Phone: [phone] Fax: [phone]  Patient reports affordability concerns with their medications: No  Patient reports access/transportation concerns to their pharmacy: No  Patient reports adherence concerns with their medications:  No     Diabetes:  Current medications: Mounjaro 2.5 mg weekly, metformin XR 1000 mg twice daily  Date of Download: 11/7-11/20/23 % Time CGM is active: 61% Average Glucose: 118 mg/dL Glucose Management Indicator: 6.1  Glucose Variability: 18 (goal <36%) Time in Goal:  - Time in range 70-180: 1% - Time above range: 99% - Time below range: 0%  Patient denies hypoglycemic s/sx including dizziness, shakiness, sweating. Patient denies hyperglycemic symptoms including polyuria, polydipsia, polyphagia, nocturia, neuropathy, blurred vision.  Hypertension:  Current medications: lisinopril/HCTZ 10/12.5 mg daily   Hyperlipidemia/ASCVD Risk Reduction  Current lipid lowering medications: rosuvastatin 5 mg daily   Health Maintenance  Health Maintenance Due  Topic Date Due   Medicare Annual Wellness (AWV)  Never done   Pneumonia Vaccine 23+ Years old (1 - PCV) Never done   OPHTHALMOLOGY EXAM  Never done   HIV Screening  Never done   Hepatitis C Screening  Never done   Zoster Vaccines- Shingrix (1 of 2) Never  done   DEXA SCAN  Never done   COVID-19 Vaccine (4 - 2023-24 season) 10/11/2021   Diabetic kidney evaluation - Urine ACR  10/24/2021     Objective: Lab Results  Component Value Date   HGBA1C 13.5 (H) 10/16/2021    Lab Results  Component Value Date   CREATININE 0.85 10/16/2021   BUN 15 10/16/2021   NA 135 10/16/2021   K 4.2 10/16/2021   CL 104 10/16/2021   CO2 24 10/16/2021    Lab Results  Component Value Date   CHOL 134 10/16/2021   HDL 67.40 10/16/2021   LDLCALC 50 10/16/2021   TRIG 81.0 10/16/2021   CHOLHDL 2 10/16/2021    Medications Reviewed Today     Reviewed by Osker Mason, RPH-CPP (Pharmacist) on 01/06/22 at [place] List Status: <None>   Medication Order Taking? Sig Documenting Provider Last Dose Status Informant  acyclovir (ZOVIRAX) 400 MG tablet [id number] Yes TAKE 1 TABLET (400 MG TOTAL) DAILY BY MOUTH. Einar Pheasant, MD Taking Active   aspirin EC 81 MG tablet [id number] Yes Take 81 mg by mouth daily. [provider] Taking Active   Continuous Blood Gluc Sensor (FREESTYLE LIBRE 3 SENSOR) [place] [id number] Yes Place 1 sensor on the skin every 14 days. Use to check glucose continuously Einar Pheasant, MD Taking Active   [facility] MELATONIN PO [id number]  Take 1 each by mouth daily as needed. [provider]  Active   Insulin Pen Needle (PEN NEEDLES) 32G X 5 MM MISC [id number]  1 Application by Does not apply route daily. Einar Pheasant, MD  Active   lisinopril-hydrochlorothiazide (ZESTORETIC) 10-12.5 MG tablet [id number] Yes  TAKE 1 TABLET BY MOUTH EVERY DAY Dale [place], MD Taking Active   Magnesium 250 MG TABS [id number]  Take 1 tablet by mouth daily. [provider]  Active   metFORMIN (GLUCOPHAGE-XR) 500 MG 24 hr tablet [id number] Yes TAKE 2 TABLETS BY MOUTH IN THE MORNING AND 2 TABLETS AT BEDTIME Dale [place], MD Taking Active   metoprolol tartrate (LOPRESSOR) 25 MG tablet [id number] Yes Take 1 tablet by mouth twice daily Dale [place], MD Taking Active   rosuvastatin (CRESTOR) 5 MG tablet [id number] Yes Take 1 tablet (5 mg total) by mouth daily. Dale [place], MD Taking Active   tirzepatide [facility]) 2.5 MG/0.5ML Pen [id number] Yes Inject 2.5 mg into the skin once a week. Dale [place], MD Taking Active   TURMERIC PO [id number]  Take 1 each by mouth daily. [provider]  Active               Assessment/Plan:   Diabetes: - Currently improving per CGM readings - Reviewed long term cardiovascular and renal outcomes of uncontrolled blood sugar - Reviewed goal A1c, goal fasting, and goal 2 hour post prandial glucose - Recommend to increase Mounjaro to 5 mg weekly once patient completes current supply. Continue metformin XR 1000 mg twice daily - Recommend to check glucose using CGM  Hypertension: - Currently controlled - Recommend to continue current regimen at this time   Hyperlipidemia/ASCVD Risk Reduction: - Currently controlled.  - Recommend to continue current regimen at this time    Patient requests 90 day supply of acyclovir. Will collaborate with PCP on that refill. Assisted in scheduling fasting labs and PCP visit in next available for PCP.   Follow Up Plan: phone call in ~ 6 weeks.  [name] Eppie Gibson, PharmD, BCACP, CPP [facility] [phone]

## 2022-01-06 NOTE — Progress Notes (Signed)
Rx sent in for acyclovir.

## 2022-01-23 ENCOUNTER — Other Ambulatory Visit: Payer: Self-pay | Admitting: Internal Medicine

## 2022-01-23 DIAGNOSIS — I10 Essential (primary) hypertension: Secondary | ICD-10-CM

## 2022-02-17 ENCOUNTER — Other Ambulatory Visit: Payer: PPO | Admitting: Pharmacist

## 2022-02-17 NOTE — Progress Notes (Signed)
02/17/2022 Name: Amber Decker MRN: [id number] DOB: 1955-09-04  Chief Complaint  Patient presents with   Medication Management   Diabetes    Amber Decker is a 67 y.o. year old female who presented for a telephone visit.   They were referred to the pharmacist by their PCP for assistance in managing diabetes.   Subjective:  Care Team: Primary Care Provider: Dale [place], MD ; Next Scheduled Visit: 03/13/22  Medication Access/Adherence  Current Pharmacy:  [facility] [address] ([address]), [place] - [address] [place] ([address]) [place] [address] Phone: [phone] Fax: [phone]  [facility] [phone] - Closed - [place], [place] - [address] [place] [address] Phone: [phone] Fax: [phone]  [facility] [address] [place] - [address] [place] [address] Phone: [phone] Fax: [phone]  [facility] [address] [place] [address] Phone: [phone] Fax: [phone]   Patient reports affordability concerns with their medications: No  Patient reports access/transportation concerns to their pharmacy: No  Patient reports adherence concerns with their medications:  No     Diabetes:  Current medications: Mounjaro 5 mg weekly, metformin XR 1000 mg twice daily  Current glucose readings: using Libre 3, but most recent readings are not connecting appropriately. Reports readings continue to be within range the majority of the time. Will work to reconnect.   Denies any constipation or GI upset.   Objective: Lab Results  Component Value Date   HGBA1C 13.5 (H) 10/16/2021    Lab Results  Component Value Date   CREATININE 0.85 10/16/2021   BUN 15 10/16/2021   NA 135 10/16/2021   K 4.2 10/16/2021   CL 104 10/16/2021   CO2 24 10/16/2021    Lab Results  Component Value Date   CHOL 134 10/16/2021   HDL 67.40  10/16/2021   LDLCALC 50 10/16/2021   TRIG 81.0 10/16/2021   CHOLHDL 2 10/16/2021    Medications Reviewed Today     Reviewed by Alden Hipp, RPH-CPP (Pharmacist) on 02/17/22 at 1551  Med List Status: <None>   Medication Order Taking? Sig Documenting Provider Last Dose Status Informant  acyclovir (ZOVIRAX) 400 MG tablet [id number] Yes Take 1 tablet (400 mg total) by mouth daily. Dale [place], MD Taking Active   aspirin EC 81 MG tablet [id number] Yes Take 81 mg by mouth daily. [provider] Taking Active   Continuous Blood Gluc Sensor (FREESTYLE LIBRE 3 SENSOR) [place] [id number] Yes Place 1 sensor on the skin every 14 days. Use to check glucose continuously Dale [place], MD Taking Active   [facility] MELATONIN PO [id number] Yes Take 1 each by mouth daily as needed. [provider] Taking Active   lisinopril-hydrochlorothiazide (ZESTORETIC) 10-12.5 MG tablet [id number] Yes TAKE 1 TABLET BY MOUTH EVERY DAY Dale [place], MD Taking Active   Magnesium 250 MG TABS [id number] Yes Take 1 tablet by mouth daily. [provider] Taking Active   metFORMIN (GLUCOPHAGE-XR) 500 MG 24 hr tablet [id number] Yes Take 2 tablets (1,000 mg total) by mouth 2 (two) times daily with a meal. Dale [place], MD Taking Active   metoprolol tartrate (LOPRESSOR) 25 MG tablet [id number] Yes Take 1 tablet by mouth twice daily Dale [place], MD Taking Active   rosuvastatin (CRESTOR) 5 MG tablet [id number] Yes Take 1 tablet (5 mg total) by mouth daily. Dale , MD Taking Active   tirzepatide (  MOUNJARO) 5 MG/0.5ML Pen [id number] Yes Inject 5 mg into the skin once a week. Einar Pheasant, MD Taking Active   TURMERIC PO [id number] Yes Take 1 each by mouth daily. [provider] Taking Active               Assessment/Plan:   Diabetes: - Currently controlled per CGM readings.  - Recommend to continue current regimen at this time. PCP visit with labs in ~ 4 weeks   Follow Up  Plan: phone call in ~ 8 weeks  [name] Hedwig Morton, PharmD, [facility] [phone]

## 2022-02-18 NOTE — Patient Instructions (Signed)
[place],   Keep up the amazing work!  [name] Hedwig Morton, PharmD, [place], [facility] [phone]

## 2022-03-03 ENCOUNTER — Other Ambulatory Visit (HOSPITAL_COMMUNITY): Payer: Self-pay

## 2022-03-07 ENCOUNTER — Encounter: Payer: Self-pay | Admitting: Pharmacist

## 2022-03-11 ENCOUNTER — Other Ambulatory Visit (INDEPENDENT_AMBULATORY_CARE_PROVIDER_SITE_OTHER): Payer: PPO

## 2022-03-11 DIAGNOSIS — E7800 Pure hypercholesterolemia, unspecified: Secondary | ICD-10-CM

## 2022-03-11 DIAGNOSIS — E1165 Type 2 diabetes mellitus with hyperglycemia: Secondary | ICD-10-CM | POA: Diagnosis not present

## 2022-03-11 LAB — LIPID PANEL
Cholesterol: 150 mg/dL (ref 0–200)
HDL: 69.7 mg/dL (ref >39.00)
LDL Cholesterol: 65 mg/dL (ref 0–99)
NonHDL: 80.43
Total CHOL/HDL Ratio: 2
Triglycerides: 78 mg/dL (ref 0.0–149.0)
VLDL: 15.6 mg/dL (ref 0.0–40.0)

## 2022-03-11 LAB — HEPATIC FUNCTION PANEL
ALT: 12 U/L (ref 0–35)
AST: 16 U/L (ref 0–37)
Albumin: 4.6 g/dL (ref 3.5–5.2)
Alkaline Phosphatase: 46 U/L (ref 39–117)
Bilirubin, Direct: 0.1 mg/dL (ref 0.0–0.3)
Total Bilirubin: 0.6 mg/dL (ref 0.2–1.2)
Total Protein: 7.4 g/dL (ref 6.0–8.3)

## 2022-03-11 LAB — CBC WITH DIFFERENTIAL/PLATELET
Basophils Absolute: 0 10*3/uL (ref 0.0–0.1)
Basophils Relative: 0.8 % (ref 0.0–3.0)
Eosinophils Absolute: 0.1 10*3/uL (ref 0.0–0.7)
Eosinophils Relative: 2.6 % (ref 0.0–5.0)
HCT: 40.2 % (ref 36.0–46.0)
Hemoglobin: 13.7 g/dL (ref 12.0–15.0)
Lymphocytes Relative: 44.6 % (ref 12.0–46.0)
Lymphs Abs: 1.5 10*3/uL (ref 0.7–4.0)
MCHC: 34.2 g/dL (ref 30.0–36.0)
MCV: 90.7 fl (ref 78.0–100.0)
Monocytes Absolute: 0.4 10*3/uL (ref 0.1–1.0)
Monocytes Relative: 10.3 % (ref 3.0–12.0)
Neutro Abs: 1.4 10*3/uL (ref 1.4–7.7)
Neutrophils Relative %: 41.7 % — ABNORMAL LOW (ref 43.0–77.0)
Platelets: 179 10*3/uL (ref 150.0–400.0)
RBC: 4.43 Mil/uL (ref 3.87–5.11)
RDW: 13.2 % (ref 11.5–15.5)
WBC: 3.5 10*3/uL — ABNORMAL LOW (ref 4.0–10.5)

## 2022-03-11 LAB — BASIC METABOLIC PANEL
BUN: 16 mg/dL (ref 6–23)
CO2: 27 mEq/L (ref 19–32)
Calcium: 10 mg/dL (ref 8.4–10.5)
Chloride: 104 mEq/L (ref 96–112)
Creatinine, Ser: 0.84 mg/dL (ref 0.40–1.20)
GFR: 72.81 mL/min (ref >60.00)
Glucose, Bld: 118 mg/dL — ABNORMAL HIGH (ref 70–99)
Potassium: 4.1 mEq/L (ref 3.5–5.1)
Sodium: 139 mEq/L (ref 135–145)

## 2022-03-11 LAB — HEMOGLOBIN A1C: Hgb A1c MFr Bld: 7 % — ABNORMAL HIGH (ref 4.6–6.5)

## 2022-03-13 ENCOUNTER — Encounter: Payer: Self-pay | Admitting: Internal Medicine

## 2022-03-13 ENCOUNTER — Ambulatory Visit (INDEPENDENT_AMBULATORY_CARE_PROVIDER_SITE_OTHER): Payer: PPO | Admitting: Internal Medicine

## 2022-03-13 VITALS — BP 112/70 | HR 80 | Temp 97.6°F | Resp 16 | Ht 69.0 in | Wt 256.6 lb

## 2022-03-13 DIAGNOSIS — I10 Essential (primary) hypertension: Secondary | ICD-10-CM

## 2022-03-13 DIAGNOSIS — Z23 Encounter for immunization: Secondary | ICD-10-CM

## 2022-03-13 DIAGNOSIS — G7000 Myasthenia gravis without (acute) exacerbation: Secondary | ICD-10-CM | POA: Diagnosis not present

## 2022-03-13 DIAGNOSIS — R928 Other abnormal and inconclusive findings on diagnostic imaging of breast: Secondary | ICD-10-CM | POA: Diagnosis not present

## 2022-03-13 DIAGNOSIS — E1165 Type 2 diabetes mellitus with hyperglycemia: Secondary | ICD-10-CM

## 2022-03-13 DIAGNOSIS — E2839 Other primary ovarian failure: Secondary | ICD-10-CM

## 2022-03-13 DIAGNOSIS — Z1211 Encounter for screening for malignant neoplasm of colon: Secondary | ICD-10-CM

## 2022-03-13 DIAGNOSIS — I471 Supraventricular tachycardia, unspecified: Secondary | ICD-10-CM

## 2022-03-13 DIAGNOSIS — D72819 Decreased white blood cell count, unspecified: Secondary | ICD-10-CM | POA: Diagnosis not present

## 2022-03-13 DIAGNOSIS — E7800 Pure hypercholesterolemia, unspecified: Secondary | ICD-10-CM

## 2022-03-13 NOTE — Progress Notes (Signed)
Subjective:    Patient ID: Amber Decker, female    DOB: November 18, 1955, 67 y.o.   MRN: [id number]  Patient here for  Chief Complaint  Patient presents with   Medical Management of Chronic Issues    HPI Here to follow up regarding hypertension, diabetes, and SVT.  She is doing relatively well.  No chest pain or sob reported.  No increased cough or congestion.  No acid reflux.  No abdominal pain.  Bowels moving.  Discussed labs.  Discussed improved A1c - 7.0.  reviewed blood sugars - target range 90% - 73% time CGM active.  She is walking three days per week.  Discussed diet and exercise.  Discussed due colonoscopy.  Agreeable for referral.     Past Medical History:  Diagnosis Date   Diabetes mellitus without complication ([place]) 7829   Herpes    Hypertension    Myasthenia gravis ([place])    onset age 7's?   SVT (supraventricular tachycardia)    Past Surgical History:  Procedure Laterality Date   MYOMECTOMY     UTERINE FIBROID SURGERY     at [place]   Family History  Problem Relation Age of Onset   Diabetes Mother    Alcohol abuse Father    Heart disease Father        myocardial infarction   Breast cancer Neg Hx    Colon cancer Neg Hx    Social History   Socioeconomic History   Marital status: Divorced    Spouse name: Not on file   Number of children: 0   Years of education: Not on file   Highest education level: Not on file  Occupational History    Employer: OTHER  Tobacco Use   Smoking status: Never   Smokeless tobacco: Never  Substance and Sexual Activity   Alcohol use: Yes    Alcohol/week: 0.0 standard drinks of alcohol    Comment: occasionally   Drug use: No   Sexual activity: Not on file  Other Topics Concern   Not on file  Social History Narrative   Not on file   Social Determinants of Health   Financial Resource Strain: Medium Risk (04/17/2021)   Overall Financial Resource Strain (CARDIA)    Difficulty of Paying Living Expenses: Somewhat hard  Food  Insecurity: Not on file  Transportation Needs: Not on file  Physical Activity: Not on file  Stress: Not on file  Social Connections: Not on file     Review of Systems  Constitutional:  Negative for appetite change and unexpected weight change.  HENT:  Negative for congestion and sinus pressure.   Respiratory:  Negative for cough, chest tightness and shortness of breath.   Cardiovascular:  Negative for chest pain, palpitations and leg swelling.  Gastrointestinal:  Negative for abdominal pain, diarrhea, nausea and vomiting.  Genitourinary:  Negative for difficulty urinating and dysuria.  Musculoskeletal:  Negative for joint swelling and myalgias.  Skin:  Negative for color change and rash.  Neurological:  Negative for dizziness and headaches.  Psychiatric/Behavioral:  Negative for agitation and dysphoric mood.        Objective:     BP 112/70   Pulse 80   Temp 97.6 F (36.4 C)   Resp 16   Ht 5\' 9"  (1.753 m)   Wt 256 lb 9.6 oz (116.4 kg)   LMP 02/21/2012   SpO2 98%   BMI 37.89 kg/m  Wt Readings from Last 3 Encounters:  03/13/22 256 lb 9.6 oz (  116.4 kg)  10/18/21 260 lb 3.2 oz (118 kg)  07/11/21 256 lb (116.1 kg)    Physical Exam Vitals reviewed.  Constitutional:      General: She is not in acute distress.    Appearance: Normal appearance.  HENT:     Head: Normocephalic and atraumatic.     Right Ear: External ear normal.     Left Ear: External ear normal.  Eyes:     General: No scleral icterus.       Right eye: No discharge.        Left eye: No discharge.     Conjunctiva/sclera: Conjunctivae normal.  Neck:     Thyroid: No thyromegaly.  Cardiovascular:     Rate and Rhythm: Normal rate and regular rhythm.  Pulmonary:     Effort: No respiratory distress.     Breath sounds: Normal breath sounds. No wheezing.  Abdominal:     General: Bowel sounds are normal.     Palpations: Abdomen is soft.     Tenderness: There is no abdominal tenderness.  Musculoskeletal:         General: No swelling or tenderness.     Cervical back: Neck supple. No tenderness.  Lymphadenopathy:     Cervical: No cervical adenopathy.  Skin:    Findings: No erythema or rash.  Neurological:     Mental Status: She is alert.  Psychiatric:        Mood and Affect: Mood normal.        Behavior: Behavior normal.      Outpatient Encounter Medications as of 03/13/2022  Medication Sig   acyclovir (ZOVIRAX) 400 MG tablet Take 1 tablet (400 mg total) by mouth daily.   aspirin EC 81 MG tablet Take 81 mg by mouth daily.   Continuous Blood Gluc Sensor (FREESTYLE LIBRE 3 SENSOR) MISC Place 1 sensor on the skin every 14 days. Use to check glucose continuously   GNP MELATONIN PO Take 1 each by mouth daily as needed.   lisinopril-hydrochlorothiazide (ZESTORETIC) 10-12.5 MG tablet TAKE 1 TABLET BY MOUTH EVERY DAY   Magnesium 250 MG TABS Take 1 tablet by mouth daily.   metFORMIN (GLUCOPHAGE-XR) 500 MG 24 hr tablet Take 2 tablets (1,000 mg total) by mouth 2 (two) times daily with a meal.   metoprolol tartrate (LOPRESSOR) 25 MG tablet Take 1 tablet by mouth twice daily   rosuvastatin (CRESTOR) 5 MG tablet Take 1 tablet (5 mg total) by mouth daily.   tirzepatide [facility]) 5 MG/0.5ML Pen Inject 5 mg into the skin once a week.   TURMERIC PO Take 1 each by mouth daily.   No facility-administered encounter medications on file as of 03/13/2022.     Lab Results  Component Value Date   WBC 3.5 (L) 03/11/2022   HGB 13.7 03/11/2022   HCT 40.2 03/11/2022   PLT 179.0 03/11/2022   GLUCOSE 118 (H) 03/11/2022   CHOL 150 03/11/2022   TRIG 78.0 03/11/2022   HDL 69.70 03/11/2022   LDLCALC 65 03/11/2022   ALT 12 03/11/2022   AST 16 03/11/2022   NA 139 03/11/2022   K 4.1 03/11/2022   CL 104 03/11/2022   CREATININE 0.84 03/11/2022   BUN 16 03/11/2022   CO2 27 03/11/2022   TSH 2.04 07/09/2021   HGBA1C 7.0 (H) 03/11/2022   MICROALBUR <0.7 10/24/2020    MM DIAG BREAST TOMO BILATERAL  Result Date:  08/27/2021 CLINICAL DATA:  BI-RADS 3 follow-up of RIGHT breast calcifications and a LEFT  breast asymmetry. Callback was performed 07/07/2019 with diagnostic workup completed April 10, 2020. EXAM: DIGITAL DIAGNOSTIC BILATERAL MAMMOGRAM WITH TOMOSYNTHESIS AND CAD TECHNIQUE: Bilateral digital diagnostic mammography and breast tomosynthesis was performed. The images were evaluated with computer-aided detection. COMPARISON:  Previous exam(s). ACR Breast Density Category b: There are scattered areas of fibroglandular density. FINDINGS: Spot magnification views of the RIGHT breast demonstrate progressive coarsening of 2 adjacent groups of similar appearing calcifications in the RIGHT upper outer breast at middle to posterior depth. These are morphologically similar in appearance to several scattered dystrophic calcifications noted in the LEFT breast. The larger group measures 3 mm while the smaller group measures 1-2 mm. These are favored to reflect early dystrophic calcifications. No new suspicious findings are noted in the RIGHT breast. Diagnostic images of the LEFT breast demonstrate stable mammographic appearance of an asymmetry in the LEFT outer breast at posterior depth. It is best seen on spot CC slice 55 and is favored to be similar in appearance dating back to 2018. IMPRESSION: 1. Progressive coarsening of probably benign RIGHT breast calcifications which are favored to reflect early dystrophic calcifications. Recommend follow-up diagnostic mammogram in 1 year. This will establish over 2 years of definitive stability with spot magnification technique. 2. Stable probably benign LEFT breast asymmetry. Recommend follow-up diagnostic mammogram in 1 year. This will establish 2 years of definitive stability. RECOMMENDATION: Bilateral diagnostic mammogram (with RIGHT and LEFT breast ultrasound as deemed necessary) in 1 year. I have discussed the findings and recommendations with the patient. If applicable, a reminder  letter will be sent to the patient regarding the next appointment. BI-RADS CATEGORY  3: Probably benign. Electronically Signed   By: Valentino Saxon M.D.   On: 08/27/2021 16:37      Assessment & Plan:  Type 2 diabetes mellitus with hyperglycemia, without long-term current use of insulin ([place]) Assessment & Plan: On mounjaro and metformin.  Has adjusted her diet.  Is exercising.  Sugars much improved.  A1c 7.0.  Low carb diet and exercise.  Follow met b and a1c.   Orders: -     Hemoglobin A1c; Future  Essential hypertension, benign Assessment & Plan: Continue lisinopril/hctz and metoprolol.  Blood pressure has been doing well.  Follow pressures.  Follow metabolic panel.   Orders: -     Basic metabolic panel; Future  Hypercholesteremia Assessment & Plan: Continue crestor.  Low cholesterol diet and exercise.  Follow lipid panel and liver function tests.    Orders: -     Lipid panel; Future -     Hepatic function panel; Future  Colon cancer screening Assessment & Plan: Overdue screening.  Order placed for GI referral.   Orders: -     Ambulatory referral to Gastroenterology  Need for pneumococcal 20-valent conjugate vaccination -     Pneumococcal conjugate vaccine 20-valent  Abnormal mammogram Assessment & Plan: Mammogram 08/27/21 - Birads III.  Recommend:  Bilateral diagnostic mammogram (with RIGHT and LEFT breast ultrasound as deemed necessary) in 1 year.   Leukopenia, unspecified type Assessment & Plan: Follow cbc.    Myasthenia gravis ([place]) Assessment & Plan: Stable.  No symptoms.  No further intervention needed at this time.    SVT (supraventricular tachycardia) Assessment & Plan: Continue metoprolol.  Stable.    Estrogen deficiency Assessment & Plan: Discussed bone density. Wants to hold.        Einar Pheasant, MD

## 2022-03-16 ENCOUNTER — Encounter: Payer: Self-pay | Admitting: Internal Medicine

## 2022-03-16 DIAGNOSIS — E2839 Other primary ovarian failure: Secondary | ICD-10-CM | POA: Insufficient documentation

## 2022-03-16 NOTE — Assessment & Plan Note (Signed)
Mammogram 08/27/21 - [facility] III.  Recommend:  Bilateral diagnostic mammogram (with RIGHT and LEFT breast ultrasound as deemed necessary) in 1 year.

## 2022-03-16 NOTE — Assessment & Plan Note (Signed)
Continue crestor.  Low cholesterol diet and exercise. Follow lipid panel and liver function tests.   

## 2022-03-16 NOTE — Addendum Note (Signed)
Addended by: Alisa Graff on: 03/16/2022 04:59 PM   Modules accepted: Level of Service

## 2022-03-16 NOTE — Assessment & Plan Note (Signed)
Stable.  No symptoms.  No further intervention needed at this time.

## 2022-03-16 NOTE — Assessment & Plan Note (Signed)
Overdue screening.  Order placed for GI referral.

## 2022-03-16 NOTE — Assessment & Plan Note (Signed)
Discussed bone density. Wants to hold.

## 2022-03-16 NOTE — Assessment & Plan Note (Signed)
Continue metoprolol.  Stable.

## 2022-03-16 NOTE — Assessment & Plan Note (Signed)
Follow cbc.  

## 2022-03-16 NOTE — Assessment & Plan Note (Signed)
Continue lisinopril/hctz and metoprolol.  Blood pressure has been doing well.  Follow pressures.  Follow metabolic panel.

## 2022-03-16 NOTE — Assessment & Plan Note (Signed)
On mounjaro and metformin.  Has adjusted her diet.  Is exercising.  Sugars much improved.  A1c 7.0.  Low carb diet and exercise.  Follow met b and a1c.

## 2022-03-18 NOTE — Addendum Note (Signed)
Addended by: Neta Ehlers on: 03/18/2022 08:26 AM   Modules accepted: Orders

## 2022-04-09 ENCOUNTER — Telehealth: Payer: Self-pay

## 2022-04-09 NOTE — Telephone Encounter (Signed)
Manus Rudd (Key: Alverda [id number]) Rx #: [id number]  Status Sent to Plan today Drug Mounjaro '5MG'$ /0.5ML pen-injectors [employer] Electronic Prior Authorization Form 2017 [place] [phone]

## 2022-04-10 ENCOUNTER — Telehealth: Payer: Self-pay

## 2022-04-10 NOTE — Telephone Encounter (Signed)
Mychart message sent to pt to advise [facility] approved

## 2022-04-14 ENCOUNTER — Other Ambulatory Visit: Payer: PPO | Admitting: Pharmacist

## 2022-04-14 DIAGNOSIS — E1165 Type 2 diabetes mellitus with hyperglycemia: Secondary | ICD-10-CM

## 2022-04-14 MED ORDER — METFORMIN HCL ER 500 MG PO TB24: ORAL_TABLET | ORAL | 1 refills | Status: DC

## 2022-04-14 NOTE — Progress Notes (Signed)
04/14/2022 Name: Amber Decker MRN: [id number] DOB: [birth year]  Chief Complaint  Patient presents with   Medication Management   Diabetes   Hypertension   Hyperlipidemia    Amber Decker is a 67 y.o. year old female who presented for a telephone visit.   They were referred to the pharmacist by their PCP for assistance in managing diabetes.    Subjective:  Care Team: Primary Care Provider: Einar Pheasant, Decker ; Next Scheduled Visit: 07/20/21  Medication Access/Adherence  Current Pharmacy:  [facility] [address] ([address]), [place] [address] [place]) [place] [address] Phone: [phone] Fax: [phone]  [id number] [place]: [phone]: [phone] [id number] [place], [place] [address]: [phone]: [address]one: [phone]: [phone] Patient reports affordability concerns with their medications: No  Patient reports access/transportation concerns to their pharmacy: No  Patient reports adherence concerns with their medications:  No     Diabetes:  Current medications: metformin XR 1000 mg twice daily, Zepbound 5 mg weekly  Current glucose readings: using Libre but unable to connect to patient portal while on the phone today. Patient notes that readings are within range 95% of the time, reports periodic alarms for readings <70, especially when exercising. She worries about whether she needs to treat with glucose. Denies symptoms when this happens.    Hypertension:  Current medications: lisinopril/HCTZ 10/12.5 mg daily, metoprolol tartrate 25 mg twice daily   Hyperlipidemia/ASCVD Risk Reduction  Current lipid lowering medications:  rosuvastatin 5 mg daily   Objective:  Lab Results  Component Value Date   HGBA1C 7.0 (H) 03/11/2022    Lab Results  Component Value Date   CREATININE 0.84 03/11/2022   BUN 16 03/11/2022   NA 139 03/11/2022   K 4.1 03/11/2022   CL 104 03/11/2022   CO2 27 03/11/2022    Lab Results  Component Value Date   CHOL 150 03/11/2022   HDL 69.70 03/11/2022   LDLCALC 65 03/11/2022   TRIG 78.0 03/11/2022   CHOLHDL 2 03/11/2022    Medications Reviewed Today     Reviewed by Amber Decker (Physician) on 03/16/22 at 16Weakleyist Status: <None>   Medication Order Taking? Sig Documenting Provider Last Dose Status Informant  acyclovir (ZOVIRAX) 400 MG tablet [id number] Take 1 tablet (400 mg total) by mouth daily. Amber Decker Taking Active   aspirin EC 81 MG tablet [id number] Take 81 mg by mouth daily. Provider, Historical, Decker Taking Active   Continuous Blood Gluc Sensor (FREESTYLE LIBRE 3 SENSOR) MISC [id number] Place 1 sensor on the skin every 14 days. Use to check glucose continuously Amber Decker Taking Active   GNVal[facility] Vista Health SystemELATONIN PO [id number] Take 1 each by mouth daily as needed. Provider, Historical, Decker Taking Active   lisinopril-hydrochlorothiazide (ZESTORETIC) 10-12.5 MG tablet [id number] TAKE 1 TABLET BY MOUTH EVERY DAY Amber Decker Taking Active   Magnesium 250 MG TABS [id number] Take 1 tablet by mouth daily. Provider, Historical, Decker Taking Active   metFORMIN (GLUCOPHAGE-XR) 500 MG 24 hr tablet [id number] Take 2 tablets (1,000 mg total) by mouth 2 (two) times daily with a meal. Amber Decker Taking Active   metoprolol tartrate (LOPRESSOR) 25 MG tablet [id number] No Take 1 tablet by mouth twice daily Amber Pheasant, Decker Taking Active   rosuvastatin (CRESTOR) 5 MG tablet [id number] No Take 1 tablet (5 mg total) by mouth daily. Amber Pheasant, Decker Taking Active   tirzepatide [facility]) 5 MG/0.5ML Pen [id number] No Inject 5 mg into the skin once a  week. Amber Pheasant, Decker Taking Active   TURMERIC PO [id number] No Take 1 each by mouth daily. Provider, Historical, Decker Taking Active               Assessment/Plan:   Diabetes: - Currently controlled - Reviewed goal A1c, goal fasting, and goal 2 hour post prandial glucose - Discussed that periodic readings in the 60s if asymptomatic are not concerning, given potency of Mounjaro. However, appropriate to reduce metformin to 1500 mg daily to see if improvement in frequency of alerts. Patient verbalizes understanding. Continue Mounjaro 5 mg weekly.  - Recommend to check glucose continuously using CGM  Hypertension: - Currently controlled - Recommend to continue current regimen at this time   Hyperlipidemia/ASCVD Risk Reduction: - Currently controlled.  - Recommend to continue current regimen at this time   Follow Up Plan: follow up with PCP as scheduled  Amber Decker, PharmD, [place], [place] [phone]

## 2022-04-14 NOTE — Patient Instructions (Signed)
Anamarie,   It was great talking to you today!  Reduce metformin to a total of 3 tablets a day and continue Mounjaro at 5 mg weekly. Please let us know if you continue to have low blood sugars <60 that cause the Roy to alarm.   Take care!  [name] Hedwig Morton, PharmD, [place], [facility] [phone]

## 2022-04-23 ENCOUNTER — Other Ambulatory Visit: Payer: Self-pay | Admitting: Internal Medicine

## 2022-04-23 DIAGNOSIS — I10 Essential (primary) hypertension: Secondary | ICD-10-CM

## 2022-05-12 IMAGING — MG DIGITAL DIAGNOSTIC BILAT W/ TOMO W/ CAD
8 of 17 series · 8 of 40 positions shown · non-contrast
Comparison: Previous exam(s).

CLINICAL DATA: Patient presents for bilateral diagnostic
examination as a call back from her screening mammogram from
07/07/2019 for which patient never returned for evaluation. She
comes back today to evaluate right breast microcalcifications and
possible left breast mass.

EXAM:
DIGITAL DIAGNOSTIC BILATERAL MAMMOGRAM WITH TOMOSYNTHESIS AND CAD;
ULTRASOUND LEFT BREAST LIMITED
TECHNIQUE: Bilateral digital diagnostic mammography and breast tomosynthesis
was performed. The images were evaluated with computer-aided
detection.; Targeted ultrasound examination of the left breast was
performed

[R CC (1 of 2)]
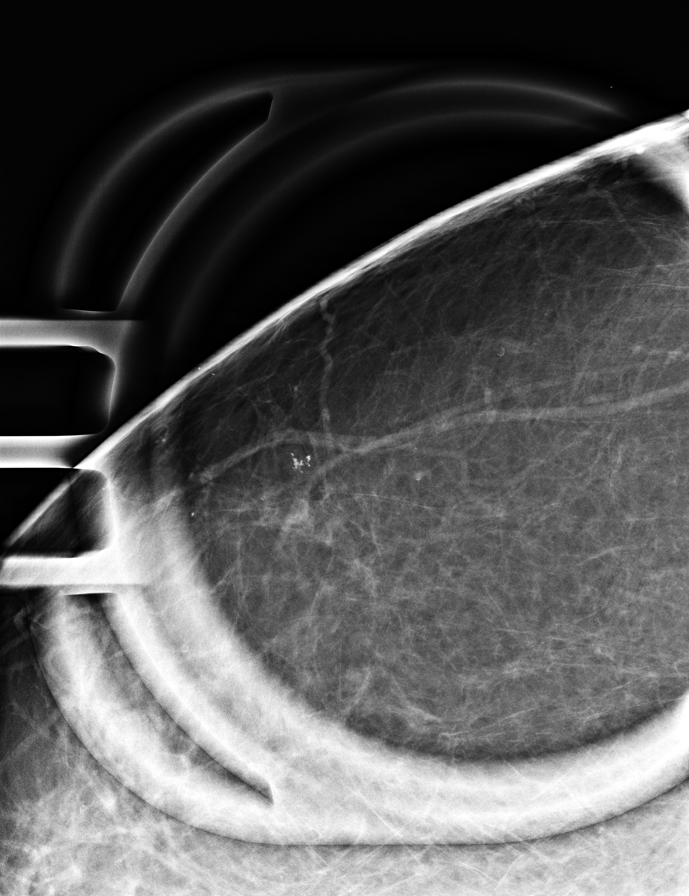

[R CC (2 of 2)]
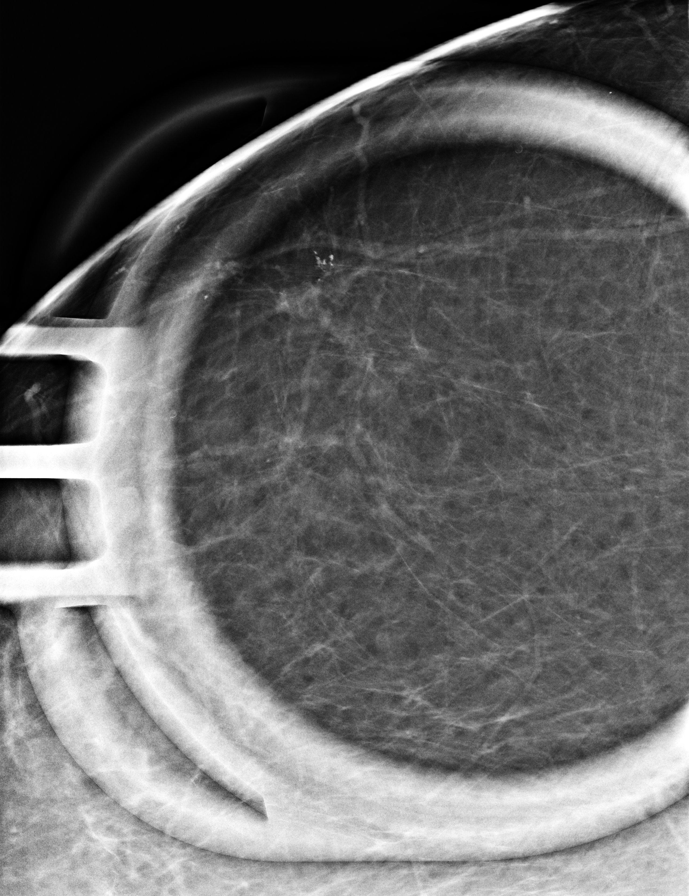

[R ML (1 of 2)]
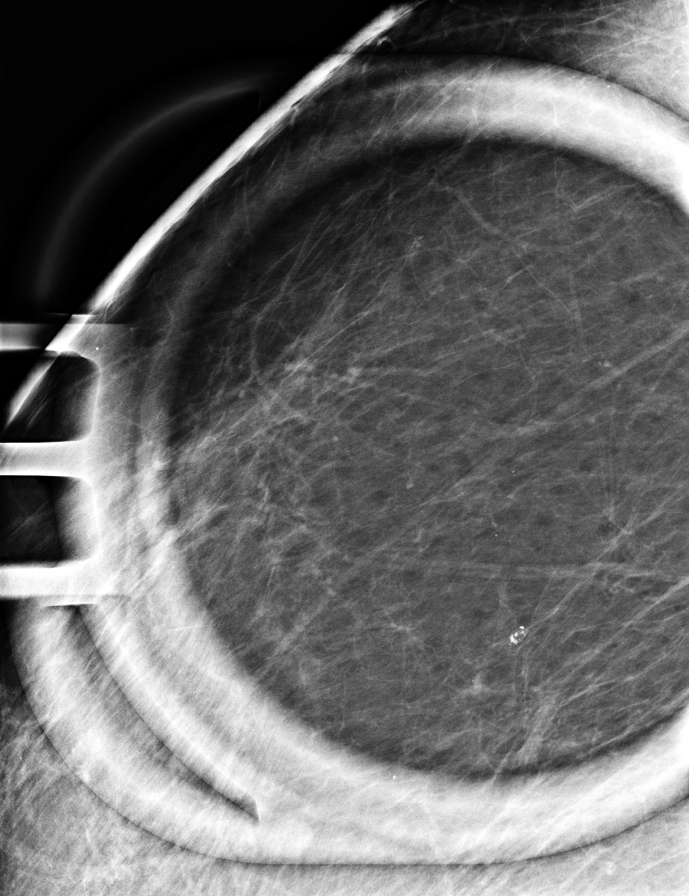

[R ML synth-2D]
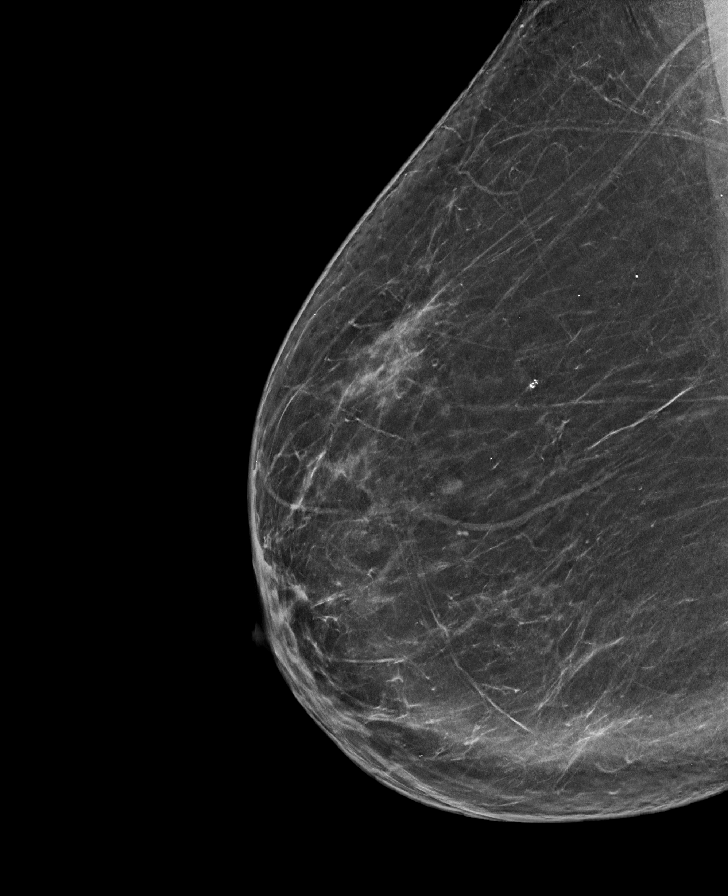

[R CC synth-2D]
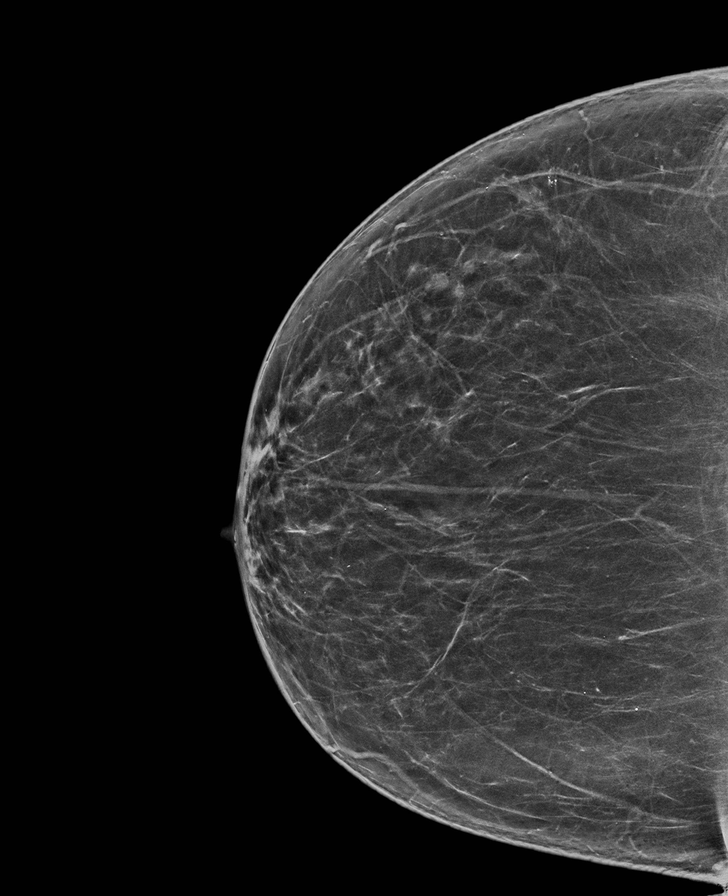

[L MLO synth-2D]
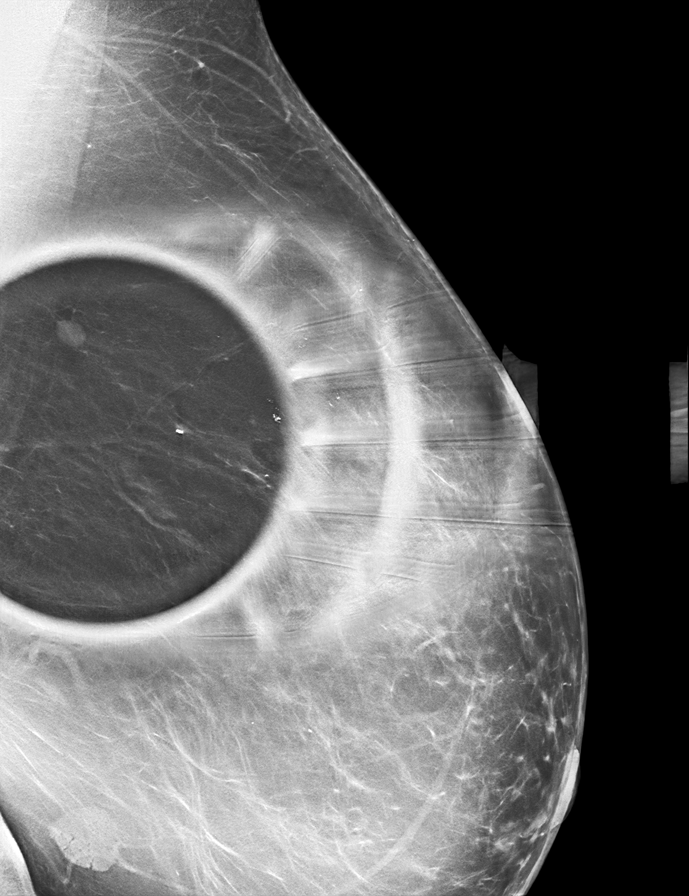

[R ML (2 of 2)]
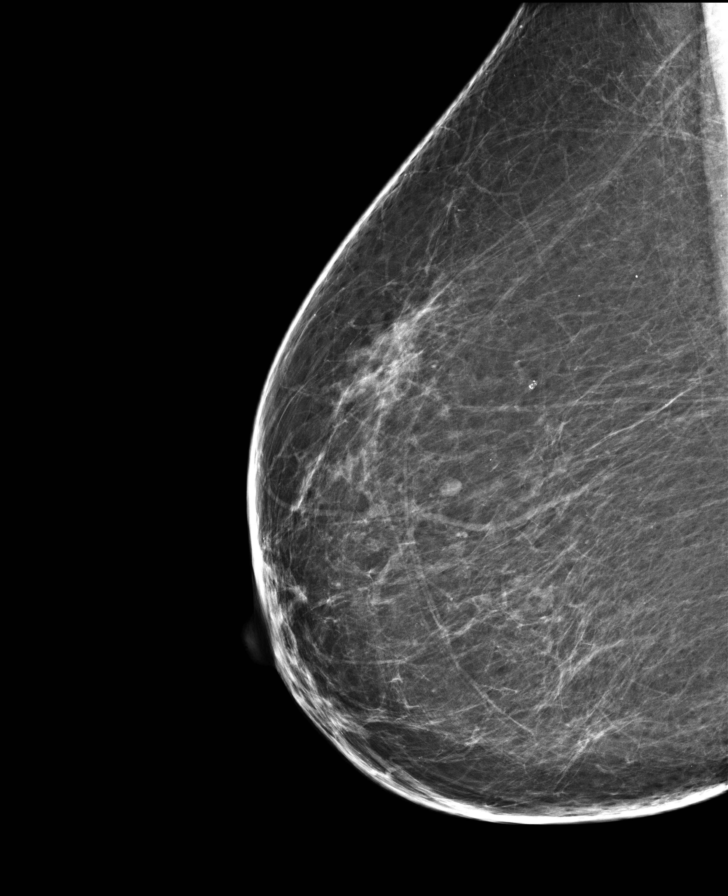

[L CC synth-2D]
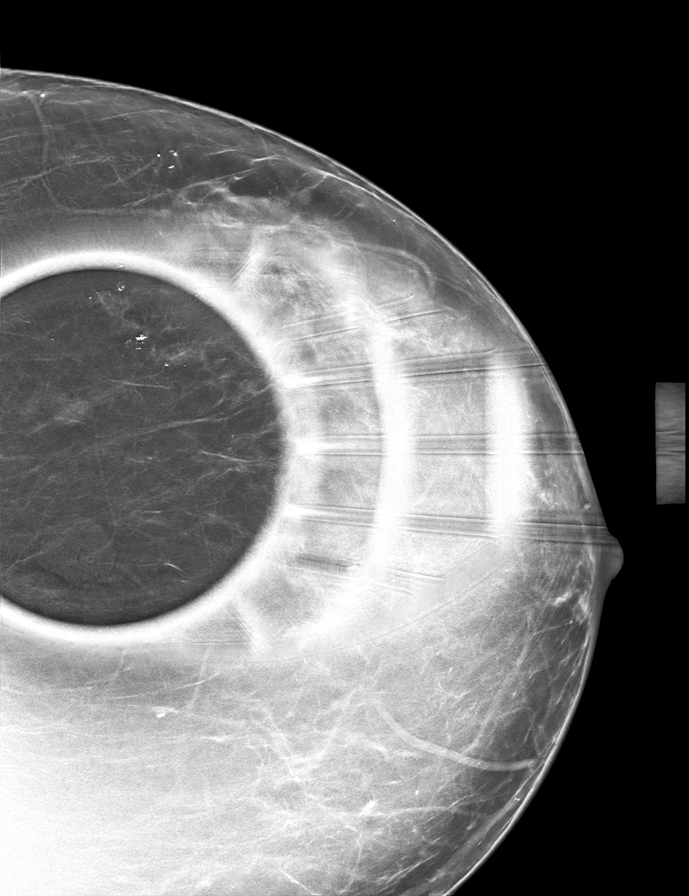

[8 of 40 positions shown; findings below may reference images not displayed]

ACR Breast Density Category b: There are scattered areas of
fibroglandular density.
FINDINGS: Examination demonstrates a circumscribed lobulated 1.5 cm mass over
the posterior third of the outer midportion of the left breast. This
is unchanged from June 2019 on the full field images. Remainder of
the left breast is unchanged.

Images of the right breast demonstrate a 3 mm group of coarse
heterogeneous microcalcifications over the upper outer right breast
also without significant change on the full field images compared to
June 2019. Remainder of the right breast is unchanged.

Targeted ultrasound is performed, showing no focal abnormality over
the outer midportion of the left breast to correlate to the
mammographic finding.
IMPRESSION: 1. Probable benign 1.5 cm mass over the outer midportion of the left
breast without sonographic correlate. Stable compared to June 2019.

2. 3 mm group of probable benign microcalcifications over the upper
outer right breast. Stable compared to June 2019.

RECOMMENDATION:
Recommend short interval follow-up bilateral diagnostic mammogram at
the time of patient's annual bilateral mammogram in June 2020 to
document continued stability.

I have discussed the findings and recommendations with the patient.
If applicable, a reminder letter will be sent to the patient
regarding the next appointment.

BI-RADS CATEGORY  3: Probably benign.

## 2022-06-05 ENCOUNTER — Other Ambulatory Visit: Payer: Self-pay | Admitting: Internal Medicine

## 2022-06-05 DIAGNOSIS — I10 Essential (primary) hypertension: Secondary | ICD-10-CM

## 2022-06-19 ENCOUNTER — Other Ambulatory Visit: Payer: Self-pay | Admitting: Internal Medicine

## 2022-06-27 LAB — HM DIABETES EYE EXAM

## 2022-07-04 ENCOUNTER — Other Ambulatory Visit: Payer: Self-pay | Admitting: Internal Medicine

## 2022-07-04 DIAGNOSIS — E7800 Pure hypercholesterolemia, unspecified: Secondary | ICD-10-CM

## 2022-07-21 ENCOUNTER — Other Ambulatory Visit: Payer: PPO

## 2022-07-22 ENCOUNTER — Other Ambulatory Visit: Payer: Self-pay | Admitting: Internal Medicine

## 2022-07-22 ENCOUNTER — Ambulatory Visit (INDEPENDENT_AMBULATORY_CARE_PROVIDER_SITE_OTHER): Payer: PPO

## 2022-07-22 VITALS — Ht 69.0 in | Wt 256.0 lb

## 2022-07-22 DIAGNOSIS — Z780 Asymptomatic menopausal state: Secondary | ICD-10-CM

## 2022-07-22 DIAGNOSIS — Z0000 Encounter for general adult medical examination without abnormal findings: Secondary | ICD-10-CM

## 2022-07-22 DIAGNOSIS — Z1211 Encounter for screening for malignant neoplasm of colon: Secondary | ICD-10-CM

## 2022-07-22 DIAGNOSIS — I10 Essential (primary) hypertension: Secondary | ICD-10-CM

## 2022-07-22 NOTE — Progress Notes (Signed)
I connected with  Amber Decker on 07/22/22 by a audio enabled telemedicine application and verified that I am speaking with the correct person using two identifiers.  Patient Location: Home  Provider Location: Office/Clinic  I discussed the limitations of evaluation and management by telemedicine. The patient expressed understanding and agreed to proceed.  Subjective:   Amber Decker is a 66 y.o. female who presents for Medicare Annual (Subsequent) preventive examination.  Review of Systems     Cardiac Risk Factors include: advanced age (>36men, >77 women);hypertension;diabetes mellitus;obesity (BMI >30kg/m2)     Objective:    Today's Vitals   07/22/22 1555  Weight: 256 lb (116.1 kg)  Height: 5\' 9"  (1.753 m)   Body mass index is 37.8 kg/m.     07/22/2022    3:38 PM  Advanced Directives  Does Patient Have a Medical Advance Directive? No  Would patient like information on creating a medical advance directive? No - Patient declined    Current Medications (verified) Outpatient Encounter Medications as of 07/22/2022  Medication Sig   acyclovir (ZOVIRAX) 400 MG tablet Take 1 tablet by mouth once daily   aspirin EC 81 MG tablet Take 81 mg by mouth daily.   Continuous Blood Gluc Sensor (FREESTYLE LIBRE 3 SENSOR) MISC Place 1 sensor on the skin every 14 days. Use to check glucose continuously   GNP MELATONIN PO Take 1 each by mouth daily as needed.   lisinopril-hydrochlorothiazide (ZESTORETIC) 10-12.5 MG tablet Take 1 tablet by mouth once daily   Magnesium 250 MG TABS Take 1 tablet by mouth daily.   metFORMIN (GLUCOPHAGE-XR) 500 MG 24 hr tablet Take a total of 3 tablets (1500 mg) daily   metoprolol tartrate (LOPRESSOR) 25 MG tablet Take 1 tablet by mouth twice daily   rosuvastatin (CRESTOR) 5 MG tablet Take 1 tablet by mouth once daily   tirzepatide (MOUNJARO) 5 MG/0.5ML Pen Inject 5 mg into the skin once a week.   TURMERIC PO Take 1 each by mouth daily.   No  facility-administered encounter medications on file as of 07/22/2022.    Allergies (verified) Erythromycin   History: Past Medical History:  Diagnosis Date   Diabetes mellitus without complication (HCC) 2008   Herpes    Hypertension    Myasthenia gravis (HCC)    onset age 65's?   SVT (supraventricular tachycardia)    Past Surgical History:  Procedure Laterality Date   MYOMECTOMY     UTERINE FIBROID SURGERY     at [facility]   Family History  Problem Relation Age of Onset   Diabetes Mother    Alcohol abuse Father    Heart disease Father        myocardial infarction   Breast cancer Neg Hx    Colon cancer Neg Hx    Social History   Socioeconomic History   Marital status: Divorced    Spouse name: Not on file   Number of children: 0   Years of education: Not on file   Highest education level: Not on file  Occupational History    Employer: OTHER  Tobacco Use   Smoking status: Never   Smokeless tobacco: Never  Substance and Sexual Activity   Alcohol use: Yes    Alcohol/week: 0.0 standard drinks of alcohol    Comment: occasionally   Drug use: No   Sexual activity: Not on file  Other Topics Concern   Not on file  Social History Narrative   Not on file   Social  Determinants of Health   Financial Resource Strain: Low Risk  (07/22/2022)   Overall Financial Resource Strain (CARDIA)    Difficulty of Paying Living Expenses: Not hard at all  Food Insecurity: No Food Insecurity (07/22/2022)   Hunger Vital Sign    Worried About Running Out of Food in the Last Year: Never true    Ran Out of Food in the Last Year: Never true  Transportation Needs: No Transportation Needs (07/22/2022)   PRAPARE - Administrator, Civil Service (Medical): No    Lack of Transportation (Non-Medical): No  Physical Activity: Sufficiently Active (07/22/2022)   Exercise Vital Sign    Days of Exercise per Week: 4 days    Minutes of Exercise per Session: 50 min  Stress: No Stress Concern  Present (07/22/2022)   [employer] of Occupational Health - Occupational Stress Questionnaire    Feeling of Stress : Not at all  Social Connections: Moderately Isolated (07/22/2022)   Social Connection and Isolation Panel [NHANES]    Frequency of Communication with Friends and Family: More than three times a week    Frequency of Social Gatherings with Friends and Family: More than three times a week    Attends Religious Services: More than 4 times per year    Active Member of [employer] or Organizations: No    Attends Engineer, structural: Never    Marital Status: Divorced    Tobacco Counseling Counseling given: Not Answered   Clinical Intake:  Pre-visit preparation completed: Yes  Pain : No/denies pain     Nutritional Risks: None Diabetes: Yes CBG done?: No Did pt. bring in CBG monitor from home?: No  How often do you need to have someone help you when you read instructions, pamphlets, or other written materials from your doctor or pharmacy?: 1 - Never  Diabetic?yes Nutrition Risk Assessment:  Has the patient had any N/V/D within the last 2 months?  No  Does the patient have any non-healing wounds?  No  Has the patient had any unintentional weight loss or weight gain?  No   Diabetes:  Is the patient diabetic?  Yes  If diabetic, was a CBG obtained today?  No  Did the patient bring in their glucometer from home?  No  How often do you monitor your CBG's? 2-3xday.   Financial Strains and Diabetes Management:  Are you having any financial strains with the device, your supplies or your medication? No .  Does the patient want to be seen by Chronic Care Management for management of their diabetes?  No  Would the patient like to be referred to a Nutritionist or for Diabetic Management?  No   Diabetic Exams:  Diabetic Eye Exam: Completed 06/27/22.  Pt has been advised about the importance in completing this exam.   Diabetic Foot Exam: Completed 07/11/21. Pt has  been advised about the importance in completing this exam.    Interpreter Needed?: No  Information entered by :: Kennedy Bucker, LPN   Activities of Daily Living    07/22/2022    3:38 PM  In your present state of health, do you have any difficulty performing the following activities:  Hearing? 0  Vision? 0  Difficulty concentrating or making decisions? 0  Walking or climbing stairs? 0  Dressing or bathing? 0  Doing errands, shopping? 0  Preparing Food and eating ? N  Using the Toilet? N  In the past six months, have you accidently leaked urine? N  Do  you have problems with loss of bowel control? N  Managing your Medications? N  Managing your Finances? N  Housekeeping or managing your Housekeeping? N    Patient Care Team: Dale Berea, MD as PCP - General (Internal Medicine) Alden Hipp, RPH-CPP (Pharmacist)  Indicate any recent Medical Services you may have received from other than [facility] providers in the past year (date may be approximate).     Assessment:   This is a routine wellness examination for Amber Decker.  Hearing/Vision screen Hearing Screening - Comments:: No aids Vision Screening - Comments:: Readers-   Dietary issues and exercise activities discussed: Current Exercise Habits: Home exercise routine, Type of exercise: walking, Time (Minutes): 45, Frequency (Times/Week): 4, Weekly Exercise (Minutes/Week): 180, Intensity: Mild   Goals Addressed             This Visit's Progress    DIET - EAT MORE FRUITS AND VEGETABLES         Depression Screen    07/22/2022    3:36 PM 11/18/2021    3:42 PM 10/18/2021    3:43 PM 07/11/2021    3:49 PM 06/06/2021    2:41 PM 11/29/2020    2:15 PM 08/23/2020    3:37 PM  PHQ 2/9 Scores  PHQ - 2 Score 0 0 0 0 0 0 0  PHQ- 9 Score 0          Fall Risk    07/22/2022    3:38 PM 11/18/2021    3:42 PM 10/18/2021    3:43 PM 07/11/2021    3:48 PM 06/06/2021    2:41 PM  Fall Risk   Falls in the past year? 0 0 0 0 0   Number falls in past yr: 0      Injury with Fall? 0      Risk for fall due to : No Fall Risks No Fall Risks No Fall Risks No Fall Risks No Fall Risks  Follow up Falls prevention discussed;Falls evaluation completed Falls evaluation completed Falls evaluation completed Falls evaluation completed Falls evaluation completed    FALL RISK PREVENTION PERTAINING TO THE HOME:  Any stairs in or around the home? Yes  If so, are there any without handrails? No  Home free of loose throw rugs in walkways, pet beds, electrical cords, etc? Yes  Adequate lighting in your home to reduce risk of falls? Yes   ASSISTIVE DEVICES UTILIZED TO PREVENT FALLS:  Life alert? No  Use of a cane, walker or w/c? No  Grab bars in the bathroom? No  Shower chair or bench in shower? Yes  Elevated toilet seat or a handicapped toilet? Yes   Cognitive Function:        07/22/2022    3:49 PM  6CIT Screen  What Year? 0 points  What month? 0 points  What time? 0 points  Count back from 20 0 points  Months in reverse 0 points  Repeat phrase 0 points  Total Score 0 points    Immunizations Immunization History  Administered Date(s) Administered   Fluad Quad(high Dose 65+) 10/18/2021   Influenza,inj,Quad PF,6+ Mos 11/10/2012, 12/22/2013, 11/13/2014, 12/31/2015, 01/29/2017, 12/22/2018, 11/29/2020   Influenza-Unspecified 11/02/2017, 12/10/2019   PFIZER(Purple Top)SARS-COV-2 Vaccination 04/05/2019, 04/26/2019, 11/28/2019   PNEUMOCOCCAL CONJUGATE-20 03/13/2022    TDAP status: Due, Education has been provided regarding the importance of this vaccine. Advised may receive this vaccine at local pharmacy or Health Dept. Aware to provide a copy of the vaccination record if obtained from local  pharmacy or Health Dept. Verbalized acceptance and understanding.  Flu Vaccine status: Up to date  Pneumococcal vaccine status: Up to date  Covid-19 vaccine status: Completed vaccines  Qualifies for Shingles Vaccine? Yes    Zostavax completed No   Shingrix Completed?: No.    Education has been provided regarding the importance of this vaccine. Patient has been advised to call insurance company to determine out of pocket expense if they have not yet received this vaccine. Advised may also receive vaccine at local pharmacy or Health Dept. Verbalized acceptance and understanding.  Screening Tests Health Maintenance  Topic Date Due   Hepatitis C Screening  Never done   DTaP/Tdap/Td (1 - Tdap) Never done   Colonoscopy  Never done   Zoster Vaccines- Shingrix (1 of 2) Never done   DEXA SCAN  Never done   COVID-19 Vaccine (4 - 2023-24 season) 10/11/2021   Diabetic kidney evaluation - Urine ACR  10/24/2021   FOOT EXAM  07/12/2022   MAMMOGRAM  08/28/2022   HEMOGLOBIN A1C  09/09/2022   INFLUENZA VACCINE  09/11/2022   Diabetic kidney evaluation - eGFR measurement  03/12/2023   OPHTHALMOLOGY EXAM  06/27/2023   Medicare Annual Wellness (AWV)  07/22/2023   Pneumonia Vaccine 2+ Years old  Completed   HPV VACCINES  Aged Out    Health Maintenance  Health Maintenance Due  Topic Date Due   Hepatitis C Screening  Never done   DTaP/Tdap/Td (1 - Tdap) Never done   Colonoscopy  Never done   Zoster Vaccines- Shingrix (1 of 2) Never done   DEXA SCAN  Never done   COVID-19 Vaccine (4 - 2023-24 season) 10/11/2021   Diabetic kidney evaluation - Urine ACR  10/24/2021   FOOT EXAM  07/12/2022    Colorectal cancer screening: Referral to GI placed 07/22/22. Pt aware the office will call re: appt.  Mammogram status: Completed 08/27/21. Repeat every year  Bone Density status: Ordered 07/22/22. Pt provided with contact info and advised to call to schedule appt.  Lung Cancer Screening: (Low Dose CT Chest recommended if Age 46-80 years, 30 pack-year currently smoking OR have quit w/in 15years.) does not qualify.    Additional Screening:  Hepatitis C Screening: does qualify; Completed no  Vision Screening: Recommended annual  ophthalmology exams for early detection of glaucoma and other disorders of the eye. Is the patient up to date with their annual eye exam?  No  Who is the provider or what is the name of the office in which the patient attends annual eye exams? No one If pt is not established with a provider, would they like to be referred to a provider to establish care? No .   Dental Screening: Recommended annual dental exams for proper oral hygiene  Community Resource Referral / Chronic Care Management: CRR required this visit?  No   CCM required this visit?  No      Plan:     I have personally reviewed and noted the following in the patient's chart:   Medical and social history Use of alcohol, tobacco or illicit drugs  Current medications and supplements including opioid prescriptions. Patient is not currently taking opioid prescriptions. Functional ability and status Nutritional status Physical activity Advanced directives List of other physicians Hospitalizations, surgeries, and ER visits in previous 12 months Vitals Screenings to include cognitive, depression, and falls Referrals and appointments  In addition, I have reviewed and discussed with patient certain preventive protocols, quality metrics, and best practice recommendations. A  written personalized care plan for preventive services as well as general preventive health recommendations were provided to patient.     Hal Hope, LPN   09/20/9145   Nurse Notes: none

## 2022-07-22 NOTE — Patient Instructions (Signed)
Amber Decker , Thank you for taking time to come for your Medicare Wellness Visit. I appreciate your ongoing commitment to your health goals. Please review the following plan we discussed and let me know if I can assist you in the future.   These are the goals we discussed:  Goals      DIET - EAT MORE FRUITS AND VEGETABLES        This is a list of the screening recommended for you and due dates:  Health Maintenance  Topic Date Due   Hepatitis C Screening  Never done   DTaP/Tdap/Td vaccine (1 - Tdap) Never done   Colon Cancer Screening  Never done   Zoster (Shingles) Vaccine (1 of 2) Never done   DEXA scan (bone density measurement)  Never done   COVID-19 Vaccine (4 - 2023-24 season) 10/11/2021   Yearly kidney health urinalysis for diabetes  10/24/2021   Complete foot exam   07/12/2022   Mammogram  08/28/2022   Hemoglobin A1C  09/09/2022   Flu Shot  09/11/2022   Yearly kidney function blood test for diabetes  03/12/2023   Eye exam for diabetics  06/27/2023   Medicare Annual Wellness Visit  07/22/2023   Pneumonia Vaccine  Completed   HPV Vaccine  Aged Out    Advanced directives: no  Conditions/risks identified: none  Next appointment: Follow up in one year for your annual wellness visit 07/27/23 @ 3:00 pm by phone   Preventive Care 65 Years and Older, Female Preventive care refers to lifestyle choices and visits with your health care provider that can promote health and wellness. What does preventive care include? A yearly physical exam. This is also called an annual well check. Dental exams once or twice a year. Routine eye exams. Ask your health care provider how often you should have your eyes checked. Personal lifestyle choices, including: Daily care of your teeth and gums. Regular physical activity. Eating a healthy diet. Avoiding tobacco and drug use. Limiting alcohol use. Practicing safe sex. Taking low-dose aspirin every day. Taking vitamin and mineral  supplements as recommended by your health care provider. What happens during an annual well check? The services and screenings done by your health care provider during your annual well check will depend on your age, overall health, lifestyle risk factors, and family history of disease. Counseling  Your health care provider may ask you questions about your: Alcohol use. Tobacco use. Drug use. Emotional well-being. Home and relationship well-being. Sexual activity. Eating habits. History of falls. Memory and ability to understand (cognition). Work and work Astronomer. Reproductive health. Screening  You may have the following tests or measurements: Height, weight, and BMI. Blood pressure. Lipid and cholesterol levels. These may be checked every 5 years, or more frequently if you are over 54 years old. Skin check. Lung cancer screening. You may have this screening every year starting at age 9 if you have a 30-pack-year history of smoking and currently smoke or have quit within the past 15 years. Fecal occult blood test (FOBT) of the stool. You may have this test every year starting at age 36. Flexible sigmoidoscopy or colonoscopy. You may have a sigmoidoscopy every 5 years or a colonoscopy every 10 years starting at age 35. Hepatitis C blood test. Hepatitis B blood test. Sexually transmitted disease (STD) testing. Diabetes screening. This is done by checking your blood sugar (glucose) after you have not eaten for a while (fasting). You may have this done every 1-3 years. Bone  density scan. This is done to screen for osteoporosis. You may have this done starting at age 63. Mammogram. This may be done every 1-2 years. Talk to your health care provider about how often you should have regular mammograms. Talk with your health care provider about your test results, treatment options, and if necessary, the need for more tests. Vaccines  Your health care provider may recommend certain  vaccines, such as: Influenza vaccine. This is recommended every year. Tetanus, diphtheria, and acellular pertussis (Tdap, Td) vaccine. You may need a Td booster every 10 years. Zoster vaccine. You may need this after age 5. Pneumococcal 13-valent conjugate (PCV13) vaccine. One dose is recommended after age 33. Pneumococcal polysaccharide (PPSV23) vaccine. One dose is recommended after age 60. Talk to your health care provider about which screenings and vaccines you need and how often you need them. This information is not intended to replace advice given to you by your health care provider. Make sure you discuss any questions you have with your health care provider. Document Released: 02/23/2015 Document Revised: 10/17/2015 Document Reviewed: 11/28/2014 Elsevier Interactive Patient Education  2017 [place] Prevention in the Home Falls can cause injuries. They can happen to people of all ages. There are many things you can do to make your home safe and to help prevent falls. What can I do on the outside of my home? Regularly fix the edges of walkways and driveways and fix any cracks. Remove anything that might make you trip as you walk through a door, such as a raised step or threshold. Trim any bushes or trees on the path to your home. Use bright outdoor lighting. Clear any walking paths of anything that might make someone trip, such as rocks or tools. Regularly check to see if handrails are loose or broken. Make sure that both sides of any steps have handrails. Any raised decks and porches should have guardrails on the edges. Have any leaves, snow, or ice cleared regularly. Use sand or salt on walking paths during winter. Clean up any spills in your garage right away. This includes oil or grease spills. What can I do in the bathroom? Use night lights. Install grab bars by the toilet and in the tub and shower. Do not use towel bars as grab bars. Use non-skid mats or decals in  the tub or shower. If you need to sit down in the shower, use a plastic, non-slip stool. Keep the floor dry. Clean up any water that spills on the floor as soon as it happens. Remove soap buildup in the tub or shower regularly. Attach bath mats securely with double-sided non-slip rug tape. Do not have throw rugs and other things on the floor that can make you trip. What can I do in the bedroom? Use night lights. Make sure that you have a light by your bed that is easy to reach. Do not use any sheets or blankets that are too big for your bed. They should not hang down onto the floor. Have a firm chair that has side arms. You can use this for support while you get dressed. Do not have throw rugs and other things on the floor that can make you trip. What can I do in the kitchen? Clean up any spills right away. Avoid walking on wet floors. Keep items that you use a lot in easy-to-reach places. If you need to reach something above you, use a strong step stool that has a grab bar. Keep  electrical cords out of the way. Do not use floor polish or wax that makes floors slippery. If you must use wax, use non-skid floor wax. Do not have throw rugs and other things on the floor that can make you trip. What can I do with my stairs? Do not leave any items on the stairs. Make sure that there are handrails on both sides of the stairs and use them. Fix handrails that are broken or loose. Make sure that handrails are as long as the stairways. Check any carpeting to make sure that it is firmly attached to the stairs. Fix any carpet that is loose or worn. Avoid having throw rugs at the top or bottom of the stairs. If you do have throw rugs, attach them to the floor with carpet tape. Make sure that you have a light switch at the top of the stairs and the bottom of the stairs. If you do not have them, ask someone to add them for you. What else can I do to help prevent falls? Wear shoes that: Do not have high  heels. Have rubber bottoms. Are comfortable and fit you well. Are closed at the toe. Do not wear sandals. If you use a stepladder: Make sure that it is fully opened. Do not climb a closed stepladder. Make sure that both sides of the stepladder are locked into place. Ask someone to hold it for you, if possible. Clearly mark and make sure that you can see: Any grab bars or handrails. First and last steps. Where the edge of each step is. Use tools that help you move around (mobility aids) if they are needed. These include: Canes. Walkers. Scooters. Crutches. Turn on the lights when you go into a dark area. Replace any light bulbs as soon as they burn out. Set up your furniture so you have a clear path. Avoid moving your furniture around. If any of your floors are uneven, fix them. If there are any pets around you, be aware of where they are. Review your medicines with your doctor. Some medicines can make you feel dizzy. This can increase your chance of falling. Ask your doctor what other things that you can do to help prevent falls. This information is not intended to replace advice given to you by your health care provider. Make sure you discuss any questions you have with your health care provider. Document Released: 11/23/2008 Document Revised: 07/05/2015 Document Reviewed: 03/03/2014 Elsevier Interactive Patient Education  2017 [employer].

## 2022-07-23 ENCOUNTER — Other Ambulatory Visit (INDEPENDENT_AMBULATORY_CARE_PROVIDER_SITE_OTHER): Payer: PPO

## 2022-07-23 DIAGNOSIS — I10 Essential (primary) hypertension: Secondary | ICD-10-CM | POA: Diagnosis not present

## 2022-07-23 DIAGNOSIS — E1165 Type 2 diabetes mellitus with hyperglycemia: Secondary | ICD-10-CM | POA: Diagnosis not present

## 2022-07-23 DIAGNOSIS — E7800 Pure hypercholesterolemia, unspecified: Secondary | ICD-10-CM | POA: Diagnosis not present

## 2022-07-23 LAB — LIPID PANEL
Cholesterol: 134 mg/dL (ref 0–200)
HDL: 70.6 mg/dL (ref >39.00)
LDL Cholesterol: 46 mg/dL (ref 0–99)
NonHDL: 63.12
Total CHOL/HDL Ratio: 2
Triglycerides: 88 mg/dL (ref 0.0–149.0)
VLDL: 17.6 mg/dL (ref 0.0–40.0)

## 2022-07-23 LAB — BASIC METABOLIC PANEL
BUN: 22 mg/dL (ref 6–23)
CO2: 27 mEq/L (ref 19–32)
Calcium: 9.6 mg/dL (ref 8.4–10.5)
Chloride: 104 mEq/L (ref 96–112)
Creatinine, Ser: 1.05 mg/dL (ref 0.40–1.20)
GFR: 55.56 mL/min — ABNORMAL LOW (ref >60.00)
Glucose, Bld: 134 mg/dL — ABNORMAL HIGH (ref 70–99)
Potassium: 4.2 mEq/L (ref 3.5–5.1)
Sodium: 138 mEq/L (ref 135–145)

## 2022-07-23 LAB — HEPATIC FUNCTION PANEL
ALT: 14 U/L (ref 0–35)
AST: 19 U/L (ref 0–37)
Albumin: 4.3 g/dL (ref 3.5–5.2)
Alkaline Phosphatase: 42 U/L (ref 39–117)
Bilirubin, Direct: 0.1 mg/dL (ref 0.0–0.3)
Total Bilirubin: 0.5 mg/dL (ref 0.2–1.2)
Total Protein: 7.5 g/dL (ref 6.0–8.3)

## 2022-07-23 LAB — HEMOGLOBIN A1C: Hgb A1c MFr Bld: 6.7 % — ABNORMAL HIGH (ref 4.6–6.5)

## 2022-07-24 ENCOUNTER — Ambulatory Visit (INDEPENDENT_AMBULATORY_CARE_PROVIDER_SITE_OTHER): Payer: PPO | Admitting: Internal Medicine

## 2022-07-24 VITALS — BP 112/70 | HR 75 | Temp 97.9°F | Resp 16 | Ht 69.0 in | Wt 260.0 lb

## 2022-07-24 DIAGNOSIS — Z1231 Encounter for screening mammogram for malignant neoplasm of breast: Secondary | ICD-10-CM

## 2022-07-24 DIAGNOSIS — E1165 Type 2 diabetes mellitus with hyperglycemia: Secondary | ICD-10-CM | POA: Diagnosis not present

## 2022-07-24 DIAGNOSIS — G7000 Myasthenia gravis without (acute) exacerbation: Secondary | ICD-10-CM | POA: Diagnosis not present

## 2022-07-24 DIAGNOSIS — I4949 Other premature depolarization: Secondary | ICD-10-CM | POA: Diagnosis not present

## 2022-07-24 DIAGNOSIS — E7800 Pure hypercholesterolemia, unspecified: Secondary | ICD-10-CM | POA: Diagnosis not present

## 2022-07-24 DIAGNOSIS — Z0000 Encounter for general adult medical examination without abnormal findings: Secondary | ICD-10-CM

## 2022-07-24 DIAGNOSIS — I471 Supraventricular tachycardia, unspecified: Secondary | ICD-10-CM | POA: Diagnosis not present

## 2022-07-24 DIAGNOSIS — R944 Abnormal results of kidney function studies: Secondary | ICD-10-CM

## 2022-07-24 DIAGNOSIS — I10 Essential (primary) hypertension: Secondary | ICD-10-CM | POA: Diagnosis not present

## 2022-07-24 LAB — HM DIABETES FOOT EXAM

## 2022-07-24 NOTE — Progress Notes (Signed)
Subjective:    Patient ID: Amber Decker, female    DOB: [birth year], 67 y.o.   MRN: [id number]  Patient here for  Chief Complaint  Patient presents with   Annual Exam    HPI Here for physical exam. Reports she is doing relatively well.  Tries to stay active.  No chest pain or sob reported.  No cough or congestion reported.  No abdominal pain or bowel change reported.  Handling stress.  Tolerating mounjaro. Sugars improved.  A1c 6.7.  Handling stress.     Past Medical History:  Diagnosis Date   Diabetes mellitus without complication (HCC) 2008   Herpes    Hypertension    Myasthenia gravis [facility])    onset age 20's?   SVT (supraventricular tachycardia)    Past Surgical History:  Procedure Laterality Date   MYOMECTOMY     UTERINE FIBROID SURGERY     at [facility]   Family History  Problem Relation Age of Onset   Diabetes Mother    Alcohol abuse Father    Heart disease Father        myocardial infarction   Breast cancer Neg Hx    Colon cancer Neg Hx    Social History   Socioeconomic History   Marital status: Divorced    Spouse name: Not on file   Number of children: 0   Years of education: Not on file   Highest education level: Not on file  Occupational History    Employer: OTHER  Tobacco Use   Smoking status: Never   Smokeless tobacco: Never  Substance and Sexual Activity   Alcohol use: Yes    Alcohol/week: 0.0 standard drinks of alcohol    Comment: occasionally   Drug use: No   Sexual activity: Not on file  Other Topics Concern   Not on file  Social History Narrative   Not on file   Social Determinants of Health   Financial Resource Strain: Low Risk  (07/22/2022)   Overall Financial Resource Strain (CARDIA)    Difficulty of Paying Living Expenses: Not hard at all  Food Insecurity: No Food Insecurity (07/22/2022)   Hunger Vital Sign    Worried About Running Out of Food in the Last Year: Never true    Ran Out of Food in the Last Year: Never true   Transportation Needs: No Transportation Needs (07/22/2022)   PRAPARE - Administrator, Civil Service (Medical): No    Lack of Transportation (Non-Medical): No  Physical Activity: Sufficiently Active (07/22/2022)   Exercise Vital Sign    Days of Exercise per Week: 4 days    Minutes of Exercise per Session: 50 min  Stress: No Stress Concern Present (07/22/2022)   [employer] of Occupational Health - Occupational Stress Questionnaire    Feeling of Stress : Not at all  Social Connections: Moderately Isolated (07/22/2022)   Social Connection and Isolation Panel [NHANES]    Frequency of Communication with Friends and Family: More than three times a week    Frequency of Social Gatherings with Friends and Family: More than three times a week    Attends Religious Services: More than 4 times per year    Active Member of [employer] or Organizations: No    Attends Banker Meetings: Never    Marital Status: Divorced     Review of Systems  Constitutional:  Negative for appetite change and unexpected weight change.  HENT:  Negative for congestion, sinus pressure and sore  throat.   Eyes:  Negative for pain and visual disturbance.  Respiratory:  Negative for cough, chest tightness and shortness of breath.   Cardiovascular:  Negative for chest pain, palpitations and leg swelling.  Gastrointestinal:  Negative for abdominal pain, diarrhea, nausea and vomiting.  Genitourinary:  Negative for difficulty urinating and dysuria.  Musculoskeletal:  Negative for joint swelling and myalgias.  Skin:  Negative for color change and rash.  Neurological:  Negative for dizziness and headaches.  Hematological:  Negative for adenopathy. Does not bruise/bleed easily.  Psychiatric/Behavioral:  Negative for agitation and dysphoric mood.        Objective:     BP 112/70   Pulse 75   Temp 97.9 F (36.6 C)   Resp 16   Ht 5\' 9"  (1.753 m)   Wt 260 lb (117.9 kg)   LMP 02/21/2012   SpO2 98%    BMI 38.40 kg/m  Wt Readings from Last 3 Encounters:  07/24/22 260 lb (117.9 kg)  07/22/22 256 lb (116.1 kg)  03/13/22 256 lb 9.6 oz (116.4 kg)    Physical Exam Vitals reviewed.  Constitutional:      General: She is not in acute distress.    Appearance: Normal appearance. She is well-developed.  HENT:     Head: Normocephalic and atraumatic.     Right Ear: External ear normal.     Left Ear: External ear normal.  Eyes:     General: No scleral icterus.       Right eye: No discharge.        Left eye: No discharge.     Conjunctiva/sclera: Conjunctivae normal.  Neck:     Thyroid: No thyromegaly.  Cardiovascular:     Rate and Rhythm: Normal rate and regular rhythm.     Comments: Occasional premature beats.  Pulmonary:     Effort: No tachypnea, accessory muscle usage or respiratory distress.     Breath sounds: Normal breath sounds. No decreased breath sounds or wheezing.  Chest:  Breasts:    Right: No inverted nipple, mass, nipple discharge or tenderness (no axillary adenopathy).     Left: No inverted nipple, mass, nipple discharge or tenderness (no axilarry adenopathy).  Abdominal:     General: Bowel sounds are normal.     Palpations: Abdomen is soft.     Tenderness: There is no abdominal tenderness.  Musculoskeletal:        General: No swelling or tenderness.     Cervical back: Neck supple.  Lymphadenopathy:     Cervical: No cervical adenopathy.  Skin:    Findings: No erythema or rash.  Neurological:     Mental Status: She is alert and oriented to person, place, and time.  Psychiatric:        Mood and Affect: Mood normal.        Behavior: Behavior normal.      Outpatient Encounter Medications as of 07/24/2022  Medication Sig   acyclovir (ZOVIRAX) 400 MG tablet Take 1 tablet by mouth once daily   aspirin EC 81 MG tablet Take 81 mg by mouth daily.   Continuous Blood Gluc Sensor (FREESTYLE LIBRE 3 SENSOR) MISC Place 1 sensor on the skin every 14 days. Use to check  glucose continuously   GNP MELATONIN PO Take 1 each by mouth daily as needed.   lisinopril-hydrochlorothiazide (ZESTORETIC) 10-12.5 MG tablet Take 1 tablet by mouth once daily   Magnesium 250 MG TABS Take 1 tablet by mouth daily.   metFORMIN (GLUCOPHAGE-XR) 500  MG 24 hr tablet Take a total of 3 tablets (1500 mg) daily   metoprolol tartrate (LOPRESSOR) 25 MG tablet Take 1 tablet by mouth twice daily   rosuvastatin (CRESTOR) 5 MG tablet Take 1 tablet by mouth once daily   TURMERIC PO Take 1 each by mouth daily.   [DISCONTINUED] tirzepatide [facility]) 5 MG/0.5ML Pen Inject 5 mg into the skin once a week.   No facility-administered encounter medications on file as of 07/24/2022.     Lab Results  Component Value Date   WBC 3.5 (L) 03/11/2022   HGB 13.7 03/11/2022   HCT 40.2 03/11/2022   PLT 179.0 03/11/2022   GLUCOSE 134 (H) 07/23/2022   CHOL 134 07/23/2022   TRIG 88.0 07/23/2022   HDL 70.60 07/23/2022   LDLCALC 46 07/23/2022   ALT 14 07/23/2022   AST 19 07/23/2022   NA 138 07/23/2022   K 4.2 07/23/2022   CL 104 07/23/2022   CREATININE 1.05 07/23/2022   BUN 22 07/23/2022   CO2 27 07/23/2022   TSH 2.04 07/09/2021   HGBA1C 6.7 (H) 07/23/2022   MICROALBUR <0.7 10/24/2020    MM DIAG BREAST TOMO BILATERAL  Result Date: 08/27/2021 CLINICAL DATA:  BI-RADS 3 follow-up of RIGHT breast calcifications and a LEFT breast asymmetry. Callback was performed 07/07/2019 with diagnostic workup completed April 10, 2020. EXAM: DIGITAL DIAGNOSTIC BILATERAL MAMMOGRAM WITH TOMOSYNTHESIS AND CAD TECHNIQUE: Bilateral digital diagnostic mammography and breast tomosynthesis was performed. The images were evaluated with computer-aided detection. COMPARISON:  Previous exam(s). ACR Breast Density Category b: There are scattered areas of fibroglandular density. FINDINGS: Spot magnification views of the RIGHT breast demonstrate progressive coarsening of 2 adjacent groups of similar appearing calcifications in the  RIGHT upper outer breast at middle to posterior depth. These are morphologically similar in appearance to several scattered dystrophic calcifications noted in the LEFT breast. The larger group measures 3 mm while the smaller group measures 1-2 mm. These are favored to reflect early dystrophic calcifications. No new suspicious findings are noted in the RIGHT breast. Diagnostic images of the LEFT breast demonstrate stable mammographic appearance of an asymmetry in the LEFT outer breast at posterior depth. It is best seen on spot CC slice 55 and is favored to be similar in appearance dating back to 2018. IMPRESSION: 1. Progressive coarsening of probably benign RIGHT breast calcifications which are favored to reflect early dystrophic calcifications. Recommend follow-up diagnostic mammogram in 1 year. This will establish over 2 years of definitive stability with spot magnification technique. 2. Stable probably benign LEFT breast asymmetry. Recommend follow-up diagnostic mammogram in 1 year. This will establish 2 years of definitive stability. RECOMMENDATION: Bilateral diagnostic mammogram (with RIGHT and LEFT breast ultrasound as deemed necessary) in 1 year. I have discussed the findings and recommendations with the patient. If applicable, a reminder letter will be sent to the patient regarding the next appointment. BI-RADS CATEGORY  3: Probably benign. Electronically Signed   By: Meda Klinefelter M.D.   On: 08/27/2021 16:37      Assessment & Plan:  Routine general medical examination at a health care facility  Hypercholesteremia Assessment & Plan: Continue crestor.  Low cholesterol diet and exercise.  Follow lipid panel and liver function tests.    Orders: -     Lipid panel; Future -     Hepatic function panel; Future -     Basic metabolic panel; Future  Type 2 diabetes mellitus with hyperglycemia, without long-term current use of insulin (HCC) Assessment & Plan:  On mounjaro and metformin.  Has  adjusted her diet.  Is exercising.  Sugars much improved.  A1c 6.7.  Low carb diet and exercise.  Follow met b and a1c.   Orders: -     Hemoglobin A1c; Future -     Microalbumin / creatinine urine ratio; Future  Visit for screening mammogram  Premature beats Assessment & Plan: Noted on exam - premature beats.  EKG - SR - PAC.  Currently without symptoms.  Continue metoprolol.  Follow.   Orders: -     EKG 12-Lead  Decreased GFR -     Basic metabolic panel; Future  Essential hypertension, benign Assessment & Plan: Continue lisinopril/hctz and metoprolol.  Blood pressure has been doing well.  Follow pressures.  Follow metabolic panel.    Health care maintenance Assessment & Plan: Physical today 07/24/22.  Pap smear 08/24/2019.  Bilateral diagnostic mammogram 08/27/21 [facility] - Bilateral diagnostic mammogram (with RIGHT and LEFT breast ultrasound as deemed necessary) in 1 year    Had referred to GI for colonoscopy.  We can follow-up and confirm she makes appointment.  PAP - 08/23/20 - negative with negative HPV.    Myasthenia gravis (HCC) Assessment & Plan: Stable.  No symptoms.  No further intervention needed at this time.    SVT (supraventricular tachycardia) Assessment & Plan: Continue metoprolol.  Stable.       [place], MD

## 2022-07-25 ENCOUNTER — Other Ambulatory Visit: Payer: Self-pay | Admitting: Internal Medicine

## 2022-07-25 DIAGNOSIS — E1165 Type 2 diabetes mellitus with hyperglycemia: Secondary | ICD-10-CM

## 2022-07-30 ENCOUNTER — Encounter: Payer: Self-pay | Admitting: Internal Medicine

## 2022-07-30 NOTE — Assessment & Plan Note (Addendum)
Physical today 07/24/22.  Pap smear 08/24/2019.  Bilateral diagnostic mammogram 08/27/21 [facility] III - Bilateral diagnostic mammogram (with RIGHT and LEFT breast ultrasound as deemed necessary) in 1 year    Had referred to GI for colonoscopy.  We can follow-up and confirm she makes appointment.  PAP - 08/23/20 - negative with negative HPV.

## 2022-07-30 NOTE — Assessment & Plan Note (Signed)
On mounjaro and metformin.  Has adjusted her diet.  Is exercising.  Sugars much improved.  A1c 6.7.  Low carb diet and exercise.  Follow met b and a1c.

## 2022-07-30 NOTE — Assessment & Plan Note (Signed)
Continue lisinopril/hctz and metoprolol.  Blood pressure has been doing well.  Follow pressures.  Follow metabolic panel.  

## 2022-07-30 NOTE — Assessment & Plan Note (Signed)
Noted on exam - premature beats.  EKG - SR - PAC.  Currently without symptoms.  Continue metoprolol.  Follow.

## 2022-07-30 NOTE — Assessment & Plan Note (Signed)
Continue metoprolol.  Stable.  

## 2022-07-30 NOTE — Assessment & Plan Note (Signed)
Stable.  No symptoms.  No further intervention needed at this time.  

## 2022-07-30 NOTE — Assessment & Plan Note (Signed)
Continue crestor.  Low cholesterol diet and exercise.  Follow lipid panel and liver function tests.  

## 2022-07-31 ENCOUNTER — Encounter: Payer: Self-pay | Admitting: *Deleted

## 2022-08-11 ENCOUNTER — Other Ambulatory Visit: Payer: Self-pay | Admitting: Internal Medicine

## 2022-08-11 DIAGNOSIS — N6489 Other specified disorders of breast: Secondary | ICD-10-CM

## 2022-08-11 DIAGNOSIS — R921 Mammographic calcification found on diagnostic imaging of breast: Secondary | ICD-10-CM

## 2022-08-13 ENCOUNTER — Telehealth: Payer: Self-pay | Admitting: Internal Medicine

## 2022-08-13 ENCOUNTER — Other Ambulatory Visit: Payer: PPO | Admitting: Pharmacist

## 2022-08-13 NOTE — Telephone Encounter (Signed)
Patients mounjaro is costing 600.00 . She use to pay 47.00. She has one injection left for this week . She wants to know what she can do to reduce the cost.

## 2022-08-13 NOTE — Telephone Encounter (Signed)
Noted,

## 2022-08-13 NOTE — Progress Notes (Signed)
Care Coordination Call  Received call from patient regarding cost of Mounjaro. Appears she is in the Coverage Gap. No patient assistance at this time for [facility], but we can pursue Ozempic assistance. She will double check her income and call me in the next few days.   We will go ahead and see if we can start [employer]; if not, will work with the technician team to mail patient her application.   Patient will take her last dose of Mounjaro this week and monitor blood sugars for need to add different therapy while waiting for Ozempic assistance to come in.   [name] Eppie Gibson, PharmD, BCACP, CPP Clinical Pharmacist [facility] [phone]

## 2022-08-13 NOTE — Telephone Encounter (Signed)
Talking to patient now. Will discuss solutions.

## 2022-08-15 ENCOUNTER — Telehealth: Payer: Self-pay

## 2022-08-15 NOTE — Telephone Encounter (Signed)
-----   Message from Alden Hipp, RPH-CPP sent at 08/13/2022  1:08 PM EDT ----- Can we attempt Novo Nordisk PAP for Ozempic 1 mg for this patient online? Will be switching from [facility]. Has 1 dose of Mounjaro left and no samples at the office, so I want to move as quick as possible.

## 2022-08-15 NOTE — Telephone Encounter (Signed)
Submitted application  E-FILE for OZEMPIC 1 mg to [employer] for patient assistance PROCESSING .   Phone: [phone]  PLEASE BE ADVISED   Melanee Spry CPhT [facility] [phone]

## 2022-08-19 ENCOUNTER — Other Ambulatory Visit (INDEPENDENT_AMBULATORY_CARE_PROVIDER_SITE_OTHER): Payer: PPO

## 2022-08-19 DIAGNOSIS — E1165 Type 2 diabetes mellitus with hyperglycemia: Secondary | ICD-10-CM

## 2022-08-19 DIAGNOSIS — E7800 Pure hypercholesterolemia, unspecified: Secondary | ICD-10-CM

## 2022-08-19 DIAGNOSIS — R944 Abnormal results of kidney function studies: Secondary | ICD-10-CM | POA: Diagnosis not present

## 2022-08-20 LAB — LIPID PANEL
Cholesterol: 145 mg/dL (ref 0–200)
HDL: 61.1 mg/dL (ref >39.00)
LDL Cholesterol: 61 mg/dL (ref 0–99)
NonHDL: 84.38
Total CHOL/HDL Ratio: 2
Triglycerides: 119 mg/dL (ref 0.0–149.0)
VLDL: 23.8 mg/dL (ref 0.0–40.0)

## 2022-08-20 LAB — BASIC METABOLIC PANEL
BUN: 21 mg/dL (ref 6–23)
CO2: 28 mEq/L (ref 19–32)
Calcium: 10.2 mg/dL (ref 8.4–10.5)
Chloride: 104 mEq/L (ref 96–112)
Creatinine, Ser: 1.12 mg/dL (ref 0.40–1.20)
GFR: 51.39 mL/min — ABNORMAL LOW (ref >60.00)
Glucose, Bld: 118 mg/dL — ABNORMAL HIGH (ref 70–99)
Potassium: 4.4 mEq/L (ref 3.5–5.1)
Sodium: 141 mEq/L (ref 135–145)

## 2022-08-20 LAB — HEPATIC FUNCTION PANEL
ALT: 12 U/L (ref 0–35)
AST: 18 U/L (ref 0–37)
Albumin: 4.2 g/dL (ref 3.5–5.2)
Alkaline Phosphatase: 47 U/L (ref 39–117)
Bilirubin, Direct: 0 mg/dL (ref 0.0–0.3)
Total Bilirubin: 0.3 mg/dL (ref 0.2–1.2)
Total Protein: 7.3 g/dL (ref 6.0–8.3)

## 2022-08-20 LAB — HEMOGLOBIN A1C: Hgb A1c MFr Bld: 7 % — ABNORMAL HIGH (ref 4.6–6.5)

## 2022-08-20 LAB — MICROALBUMIN / CREATININE URINE RATIO
Creatinine,U: 142.1 mg/dL
Microalb Creat Ratio: 0.5 mg/g (ref 0.0–30.0)
Microalb, Ur: <0.7 mg/dL (ref 0.0–1.9)

## 2022-08-25 ENCOUNTER — Other Ambulatory Visit: Payer: Self-pay

## 2022-08-25 DIAGNOSIS — R944 Abnormal results of kidney function studies: Secondary | ICD-10-CM

## 2022-09-01 NOTE — Telephone Encounter (Signed)
Amber Decker, any updates about her Ozempic?

## 2022-09-01 NOTE — Telephone Encounter (Signed)
Pt called in to check status of previous message. Pt stated that she haven't heard from [place] or [name] about other options for pt. Please advice.

## 2022-09-02 NOTE — Telephone Encounter (Signed)
Received notification from [employer] regarding approval for OZEMPIC 1mg . Patient assistance approved from 08/15/2022 to 08/15/2023.  Phone: [phone]    Please be advised pt can call the number above to check shipping status. Shipping is in progress now and will be shipped out in 1 to 2 days but it may take up to 10 to 14 days for delivery.

## 2022-09-04 ENCOUNTER — Telehealth: Payer: Self-pay

## 2022-09-04 MED ORDER — SEMAGLUTIDE (1 MG/DOSE) 4 MG/3ML ~~LOC~~ SOPN: 1.0000 mg | PEN_INJECTOR | SUBCUTANEOUS | Status: AC

## 2022-09-04 NOTE — Telephone Encounter (Signed)
4 boxes of ozempic received. Patient changing from ozempic to [facility] due to cost. Updated med list: D/c mounjaro, added Ozempic 1 mg because this was not on her list. Labeled meds. Pt aware and will pick up tomorrow.  FYI Dr Lorin Picket

## 2022-09-04 NOTE — Telephone Encounter (Signed)
She is changing from mounjaro to ozempic - 1mg .

## 2022-09-05 NOTE — Telephone Encounter (Signed)
Patient has picked up medication 

## 2022-09-05 NOTE — Telephone Encounter (Signed)
Noted. Patient is aware. 

## 2022-09-18 ENCOUNTER — Other Ambulatory Visit: Payer: Self-pay | Admitting: Internal Medicine

## 2022-09-24 ENCOUNTER — Other Ambulatory Visit (INDEPENDENT_AMBULATORY_CARE_PROVIDER_SITE_OTHER): Payer: PPO

## 2022-09-24 DIAGNOSIS — R944 Abnormal results of kidney function studies: Secondary | ICD-10-CM

## 2022-09-25 LAB — BASIC METABOLIC PANEL
BUN: 21 mg/dL (ref 6–23)
CO2: 26 meq/L (ref 19–32)
Calcium: 9.5 mg/dL (ref 8.4–10.5)
Chloride: 103 meq/L (ref 96–112)
Creatinine, Ser: 0.9 mg/dL (ref 0.40–1.20)
GFR: 66.77 mL/min (ref >60.00)
Glucose, Bld: 145 mg/dL — ABNORMAL HIGH (ref 70–99)
Potassium: 3.9 meq/L (ref 3.5–5.1)
Sodium: 137 meq/L (ref 135–145)

## 2022-10-16 ENCOUNTER — Ambulatory Visit
Admission: RE | Admit: 2022-10-16 | Discharge: 2022-10-16 | Disposition: A | Discharge status: Home / Self Care | Payer: PPO | Source: Ambulatory Visit | Attending: Internal Medicine | Admitting: Internal Medicine

## 2022-10-16 DIAGNOSIS — R921 Mammographic calcification found on diagnostic imaging of breast: Secondary | ICD-10-CM | POA: Insufficient documentation

## 2022-10-16 DIAGNOSIS — N6489 Other specified disorders of breast: Secondary | ICD-10-CM

## 2022-10-16 DIAGNOSIS — Z780 Asymptomatic menopausal state: Secondary | ICD-10-CM | POA: Diagnosis not present

## 2022-10-16 DIAGNOSIS — R92323 Mammographic fibroglandular density, bilateral breasts: Secondary | ICD-10-CM | POA: Diagnosis not present

## 2022-10-17 ENCOUNTER — Other Ambulatory Visit: Payer: Self-pay | Admitting: Internal Medicine

## 2022-10-17 DIAGNOSIS — R921 Mammographic calcification found on diagnostic imaging of breast: Secondary | ICD-10-CM

## 2022-10-17 DIAGNOSIS — R928 Other abnormal and inconclusive findings on diagnostic imaging of breast: Secondary | ICD-10-CM

## 2022-10-20 NOTE — Group Note (Deleted)

## 2022-10-21 ENCOUNTER — Other Ambulatory Visit: Payer: Self-pay | Admitting: Internal Medicine

## 2022-10-21 DIAGNOSIS — E7800 Pure hypercholesterolemia, unspecified: Secondary | ICD-10-CM

## 2022-10-24 ENCOUNTER — Ambulatory Visit
Admission: RE | Admit: 2022-10-24 | Discharge: 2022-10-24 | Disposition: A | Discharge status: Home / Self Care | Payer: PPO | Source: Ambulatory Visit | Attending: Internal Medicine | Admitting: Internal Medicine

## 2022-10-24 DIAGNOSIS — R928 Other abnormal and inconclusive findings on diagnostic imaging of breast: Secondary | ICD-10-CM | POA: Diagnosis present

## 2022-10-24 DIAGNOSIS — R921 Mammographic calcification found on diagnostic imaging of breast: Secondary | ICD-10-CM

## 2022-10-24 HISTORY — PX: BREAST BIOPSY: SHX20

## 2022-10-24 MED ORDER — LIDOCAINE-EPINEPHRINE (PF) 1 %-1:200000 IJ SOLN
20.0000 mL | Freq: Once | INTRAMUSCULAR | Status: AC
  Administered 2022-10-24: 20 mL
  Filled 2022-10-24: qty 20

## 2022-10-24 MED ORDER — LIDOCAINE 1 % OPTIME INJ - NO CHARGE
5.0000 mL | Freq: Once | INTRAMUSCULAR | Status: AC
  Administered 2022-10-24: 5 mL
  Filled 2022-10-24: qty 6

## 2022-10-27 ENCOUNTER — Other Ambulatory Visit: Payer: Self-pay | Admitting: Internal Medicine

## 2022-10-27 DIAGNOSIS — E1165 Type 2 diabetes mellitus with hyperglycemia: Secondary | ICD-10-CM

## 2022-10-27 LAB — SURGICAL PATHOLOGY

## 2022-11-09 ENCOUNTER — Other Ambulatory Visit: Payer: Self-pay | Admitting: Internal Medicine

## 2022-11-09 DIAGNOSIS — E1165 Type 2 diabetes mellitus with hyperglycemia: Secondary | ICD-10-CM

## 2022-11-20 ENCOUNTER — Other Ambulatory Visit: Payer: PPO

## 2022-11-24 ENCOUNTER — Other Ambulatory Visit: Payer: Self-pay | Admitting: Internal Medicine

## 2022-11-24 DIAGNOSIS — E1165 Type 2 diabetes mellitus with hyperglycemia: Secondary | ICD-10-CM

## 2022-11-25 ENCOUNTER — Ambulatory Visit: Payer: PPO | Admitting: Internal Medicine

## 2022-11-26 ENCOUNTER — Telehealth: Payer: Self-pay | Admitting: Internal Medicine

## 2022-11-26 DIAGNOSIS — E7800 Pure hypercholesterolemia, unspecified: Secondary | ICD-10-CM

## 2022-11-26 DIAGNOSIS — E1165 Type 2 diabetes mellitus with hyperglycemia: Secondary | ICD-10-CM

## 2022-11-26 NOTE — Telephone Encounter (Signed)
Order placed for labs.

## 2022-11-26 NOTE — Telephone Encounter (Signed)
Patient need lab orders.

## 2022-11-29 ENCOUNTER — Other Ambulatory Visit: Payer: Self-pay | Admitting: Internal Medicine

## 2022-11-29 DIAGNOSIS — I10 Essential (primary) hypertension: Secondary | ICD-10-CM

## 2022-12-02 ENCOUNTER — Other Ambulatory Visit: Payer: PPO

## 2022-12-04 ENCOUNTER — Ambulatory Visit (INDEPENDENT_AMBULATORY_CARE_PROVIDER_SITE_OTHER): Payer: PPO | Admitting: Internal Medicine

## 2022-12-04 ENCOUNTER — Encounter: Payer: Self-pay | Admitting: Internal Medicine

## 2022-12-04 VITALS — BP 120/72 | HR 80 | Temp 98.2°F | Resp 16 | Ht 69.0 in | Wt 261.0 lb

## 2022-12-04 DIAGNOSIS — G7000 Myasthenia gravis without (acute) exacerbation: Secondary | ICD-10-CM | POA: Diagnosis not present

## 2022-12-04 DIAGNOSIS — E7800 Pure hypercholesterolemia, unspecified: Secondary | ICD-10-CM

## 2022-12-04 DIAGNOSIS — I10 Essential (primary) hypertension: Secondary | ICD-10-CM

## 2022-12-04 DIAGNOSIS — Z7984 Long term (current) use of oral hypoglycemic drugs: Secondary | ICD-10-CM

## 2022-12-04 DIAGNOSIS — Z7985 Long-term (current) use of injectable non-insulin antidiabetic drugs: Secondary | ICD-10-CM | POA: Diagnosis not present

## 2022-12-04 DIAGNOSIS — I471 Supraventricular tachycardia, unspecified: Secondary | ICD-10-CM

## 2022-12-04 DIAGNOSIS — E1165 Type 2 diabetes mellitus with hyperglycemia: Secondary | ICD-10-CM

## 2022-12-04 NOTE — Assessment & Plan Note (Signed)
Continue crestor.  Low cholesterol diet and exercise.  Follow lipid panel and liver function tests.  

## 2022-12-04 NOTE — Assessment & Plan Note (Signed)
Stable.  No symptoms.  No further intervention needed at this time.  

## 2022-12-04 NOTE — Progress Notes (Signed)
Subjective:    Patient ID: Amber Decker, female    DOB: [birth year], 67 y.o.   MRN: [id number]  Patient here for  Chief Complaint  Patient presents with   Medical Management of Chronic Issues    HPI Here for a follow up appt - f/u regarding hypercholesterolemia, diabetes and hypertension.  She reports she is doing well.  Feels good.  Staying active. Just returned from a cruise.  No chest pain or sob reported.  No abdominal pain or bowel change reported.  States am sugars averaging 92-120.  Pm sugars <160-180.    Past Medical History:  Diagnosis Date   Diabetes mellitus without complication (HCC) 2008   Herpes    Hypertension    Myasthenia gravis [facility])    onset age 89's?   SVT (supraventricular tachycardia) [facility])    Past Surgical History:  Procedure Laterality Date   BREAST BIOPSY Right 10/24/2022   right breast stereo, X marker, path pending   BREAST BIOPSY Right 10/24/2022   MM RT BREAST BX W LOC DEV 1ST LESION IMAGE BX SPEC STEREO GUIDE 10/24/2022 Glennon Mac, MD [facility]   MYOMECTOMY     UTERINE FIBROID SURGERY     at [facility]   Family History  Problem Relation Age of Onset   Diabetes Mother    Alcohol abuse Father    Heart disease Father        myocardial infarction   Breast cancer Neg Hx    Colon cancer Neg Hx    Social History   Socioeconomic History   Marital status: Divorced    Spouse name: Not on file   Number of children: 0   Years of education: Not on file   Highest education level: Not on file  Occupational History    Employer: OTHER  Tobacco Use   Smoking status: Never   Smokeless tobacco: Never  Substance and Sexual Activity   Alcohol use: Yes    Alcohol/week: 0.0 standard drinks of alcohol    Comment: occasionally   Drug use: No   Sexual activity: Not on file  Other Topics Concern   Not on file  Social History Narrative   Not on file   Social Determinants of Health   Financial Resource Strain: Low Risk  (07/22/2022)    Overall Financial Resource Strain (CARDIA)    Difficulty of Paying Living Expenses: Not hard at all  Food Insecurity: No Food Insecurity (07/22/2022)   Hunger Vital Sign    Worried About Running Out of Food in the Last Year: Never true    Ran Out of Food in the Last Year: Never true  Transportation Needs: No Transportation Needs (07/22/2022)   PRAPARE - Administrator, Civil Service (Medical): No    Lack of Transportation (Non-Medical): No  Physical Activity: Sufficiently Active (07/22/2022)   Exercise Vital Sign    Days of Exercise per Week: 4 days    Minutes of Exercise per Session: 50 min  Stress: No Stress Concern Present (07/22/2022)   [employer] of Occupational Health - Occupational Stress Questionnaire    Feeling of Stress : Not at all  Social Connections: Moderately Isolated (07/22/2022)   Social Connection and Isolation Panel [NHANES]    Frequency of Communication with Friends and Family: More than three times a week    Frequency of Social Gatherings with Friends and Family: More than three times a week    Attends Religious Services: More than 4 times per year  Active Member of Clubs or Organizations: No    Attends Banker Meetings: Never    Marital Status: Divorced     Review of Systems  Constitutional:  Negative for appetite change and unexpected weight change.  HENT:  Negative for congestion and sinus pressure.   Respiratory:  Negative for cough, chest tightness and shortness of breath.   Cardiovascular:  Negative for chest pain, palpitations and leg swelling.  Gastrointestinal:  Negative for abdominal pain, diarrhea, nausea and vomiting.  Genitourinary:  Negative for difficulty urinating and dysuria.  Musculoskeletal:  Negative for joint swelling and myalgias.  Skin:  Negative for color change and rash.  Neurological:  Negative for dizziness and headaches.  Psychiatric/Behavioral:  Negative for agitation and dysphoric mood.         Objective:     BP 120/72   Pulse 80   Temp 98.2 F (36.8 C)   Resp 16   Ht 5\' 9"  (1.753 m)   Wt 261 lb (118.4 kg)   LMP 02/21/2012   SpO2 98%   BMI 38.54 kg/m  Wt Readings from Last 3 Encounters:  12/04/22 261 lb (118.4 kg)  07/24/22 260 lb (117.9 kg)  07/22/22 256 lb (116.1 kg)    Physical Exam Vitals reviewed.  Constitutional:      General: She is not in acute distress.    Appearance: Normal appearance.  HENT:     Head: Normocephalic and atraumatic.     Right Ear: External ear normal.     Left Ear: External ear normal.  Eyes:     General: No scleral icterus.       Right eye: No discharge.        Left eye: No discharge.     Conjunctiva/sclera: Conjunctivae normal.  Neck:     Thyroid: No thyromegaly.  Cardiovascular:     Rate and Rhythm: Normal rate and regular rhythm.  Pulmonary:     Effort: No respiratory distress.     Breath sounds: Normal breath sounds. No wheezing.  Abdominal:     General: Bowel sounds are normal.     Palpations: Abdomen is soft.     Tenderness: There is no abdominal tenderness.  Musculoskeletal:        General: No swelling or tenderness.     Cervical back: Neck supple. No tenderness.  Lymphadenopathy:     Cervical: No cervical adenopathy.  Skin:    Findings: No erythema or rash.  Neurological:     Mental Status: She is alert.  Psychiatric:        Mood and Affect: Mood normal.        Behavior: Behavior normal.      Outpatient Encounter Medications as of 12/04/2022  Medication Sig   acyclovir (ZOVIRAX) 400 MG tablet Take 1 tablet by mouth once daily   aspirin EC 81 MG tablet Take 81 mg by mouth daily.   Continuous Glucose Sensor (FREESTYLE LIBRE 3 SENSOR) MISC PLACE 1 SENSOR ON THE SKIN EVERY 14 DAYS. USE TO CHECK GLUCOSE CONTINUOUSLY   GNP MELATONIN PO Take 1 each by mouth daily as needed.   lisinopril-hydrochlorothiazide (ZESTORETIC) 10-12.5 MG tablet Take 1 tablet by mouth once daily   Magnesium 250 MG TABS Take 1 tablet by  mouth daily.   metFORMIN (GLUCOPHAGE-XR) 500 MG 24 hr tablet Take 3 tablets by mouth once daily   metoprolol tartrate (LOPRESSOR) 25 MG tablet Take 1 tablet by mouth twice daily   rosuvastatin (CRESTOR) 5 MG tablet Take  1 tablet by mouth once daily   Semaglutide, 1 MG/DOSE, 4 MG/3ML SOPN Inject 1 mg as directed once a week.   TURMERIC PO Take 1 each by mouth daily.   No facility-administered encounter medications on file as of 12/04/2022.     Lab Results  Component Value Date   WBC 3.5 (L) 03/11/2022   HGB 13.7 03/11/2022   HCT 40.2 03/11/2022   PLT 179.0 03/11/2022   GLUCOSE 145 (H) 09/24/2022   CHOL 145 08/19/2022   TRIG 119.0 08/19/2022   HDL 61.10 08/19/2022   LDLCALC 61 08/19/2022   ALT 12 08/19/2022   AST 18 08/19/2022   NA 137 09/24/2022   K 3.9 09/24/2022   CL 103 09/24/2022   CREATININE 0.90 09/24/2022   BUN 21 09/24/2022   CO2 26 09/24/2022   TSH 2.04 07/09/2021   HGBA1C 7.0 (H) 08/19/2022   MICROALBUR <0.7 08/19/2022    MM RT BREAST BX W LOC DEV 1ST LESION IMAGE BX SPEC STEREO GUIDE  Addendum Date: 10/27/2022   ADDENDUM REPORT: 10/27/2022 12:56 ADDENDUM: PATHOLOGY revealed: 1. Breast, right, needle core biopsy, upper outer posterior depth calcifications, x clip : - FIBROADENOMATOID CHANGE WITH ASSOCIATED COARSE CALCIFICATIONS.- FOCAL ADENOSIS - NEGATIVE FOR MALIGNANCY. Pathology results are CONCORDANT with imaging findings, per Dr. Meda Klinefelter. Pathology results and recommendations were discussed with patient via telephone on 10/27/2022. Patient reported biopsy site doing well with no adverse symptoms, and only slight tenderness at the site. Post biopsy care instructions were reviewed, questions were answered and my direct phone number was provided. Patient was instructed to call [facility] for any additional questions or concerns related to biopsy site. RECOMMENDATION: Patient instructed to resume annual bilateral screening mammogram due September  2025. Pathology results reported by Randa Lynn RN on 10/27/2022. Electronically Signed   By: Meda Klinefelter M.D.   On: 10/27/2022 12:56   Result Date: 10/27/2022 CLINICAL DATA:  Indeterminate RIGHT breast calcifications EXAM: RIGHT BREAST STEREOTACTIC CORE NEEDLE BIOPSY COMPARISON:  Previous exam(s). FINDINGS: The patient and I discussed the procedure of stereotactic-guided biopsy including benefits and alternatives. We discussed the high likelihood of a successful procedure. We discussed the risks of the procedure including infection, bleeding, tissue injury, clip migration, and inadequate sampling. Informed written consent was given. The usual time out protocol was performed immediately prior to the procedure. Using sterile technique and 1% lidocaine and 1% lidocaine with epinephrine as local anesthetic, under stereotactic guidance, a 9 gauge vacuum assisted device was used to perform core needle biopsy of calcifications in the upper outer quadrant of the RIGHT breast using a lateral approach. Specimen radiograph was performed showing representative calcifications. Specimens with calcifications are identified for pathology. Lesion quadrant: Upper outer quadrant At the conclusion of the procedure, an X shaped tissue marker clip was deployed into the biopsy cavity. Follow-up 2-view mammogram was performed and dictated separately. IMPRESSION: Stereotactic-guided biopsy of indeterminate calcifications. No apparent complications. Electronically Signed: By: Meda Klinefelter M.D. On: 10/24/2022 12:00   MM CLIP PLACEMENT RIGHT  Result Date: 10/24/2022 CLINICAL DATA:  Status post stereotactic guided biopsy EXAM: 3D DIAGNOSTIC RIGHT MAMMOGRAM POST STEREOTACTIC BIOPSY COMPARISON:  Previous exam(s). FINDINGS: 3D Mammographic images were obtained following stereotactic guided biopsy of indeterminate calcifications. The X shaped biopsy marking clip is in expected position at the site of biopsy. IMPRESSION:  Appropriate positioning of the X shaped biopsy marking clip at the site of biopsy in the upper outer breast. Final Assessment: Post Procedure Mammograms for Marker Placement  BI-RADS CATEGORY  50M: Post-Procedure Mammogram for Marker Placement Electronically Signed   By: Meda Klinefelter M.D.   On: 10/24/2022 11:59       Assessment & Plan:  Essential hypertension, benign Assessment & Plan: Continue lisinopril/hctz and metoprolol.  Blood pressure has been doing well.  Follow pressures.  Follow metabolic panel.    Hypercholesteremia Assessment & Plan: Continue crestor.  Low cholesterol diet and exercise.  Follow lipid panel and liver function tests.    Orders: -     TSH -     Lipid panel -     Hepatic function panel -     CBC with Differential/Platelet  Type 2 diabetes mellitus with hyperglycemia, without long-term current use of insulin (HCC) Assessment & Plan: On ozempic and metformin.  Has adjusted her diet.  Is staying active. Sugars much improved. Low carb diet and exercise.  Follow met b and a1c.  Check today.   Orders: -     Hemoglobin A1c -     Basic metabolic panel  Myasthenia gravis (HCC) Assessment & Plan: Stable.  No symptoms.  No further intervention needed at this time.    SVT (supraventricular tachycardia) (HCC) Assessment & Plan: Continue metoprolol.  Stable.       Dale Arcanum, MD

## 2022-12-04 NOTE — Assessment & Plan Note (Signed)
Continue lisinopril/hctz and metoprolol.  Blood pressure has been doing well.  Follow pressures.  Follow metabolic panel.  

## 2022-12-04 NOTE — Assessment & Plan Note (Signed)
On ozempic and metformin.  Has adjusted her diet.  Is staying active. Sugars much improved. Low carb diet and exercise.  Follow met b and a1c.  Check today.

## 2022-12-04 NOTE — Assessment & Plan Note (Signed)
Continue metoprolol.  Stable.

## 2022-12-05 ENCOUNTER — Telehealth: Payer: Self-pay

## 2022-12-05 LAB — CBC WITH DIFFERENTIAL/PLATELET
Basophils Absolute: 0 10*3/uL (ref 0.0–0.1)
Basophils Relative: 1.2 % (ref 0.0–3.0)
Eosinophils Absolute: 0.1 10*3/uL (ref 0.0–0.7)
Eosinophils Relative: 2.4 % (ref 0.0–5.0)
HCT: 38.8 % (ref 36.0–46.0)
Hemoglobin: 12.8 g/dL (ref 12.0–15.0)
Lymphocytes Relative: 52.3 % — ABNORMAL HIGH (ref 12.0–46.0)
Lymphs Abs: 1.7 10*3/uL (ref 0.7–4.0)
MCHC: 33.1 g/dL (ref 30.0–36.0)
MCV: 91.1 fL (ref 78.0–100.0)
Monocytes Absolute: 0.4 10*3/uL (ref 0.1–1.0)
Monocytes Relative: 11.4 % (ref 3.0–12.0)
Neutro Abs: 1.1 10*3/uL — ABNORMAL LOW (ref 1.4–7.7)
Neutrophils Relative %: 32.7 % — ABNORMAL LOW (ref 43.0–77.0)
Platelets: 205 10*3/uL (ref 150.0–400.0)
RBC: 4.25 Mil/uL (ref 3.87–5.11)
RDW: 13.6 % (ref 11.5–15.5)
WBC: 3.3 10*3/uL — ABNORMAL LOW (ref 4.0–10.5)

## 2022-12-05 LAB — HEPATIC FUNCTION PANEL
ALT: 12 U/L (ref 0–35)
AST: 18 U/L (ref 0–37)
Albumin: 4.3 g/dL (ref 3.5–5.2)
Alkaline Phosphatase: 44 U/L (ref 39–117)
Bilirubin, Direct: 0.1 mg/dL (ref 0.0–0.3)
Total Bilirubin: 0.4 mg/dL (ref 0.2–1.2)
Total Protein: 7.3 g/dL (ref 6.0–8.3)

## 2022-12-05 LAB — LIPID PANEL
Cholesterol: 139 mg/dL (ref 0–200)
HDL: 67.5 mg/dL (ref >39.00)
LDL Cholesterol: 59 mg/dL (ref 0–99)
NonHDL: 71.55
Total CHOL/HDL Ratio: 2
Triglycerides: 64 mg/dL (ref 0.0–149.0)
VLDL: 12.8 mg/dL (ref 0.0–40.0)

## 2022-12-05 LAB — BASIC METABOLIC PANEL
BUN: 16 mg/dL (ref 6–23)
CO2: 26 meq/L (ref 19–32)
Calcium: 9.5 mg/dL (ref 8.4–10.5)
Chloride: 105 meq/L (ref 96–112)
Creatinine, Ser: 0.85 mg/dL (ref 0.40–1.20)
GFR: 71.41 mL/min (ref >60.00)
Glucose, Bld: 85 mg/dL (ref 70–99)
Potassium: 3.9 meq/L (ref 3.5–5.1)
Sodium: 141 meq/L (ref 135–145)

## 2022-12-05 LAB — TSH: TSH: 1.52 u[IU]/mL (ref 0.35–5.50)

## 2022-12-05 LAB — HEMOGLOBIN A1C: Hgb A1c MFr Bld: 7 % — ABNORMAL HIGH (ref 4.6–6.5)

## 2022-12-05 MED ORDER — FREESTYLE LIBRE 3 PLUS SENSOR MISC: 5 refills | Status: DC

## 2022-12-05 NOTE — Addendum Note (Signed)
Addended by: Rita Ohara D on: 12/05/2022 02:47 PM   Modules accepted: Orders

## 2022-12-05 NOTE — Telephone Encounter (Signed)
4 boxes of Patient assistance Ozempic 1 mg received. Patient is aware and will come pick up.

## 2022-12-08 ENCOUNTER — Telehealth: Payer: Self-pay

## 2022-12-08 NOTE — Telephone Encounter (Signed)
Lvm for pt to call back in regards to labs.   See msg below

## 2022-12-08 NOTE — Telephone Encounter (Signed)
Noted  

## 2022-12-08 NOTE — Telephone Encounter (Signed)
-----   Message from [place] sent at 12/07/2022 11:58 PM EDT ----- Notify - overall sugar control is stable.  A1c 7.0.  continue low carb diet and exercise. Check and record blood sugars and send in reading over the next few weeks. White blood cell count stable.  Still slightly decreased, but stable.  Platelet count wnl. Cholesterol levels look good. Thyroid test, kidney function tests and liver function tests are wnl.

## 2022-12-08 NOTE — Telephone Encounter (Signed)
Patient states she is returning a call from Kristie Cowman, CMA.  I read Dr. Westley Hummer Scott's message to patient.  Patient states she would like for [place] to please mail a copy of her lab results to her.  I mailed a copy of lab results to patient.

## 2022-12-27 ENCOUNTER — Other Ambulatory Visit: Payer: Self-pay | Admitting: Internal Medicine

## 2022-12-27 DIAGNOSIS — E1165 Type 2 diabetes mellitus with hyperglycemia: Secondary | ICD-10-CM

## 2022-12-27 DIAGNOSIS — I10 Essential (primary) hypertension: Secondary | ICD-10-CM

## 2023-01-11 ENCOUNTER — Telehealth: Payer: Self-pay | Admitting: Internal Medicine

## 2023-01-11 DIAGNOSIS — E1165 Type 2 diabetes mellitus with hyperglycemia: Secondary | ICD-10-CM

## 2023-01-11 DIAGNOSIS — I10 Essential (primary) hypertension: Secondary | ICD-10-CM

## 2023-01-11 DIAGNOSIS — E7800 Pure hypercholesterolemia, unspecified: Secondary | ICD-10-CM

## 2023-01-13 NOTE — Telephone Encounter (Signed)
Prescription Request  01/13/2023  LOV: 12/04/2022 rosuvastatin  What is the name of the medication or equipment? Metformin, lisinopril and rosuvastatin   Have you contacted your pharmacy to request a refill? No   Which pharmacy would you like this sent to? [facility]   Patient notified that their request is being sent to the clinical staff for review and that they should receive a response within 2 business days.   Please advise at Mobile [phone] (mobile)

## 2023-01-14 MED ORDER — METFORMIN HCL ER 500 MG PO TB24: ORAL_TABLET | ORAL | 0 refills | Status: DC

## 2023-01-14 MED ORDER — LISINOPRIL-HYDROCHLOROTHIAZIDE 10-12.5 MG PO TABS: 1.0000 | ORAL_TABLET | Freq: Every day | ORAL | 0 refills | Status: DC

## 2023-01-14 NOTE — Telephone Encounter (Signed)
Medications has been refilled. Mychart msg sent

## 2023-01-14 NOTE — Addendum Note (Signed)
Addended by: Kristie Cowman on: 01/14/2023 11:59 AM   Modules accepted: Orders

## 2023-04-23 ENCOUNTER — Telehealth: Payer: Self-pay

## 2023-04-23 NOTE — Telephone Encounter (Signed)
 Pt notified ozempic ready for pick up

## 2023-04-28 NOTE — Telephone Encounter (Signed)
Patient picked up patient assistance medications.

## 2023-05-27 ENCOUNTER — Other Ambulatory Visit

## 2023-05-28 ENCOUNTER — Other Ambulatory Visit: Payer: Self-pay | Admitting: Internal Medicine

## 2023-05-28 DIAGNOSIS — I10 Essential (primary) hypertension: Secondary | ICD-10-CM

## 2023-05-28 DIAGNOSIS — E1165 Type 2 diabetes mellitus with hyperglycemia: Secondary | ICD-10-CM

## 2023-05-28 DIAGNOSIS — E7800 Pure hypercholesterolemia, unspecified: Secondary | ICD-10-CM

## 2023-05-28 NOTE — Telephone Encounter (Signed)
 Copied from CRM [id number]. Topic: Clinical - Medication Refill >> May 28, 2023  4:03 PM Orien Bird wrote: Most Recent Primary Care Visit:  Provider: SCOTT, CHARLENE  Department: [facility]  Visit Type: OFFICE VISIT  Date: 12/04/2022  Medication: metoprolol tartrate (LOPRESSOR) 25 MG tablet, rosuvastatin (CRESTOR) 5 MG tablet, lisinopril-hydrochlorothiazide (ZESTORETIC) 10-12.5 MG tablet, metFORMIN (GLUCOPHAGE-XR) 500 MG 24 hr tablet,   Has the patient contacted their pharmacy? Yes (Agent: If no, request that the patient contact the pharmacy for the refill. If patient does not wish to contact the pharmacy document the reason why and proceed with request.) (Agent: If yes, when and what did the pharmacy advise?)  Is this the correct pharmacy for this prescription? Yes If no, delete pharmacy and type the correct one.  This is the patient's preferred pharmacy:  [facility] [address] ([address]), [place] - [address] [place]) [place] [address] Phone: [phone] Fax: [phone]  Has the prescription been filled recently? No  Is the patient out of the medication? Yes  Has the patient been seen for an appointment in the last year OR does the patient have an upcoming appointment? Yes  Can we respond through MyChart? Yes  Agent: Please be advised that Rx refills may take up to 3 business days. We ask that you follow-up with your pharmacy.

## 2023-06-01 ENCOUNTER — Other Ambulatory Visit: Payer: Self-pay | Admitting: Internal Medicine

## 2023-06-02 ENCOUNTER — Ambulatory Visit: Admitting: Internal Medicine

## 2023-06-04 ENCOUNTER — Other Ambulatory Visit: Payer: Self-pay

## 2023-06-04 DIAGNOSIS — E1165 Type 2 diabetes mellitus with hyperglycemia: Secondary | ICD-10-CM

## 2023-06-04 DIAGNOSIS — E7800 Pure hypercholesterolemia, unspecified: Secondary | ICD-10-CM

## 2023-06-12 ENCOUNTER — Other Ambulatory Visit (INDEPENDENT_AMBULATORY_CARE_PROVIDER_SITE_OTHER)

## 2023-06-12 DIAGNOSIS — E1165 Type 2 diabetes mellitus with hyperglycemia: Secondary | ICD-10-CM

## 2023-06-12 DIAGNOSIS — E7800 Pure hypercholesterolemia, unspecified: Secondary | ICD-10-CM

## 2023-06-12 LAB — BASIC METABOLIC PANEL WITH GFR
BUN: 19 mg/dL (ref 6–23)
CO2: 26 meq/L (ref 19–32)
Calcium: 9.3 mg/dL (ref 8.4–10.5)
Chloride: 106 meq/L (ref 96–112)
Creatinine, Ser: 0.88 mg/dL (ref 0.40–1.20)
GFR: 68.25 mL/min (ref >60.00)
Glucose, Bld: 96 mg/dL (ref 70–99)
Potassium: 4.2 meq/L (ref 3.5–5.1)
Sodium: 139 meq/L (ref 135–145)

## 2023-06-12 LAB — HEPATIC FUNCTION PANEL
ALT: 12 U/L (ref 0–35)
AST: 16 U/L (ref 0–37)
Albumin: 4.2 g/dL (ref 3.5–5.2)
Alkaline Phosphatase: 40 U/L (ref 39–117)
Bilirubin, Direct: 0.1 mg/dL (ref 0.0–0.3)
Total Bilirubin: 0.5 mg/dL (ref 0.2–1.2)
Total Protein: 7 g/dL (ref 6.0–8.3)

## 2023-06-12 LAB — LIPID PANEL
Cholesterol: 144 mg/dL (ref 0–200)
HDL: 66.4 mg/dL (ref >39.00)
LDL Cholesterol: 64 mg/dL (ref 0–99)
NonHDL: 77.76
Total CHOL/HDL Ratio: 2
Triglycerides: 67 mg/dL (ref 0.0–149.0)
VLDL: 13.4 mg/dL (ref 0.0–40.0)

## 2023-06-12 LAB — HEMOGLOBIN A1C: Hgb A1c MFr Bld: 6.9 % — ABNORMAL HIGH (ref 4.6–6.5)

## 2023-06-16 ENCOUNTER — Ambulatory Visit: Admitting: Internal Medicine

## 2023-06-16 ENCOUNTER — Encounter: Payer: Self-pay | Admitting: Internal Medicine

## 2023-06-16 VITALS — BP 104/70 | HR 84 | Temp 98.2°F | Resp 16 | Ht 69.0 in | Wt 261.4 lb

## 2023-06-16 DIAGNOSIS — E7800 Pure hypercholesterolemia, unspecified: Secondary | ICD-10-CM

## 2023-06-16 DIAGNOSIS — E1165 Type 2 diabetes mellitus with hyperglycemia: Secondary | ICD-10-CM

## 2023-06-16 DIAGNOSIS — Z7985 Long-term (current) use of injectable non-insulin antidiabetic drugs: Secondary | ICD-10-CM | POA: Diagnosis not present

## 2023-06-16 DIAGNOSIS — G7000 Myasthenia gravis without (acute) exacerbation: Secondary | ICD-10-CM | POA: Diagnosis not present

## 2023-06-16 DIAGNOSIS — I471 Supraventricular tachycardia, unspecified: Secondary | ICD-10-CM

## 2023-06-16 DIAGNOSIS — I10 Essential (primary) hypertension: Secondary | ICD-10-CM | POA: Diagnosis not present

## 2023-06-16 DIAGNOSIS — Z7984 Long term (current) use of oral hypoglycemic drugs: Secondary | ICD-10-CM | POA: Diagnosis not present

## 2023-06-16 LAB — HM DIABETES FOOT EXAM

## 2023-06-16 MED ORDER — ROSUVASTATIN CALCIUM 5 MG PO TABS: 5.0000 mg | ORAL_TABLET | Freq: Every day | ORAL | 0 refills | Status: DC

## 2023-06-16 MED ORDER — METFORMIN HCL ER 500 MG PO TB24
1500.0000 mg | ORAL_TABLET | Freq: Every day | ORAL | 0 refills | Status: DC
Start: 2023-06-16 — End: 2023-10-15

## 2023-06-16 MED ORDER — METOPROLOL TARTRATE 25 MG PO TABS: 25.0000 mg | ORAL_TABLET | Freq: Two times a day (BID) | ORAL | 0 refills | Status: DC

## 2023-06-16 MED ORDER — LISINOPRIL-HYDROCHLOROTHIAZIDE 10-12.5 MG PO TABS: 1.0000 | ORAL_TABLET | Freq: Every day | ORAL | 0 refills | Status: DC

## 2023-06-16 MED ORDER — FREESTYLE LIBRE 3 PLUS SENSOR MISC: 5 refills | Status: DC

## 2023-06-16 NOTE — Assessment & Plan Note (Signed)
Continue metoprolol.  Stable.

## 2023-06-16 NOTE — Assessment & Plan Note (Signed)
 On ozempic  and metformin .  Has adjusted her diet.  Is staying active. Sugars much improved. Low carb diet and exercise.  Follow met b and A1c. Recent A1c improved. Hold on changing medication. Follow.

## 2023-06-16 NOTE — Assessment & Plan Note (Signed)
 No symptoms.  No further intervention needed at this time. Stable.

## 2023-06-16 NOTE — Progress Notes (Signed)
 Subjective:    Patient ID: Amber Decker, female    DOB: 1955/09/17, 68 y.o.   MRN: [id number]  Patient here for  Chief Complaint  Patient presents with   Medical Management of Chronic Issues    HPI Here for a scheduled follow up - f/u regarding hypercholesterolemia, diabetes and hypertension. Continues on ozempic  and metformin . Tolerating ozempic . Discussed diet and exercise. No chest pain. Breathing stable. No abdominal pain or bowel change. Handling stress. She is walking - three days per week.    Past Medical History:  Diagnosis Date   Diabetes mellitus without complication (HCC) 2008   Herpes    Hypertension    Myasthenia gravis [facility])    onset age 5's?   SVT (supraventricular tachycardia) [facility])    Past Surgical History:  Procedure Laterality Date   BREAST BIOPSY Right 10/24/2022   right breast stereo, X marker, path pending   BREAST BIOPSY Right 10/24/2022   MM RT BREAST BX W LOC DEV 1ST LESION IMAGE BX SPEC STEREO GUIDE 10/24/2022 [name], MD [facility]   MYOMECTOMY     UTERINE FIBROID SURGERY     at [facility]   Family History  Problem Relation Age of Onset   Diabetes Mother    Alcohol abuse Father    Heart disease Father        myocardial infarction   Breast cancer Neg Hx    Colon cancer Neg Hx    Social History   Socioeconomic History   Marital status: Divorced    Spouse name: Not on file   Number of children: 0   Years of education: Not on file   Highest education level: Not on file  Occupational History    Employer: OTHER  Tobacco Use   Smoking status: Never   Smokeless tobacco: Never  Substance and Sexual Activity   Alcohol use: Yes    Alcohol/week: 0.0 standard drinks of alcohol    Comment: occasionally   Drug use: No   Sexual activity: Not on file  Other Topics Concern   Not on file  Social History Narrative   Not on file   Social Drivers of Health   Financial Resource Strain: Low Risk  (07/22/2022)   Overall Financial  Resource Strain (CARDIA)    Difficulty of Paying Living Expenses: Not hard at all  Food Insecurity: No Food Insecurity (07/22/2022)   Hunger Vital Sign    Worried About Running Out of Food in the Last Year: Never true    Ran Out of Food in the Last Year: Never true  Transportation Needs: No Transportation Needs (07/22/2022)   PRAPARE - Administrator, Civil Service (Medical): No    Lack of Transportation (Non-Medical): No  Physical Activity: Sufficiently Active (07/22/2022)   Exercise Vital Sign    Days of Exercise per Week: 4 days    Minutes of Exercise per Session: 50 min  Stress: No Stress Concern Present (07/22/2022)   [employer] of Occupational Health - Occupational Stress Questionnaire    Feeling of Stress : Not at all  Social Connections: Moderately Isolated (07/22/2022)   Social Connection and Isolation Panel [NHANES]    Frequency of Communication with Friends and Family: More than three times a week    Frequency of Social Gatherings with Friends and Family: More than three times a week    Attends Religious Services: More than 4 times per year    Active Member of Clubs or Organizations: No  Attends Banker Meetings: Never    Marital Status: Divorced     Review of Systems  Constitutional:  Negative for appetite change and unexpected weight change.  HENT:  Negative for congestion and sinus pressure.   Respiratory:  Negative for cough, chest tightness and shortness of breath.   Cardiovascular:  Negative for chest pain and palpitations.  Gastrointestinal:  Negative for abdominal pain, diarrhea, nausea and vomiting.  Genitourinary:  Negative for difficulty urinating and dysuria.  Musculoskeletal:  Negative for joint swelling and myalgias.  Skin:  Negative for color change and rash.  Neurological:  Negative for dizziness and headaches.  Psychiatric/Behavioral:  Negative for agitation and dysphoric mood.        Objective:     BP 104/70    Pulse 84   Temp 98.2 F (36.8 C)   Resp 16   Ht 5\' 9"  (1.753 m)   Wt 261 lb 6.4 oz (118.6 kg)   LMP 02/21/2012   SpO2 98%   BMI 38.60 kg/m  Wt Readings from Last 3 Encounters:  06/16/23 261 lb 6.4 oz (118.6 kg)  12/04/22 261 lb (118.4 kg)  07/24/22 260 lb (117.9 kg)    Physical Exam Vitals reviewed.  Constitutional:      General: She is not in acute distress.    Appearance: Normal appearance.  HENT:     Head: Normocephalic and atraumatic.     Right Ear: External ear normal.     Left Ear: External ear normal.     Mouth/Throat:     Pharynx: No oropharyngeal exudate or posterior oropharyngeal erythema.  Eyes:     General: No scleral icterus.       Right eye: No discharge.        Left eye: No discharge.     Conjunctiva/sclera: Conjunctivae normal.  Neck:     Thyroid : No thyromegaly.  Cardiovascular:     Rate and Rhythm: Normal rate and regular rhythm.  Pulmonary:     Effort: No respiratory distress.     Breath sounds: Normal breath sounds. No wheezing.  Abdominal:     General: Bowel sounds are normal.     Palpations: Abdomen is soft.     Tenderness: There is no abdominal tenderness.  Musculoskeletal:        General: No swelling or tenderness.     Cervical back: Neck supple. No tenderness.  Lymphadenopathy:     Cervical: No cervical adenopathy.  Skin:    Findings: No erythema or rash.  Neurological:     Mental Status: She is alert.  Psychiatric:        Mood and Affect: Mood normal.        Behavior: Behavior normal.      Diabetic foot exam was performed with the following findings:   No deformities, ulcerations, or other skin breakdown Normal sensation of 10g monofilament Intact posterior tibialis and dorsalis pedis pulses      Outpatient Encounter Medications as of 06/16/2023  Medication Sig   acyclovir  (ZOVIRAX ) 400 MG tablet Take 1 tablet by mouth once daily   aspirin EC 81 MG tablet Take 81 mg by mouth daily.   Continuous Glucose Sensor (FREESTYLE  LIBRE 3 PLUS SENSOR) MISC Change sensor every 15 days.   GNP MELATONIN PO Take 1 each by mouth daily as needed.   lisinopril -hydrochlorothiazide  (ZESTORETIC ) 10-12.5 MG tablet Take 1 tablet by mouth daily.   Magnesium 250 MG TABS Take 1 tablet by mouth daily.   metFORMIN  (GLUCOPHAGE -XR)  500 MG 24 hr tablet Take 3 tablets (1,500 mg total) by mouth daily.   metoprolol  tartrate (LOPRESSOR ) 25 MG tablet Take 1 tablet (25 mg total) by mouth 2 (two) times daily.   rosuvastatin  (CRESTOR ) 5 MG tablet Take 1 tablet (5 mg total) by mouth daily.   Semaglutide , 1 MG/DOSE, 4 MG/3ML SOPN Inject 1 mg as directed once a week.   TURMERIC PO Take 1 each by mouth daily.   [DISCONTINUED] Continuous Glucose Sensor (FREESTYLE LIBRE 3 PLUS SENSOR) MISC Change sensor every 15 days.   [DISCONTINUED] Continuous Glucose Sensor (FREESTYLE LIBRE 3 SENSOR) MISC PLACE 1 SENSOR ON THE SKIN EVERY 14 DAYS. USE TO CHECK GLUCOSE CONTINUOUSLY   [DISCONTINUED] lisinopril -hydrochlorothiazide  (ZESTORETIC ) 10-12.5 MG tablet Take 1 tablet by mouth once daily   [DISCONTINUED] metFORMIN  (GLUCOPHAGE -XR) 500 MG 24 hr tablet Take 3 tablets by mouth once daily   [DISCONTINUED] metoprolol  tartrate (LOPRESSOR ) 25 MG tablet Take 1 tablet by mouth twice daily   [DISCONTINUED] rosuvastatin  (CRESTOR ) 5 MG tablet Take 1 tablet by mouth once daily   No facility-administered encounter medications on file as of 06/16/2023.     Lab Results  Component Value Date   WBC 3.3 (L) 12/04/2022   HGB 12.8 12/04/2022   HCT 38.8 12/04/2022   PLT 205.0 12/04/2022   GLUCOSE 96 06/12/2023   CHOL 144 06/12/2023   TRIG 67.0 06/12/2023   HDL 66.40 06/12/2023   LDLCALC 64 06/12/2023   ALT 12 06/12/2023   AST 16 06/12/2023   NA 139 06/12/2023   K 4.2 06/12/2023   CL 106 06/12/2023   CREATININE 0.88 06/12/2023   BUN 19 06/12/2023   CO2 26 06/12/2023   TSH 1.52 12/04/2022   HGBA1C 6.9 (H) 06/12/2023   MICROALBUR <0.7 08/19/2022    MM RT BREAST BX W LOC  DEV 1ST LESION IMAGE BX SPEC STEREO GUIDE Addendum Date: 10/27/2022 ADDENDUM REPORT: 10/27/2022 12:56 ADDENDUM: PATHOLOGY revealed: 1. Breast, right, needle core biopsy, upper outer posterior depth calcifications, x clip : - FIBROADENOMATOID CHANGE WITH ASSOCIATED COARSE CALCIFICATIONS.- FOCAL ADENOSIS - NEGATIVE FOR MALIGNANCY. Pathology results are CONCORDANT with imaging findings, per Dr. Clancy Crimes. Pathology results and recommendations were discussed with patient via telephone on 10/27/2022. Patient reported biopsy site doing well with no adverse symptoms, and only slight tenderness at the site. Post biopsy care instructions were reviewed, questions were answered and my direct phone number was provided. Patient was instructed to call [facility] for any additional questions or concerns related to biopsy site. RECOMMENDATION: Patient instructed to resume annual bilateral screening mammogram due September 2025. Pathology results reported by Ladonna Pickup RN on 10/27/2022. Electronically Signed   By: Clancy Crimes M.D.   On: 10/27/2022 12:56   Result Date: 10/27/2022 CLINICAL DATA:  Indeterminate RIGHT breast calcifications EXAM: RIGHT BREAST STEREOTACTIC CORE NEEDLE BIOPSY COMPARISON:  Previous exam(s). FINDINGS: The patient and I discussed the procedure of stereotactic-guided biopsy including benefits and alternatives. We discussed the high likelihood of a successful procedure. We discussed the risks of the procedure including infection, bleeding, tissue injury, clip migration, and inadequate sampling. Informed written consent was given. The usual time out protocol was performed immediately prior to the procedure. Using sterile technique and 1% lidocaine  and 1% lidocaine  with epinephrine  as local anesthetic, under stereotactic guidance, a 9 gauge vacuum assisted device was used to perform core needle biopsy of calcifications in the upper outer quadrant of the RIGHT breast using a lateral  approach. Specimen radiograph was performed showing  representative calcifications. Specimens with calcifications are identified for pathology. Lesion quadrant: Upper outer quadrant At the conclusion of the procedure, an X shaped tissue marker clip was deployed into the biopsy cavity. Follow-up 2-view mammogram was performed and dictated separately. IMPRESSION: Stereotactic-guided biopsy of indeterminate calcifications. No apparent complications. Electronically Signed: By: Clancy Crimes M.D. On: 10/24/2022 12:00   MM CLIP PLACEMENT RIGHT Result Date: 10/24/2022 CLINICAL DATA:  Status post stereotactic guided biopsy EXAM: 3D DIAGNOSTIC RIGHT MAMMOGRAM POST STEREOTACTIC BIOPSY COMPARISON:  Previous exam(s). FINDINGS: 3D Mammographic images were obtained following stereotactic guided biopsy of indeterminate calcifications. The X shaped biopsy marking clip is in expected position at the site of biopsy. IMPRESSION: Appropriate positioning of the X shaped biopsy marking clip at the site of biopsy in the upper outer breast. Final Assessment: Post Procedure Mammograms for Marker Placement BI-RADS CATEGORY  78M: Post-Procedure Mammogram for Marker Placement Electronically Signed   By: Clancy Crimes M.D.   On: 10/24/2022 11:59       Assessment & Plan:  Myasthenia gravis [facility]) Assessment & Plan: No symptoms.  No further intervention needed at this time. Stable.    Essential hypertension, benign Assessment & Plan: Continue lisinopril /hctz and metoprolol .  Blood pressure has been doing well.  Follow pressures.  Follow metabolic panel. No changes in medication.   Orders: -     Lisinopril -hydroCHLOROthiazide ; Take 1 tablet by mouth daily.  Dispense: 90 tablet; Refill: 0 -     Metoprolol  Tartrate; Take 1 tablet (25 mg total) by mouth 2 (two) times daily.  Dispense: 180 tablet; Refill: 0  Type 2 diabetes mellitus with hyperglycemia, without long-term current use of insulin (HCC) Assessment & Plan: On  ozempic  and metformin .  Has adjusted her diet.  Is staying active. Sugars much improved. Low carb diet and exercise.  Follow met b and A1c. Recent A1c improved. Hold on changing medication. Follow.   Orders: -     metFORMIN  HCl ER; Take 3 tablets (1,500 mg total) by mouth daily.  Dispense: 270 tablet; Refill: 0 -     Basic metabolic panel with GFR; Future -     Hemoglobin A1c; Future  Hypercholesteremia Assessment & Plan: Continue crestor .  Low cholesterol diet and exercise.  Follow lipid panel and liver function tests.   Lab Results  Component Value Date   CHOL 144 06/12/2023   HDL 66.40 06/12/2023   LDLCALC 64 06/12/2023   TRIG 67.0 06/12/2023   CHOLHDL 2 06/12/2023     Orders: -     Rosuvastatin  Calcium ; Take 1 tablet (5 mg total) by mouth daily.  Dispense: 90 tablet; Refill: 0 -     Hepatic function panel; Future -     Lipid panel; Future  SVT (supraventricular tachycardia) (HCC) Assessment & Plan: Continue metoprolol .  Stable.    Other orders -     FreeStyle Libre 3 Plus Sensor; Change sensor every 15 days.  Dispense: 2 each; Refill: 5     Dellar Fenton, MD

## 2023-06-16 NOTE — Assessment & Plan Note (Signed)
 Continue lisinopril /hctz and metoprolol .  Blood pressure has been doing well.  Follow pressures.  Follow metabolic panel. No changes in medication.

## 2023-06-16 NOTE — Assessment & Plan Note (Signed)
 Continue crestor .  Low cholesterol diet and exercise.  Follow lipid panel and liver function tests.   Lab Results  Component Value Date   CHOL 144 06/12/2023   HDL 66.40 06/12/2023   LDLCALC 64 06/12/2023   TRIG 67.0 06/12/2023   CHOLHDL 2 06/12/2023

## 2023-07-23 ENCOUNTER — Telehealth: Payer: Self-pay

## 2023-07-23 NOTE — Telephone Encounter (Signed)
 PAP: Patient assistance application for Ozempic  through [employer] has been mailed to pt's home address on file. Provider portion of application will be faxed to provider's office. Patient portion e-filed . Current enrollment ends 08/15/23

## 2023-07-27 ENCOUNTER — Ambulatory Visit (INDEPENDENT_AMBULATORY_CARE_PROVIDER_SITE_OTHER): Payer: PPO | Admitting: *Deleted

## 2023-07-27 VITALS — Ht 69.0 in | Wt 260.0 lb

## 2023-07-27 DIAGNOSIS — Z1159 Encounter for screening for other viral diseases: Secondary | ICD-10-CM | POA: Diagnosis not present

## 2023-07-27 DIAGNOSIS — Z0000 Encounter for general adult medical examination without abnormal findings: Secondary | ICD-10-CM

## 2023-07-27 NOTE — Patient Instructions (Signed)
 Amber Decker , Thank you for taking time out of your busy schedule to complete your Annual Wellness Visit with me. I enjoyed our conversation and look forward to speaking with you again next year. I, as well as your care team,  appreciate your ongoing commitment to your health goals. Please review the following plan we discussed and let me know if I can assist you in the future. Your Game plan/ To Do List    Referrals: If you haven't heard from the office you've been referred to, please reach out to them at the phone provided.  Remember to update your tetanus (Tdap) and shingles vaccines.  Call and schedule an eye appointment.  Your Hepatitis C screening has been ordered and can be done when you have your next lab work. Follow up Visits: Next Medicare AWV with our clinical staff: 07/27/24 @ 3:40   Have you seen your provider in the last 6 months (3 months if uncontrolled diabetes)? Yes Next Office Visit with your provider: 09/24/23   Clinician Recommendations:  Aim for 30 minutes of exercise or brisk walking, 6-8 glasses of water, and 5 servings of fruits and vegetables each day.       This is a list of the screening recommended for you and due dates:  Health Maintenance  Topic Date Due   Hepatitis C Screening  Never done   DTaP/Tdap/Td vaccine (1 - Tdap) Never done   COVID-19 Vaccine (5 - 2024-25 season) 04/29/2023   Eye exam for diabetics  06/27/2023   Yearly kidney health urinalysis for diabetes  08/19/2023   Zoster (Shingles) Vaccine (1 of 2) 09/16/2023*   Colon Cancer Screening  12/04/2023*   Flu Shot  09/11/2023   Mammogram  10/16/2023   Hemoglobin A1C  12/13/2023   Yearly kidney function blood test for diabetes  06/11/2024   Complete foot exam   06/15/2024   Medicare Annual Wellness Visit  07/26/2024   Pneumococcal Vaccine for age over 48  Completed   DEXA scan (bone density measurement)  Completed   HPV Vaccine  Aged Out   Meningitis B Vaccine  Aged Out  *Topic was postponed. The  date shown is not the original due date.    Advanced directives: (Declined) Advance directive discussed with you today. Even though you declined this today, please call our office should you change your mind, and we can give you the proper paperwork for you to fill out. Will pick up paperwork at the office. Advance Care Planning is important because it:  [x]  Makes sure you receive the medical care that is consistent with your values, goals, and preferences  [x]  It provides guidance to your family and loved ones and reduces their decisional burden about whether or not they are making the right decisions based on your wishes.  Follow the link provided in your after visit summary or read over the paperwork we have mailed to you to help you started getting your Advance Directives in place. If you need assistance in completing these, please reach out to us  so that we can help you!

## 2023-07-27 NOTE — Progress Notes (Signed)
 Subjective:   Amber Decker is a 68 y.o. who presents for a Medicare Wellness preventive visit.  As a reminder, Annual Wellness Visits don't include a physical exam, and some assessments may be limited, especially if this visit is performed virtually. We may recommend an in-person follow-up visit with your provider if needed.  Visit Complete: Virtual I connected with  Amber Decker on 07/27/23 by a audio enabled telemedicine application and verified that I am speaking with the correct person using two identifiers.  Patient Location: Home  Provider Location: Home Office  I discussed the limitations of evaluation and management by telemedicine. The patient expressed understanding and agreed to proceed.  Vital Signs: Because this visit was a virtual/telehealth visit, some criteria may be missing or patient reported. Any vitals not documented were not able to be obtained and vitals that have been documented are patient reported.  VideoDeclined- This patient declined Librarian, academic. Therefore the visit was completed with audio only.  Persons Participating in Visit: Patient.  AWV Questionnaire: No: Patient Medicare AWV questionnaire was not completed prior to this visit.  Cardiac Risk Factors include: advanced age (>38men, >72 women);diabetes mellitus;hypertension;dyslipidemia;obesity (BMI >30kg/m2)     Objective:    Today's Vitals   07/27/23 1458  Weight: 260 lb (117.9 kg)  Height: 5' 9 (1.753 m)   Body mass index is 38.4 kg/m.     07/27/2023    3:15 PM 07/22/2022    3:38 PM  Advanced Directives  Does Patient Have a Medical Advance Directive? No No  Would patient like information on creating a medical advance directive? No - Patient declined No - Patient declined    Current Medications (verified) Outpatient Encounter Medications as of 07/27/2023  Medication Sig   acyclovir  (ZOVIRAX ) 400 MG tablet Take 1 tablet by mouth once daily    aspirin EC 81 MG tablet Take 81 mg by mouth daily.   Continuous Glucose Sensor (FREESTYLE LIBRE 3 PLUS SENSOR) MISC Change sensor every 15 days.   GNP MELATONIN PO Take 1 each by mouth daily as needed.   lisinopril -hydrochlorothiazide  (ZESTORETIC ) 10-12.5 MG tablet Take 1 tablet by mouth daily.   Magnesium 250 MG TABS Take 1 tablet by mouth daily.   metFORMIN  (GLUCOPHAGE -XR) 500 MG 24 hr tablet Take 3 tablets (1,500 mg total) by mouth daily.   metoprolol  tartrate (LOPRESSOR ) 25 MG tablet Take 1 tablet (25 mg total) by mouth 2 (two) times daily.   Multiple Vitamin (MULTIVITAMIN) tablet Take 1 tablet by mouth daily.   rosuvastatin  (CRESTOR ) 5 MG tablet Take 1 tablet (5 mg total) by mouth daily.   Semaglutide , 1 MG/DOSE, 4 MG/3ML SOPN Inject 1 mg as directed once a week.   TURMERIC PO Take 1 each by mouth daily.   No facility-administered encounter medications on file as of 07/27/2023.    Allergies (verified) Erythromycin   History: Past Medical History:  Diagnosis Date   Diabetes mellitus without complication (HCC) 2008   Herpes    Hypertension    Myasthenia gravis (HCC)    onset age 34's?   SVT (supraventricular tachycardia) [facility])    Past Surgical History:  Procedure Laterality Date   BREAST BIOPSY Right 10/24/2022   right breast stereo, X marker, path pending   BREAST BIOPSY Right 10/24/2022   MM RT BREAST BX W LOC DEV 1ST LESION IMAGE BX SPEC STEREO GUIDE 10/24/2022 Amber Lea, MD [facility]MO[facility]   MYOMECTOMY     UTERINE FIBROID SURGERY  at [facility] History  Problem Relation Age of Onset   Diabetes Mother    Alcohol abuse Father    Heart disease Father        myocardial infarction   Breast cancer Neg Hx    Colon cancer Neg Hx    Social History   Socioeconomic History   Marital status: Divorced    Spouse name: Not on file   Number of children: 0   Years of education: Not on file   Highest education level: Not on file  Occupational History     Employer: OTHER  Tobacco Use   Smoking status: Never   Smokeless tobacco: Never  Substance and Sexual Activity   Alcohol use: Yes    Alcohol/week: 0.0 standard drinks of alcohol    Comment: occasionally   Drug use: No   Sexual activity: Not on file  Other Topics Concern   Not on file  Social History Narrative   Not on file   Social Drivers of Health   Financial Resource Strain: Low Risk  (07/27/2023)   Overall Financial Resource Strain (CARDIA)    Difficulty of Paying Living Expenses: Not hard at all  Food Insecurity: No Food Insecurity (07/27/2023)   Hunger Vital Sign    Worried About Running Out of Food in the Last Year: Never true    Ran Out of Food in the Last Year: Never true  Transportation Needs: No Transportation Needs (07/27/2023)   PRAPARE - Administrator, Civil Service (Medical): No    Lack of Transportation (Non-Medical): No  Physical Activity: Insufficiently Active (07/27/2023)   Exercise Vital Sign    Days of Exercise per Week: 3 days    Minutes of Exercise per Session: 30 min  Stress: No Stress Concern Present (07/27/2023)   [employer] of Occupational Health - Occupational Stress Questionnaire    Feeling of Stress: Not at all  Social Connections: Moderately Isolated (07/27/2023)   Social Connection and Isolation Panel    Frequency of Communication with Friends and Family: More than three times a week    Frequency of Social Gatherings with Friends and Family: More than three times a week    Attends Religious Services: More than 4 times per year    Active Member of [employer] or Organizations: No    Attends Engineer, structural: Never    Marital Status: Divorced    Tobacco Counseling Counseling given: Not Answered    Clinical Intake:  Pre-visit preparation completed: Yes  Pain : No/denies pain     BMI - recorded: 33.4 Nutritional Status: BMI > 30  Obese Nutritional Risks: None Diabetes: Yes CBG done?: Yes (per patient FBS  112) CBG resulted in Enter/ Edit results?: No Did pt. bring in CBG monitor from home?: No  Lab Results  Component Value Date   HGBA1C 6.9 (H) 06/12/2023   HGBA1C 7.0 (H) 12/04/2022   HGBA1C 7.0 (H) 08/19/2022     How often do you need to have someone help you when you read instructions, pamphlets, or other written materials from your doctor or pharmacy?: 1 - Never  Interpreter Needed?: No  Information entered by :: R. Ladelle Teodoro LPN   Activities of Daily Living     07/27/2023    3:00 PM  In your present state of health, do you have any difficulty performing the following activities:  Hearing? 0  Vision? 0  Comment readers  Difficulty concentrating or making decisions? 0  Walking or  climbing stairs? 0  Dressing or bathing? 0  Doing errands, shopping? 0  Preparing Food and eating ? N  Using the Toilet? N  In the past six months, have you accidently leaked urine? N  Do you have problems with loss of bowel control? N  Managing your Medications? N  Managing your Finances? N  Housekeeping or managing your Housekeeping? N    Patient Care Team: Dellar Fenton, MD as PCP - General (Internal Medicine) Daron Ellen, [facility] (Pharmacist)  I have updated your Care Teams any recent Medical Services you may have received from other providers in the past year.     Assessment:   This is a routine wellness examination for Keslee.  Hearing/Vision screen Hearing Screening - Comments:: No issues Vision Screening - Comments:: readers   Goals Addressed             This Visit's Progress    Patient Stated       Wants to lose more weight       Depression Screen     07/27/2023    3:09 PM 07/22/2022    3:36 PM 11/18/2021    3:42 PM 10/18/2021    3:43 PM 07/11/2021    3:49 PM 06/06/2021    2:41 PM 11/29/2020    2:15 PM  PHQ 2/9 Scores  PHQ - 2 Score 0 0 0 0 0 0 0  PHQ- 9 Score 1 0         Fall Risk     07/27/2023    3:03 PM 07/22/2022    3:38 PM 11/18/2021    3:42 PM 10/18/2021     3:43 PM 07/11/2021    3:48 PM  Fall Risk   Falls in the past year? 0 0 0 0 0  Number falls in past yr: 0 0     Injury with Fall? 0 0     Risk for fall due to : No Fall Risks No Fall Risks No Fall Risks No Fall Risks No Fall Risks  Follow up Falls evaluation completed;Falls prevention discussed Falls prevention discussed;Falls evaluation completed Falls evaluation completed  Falls evaluation completed  Falls evaluation completed      Data saved with a previous flowsheet row definition    MEDICARE RISK AT HOME:  Medicare Risk at Home Any stairs in or around the home?: Yes If so, are there any without handrails?: No Home free of loose throw rugs in walkways, pet beds, electrical cords, etc?: Yes Adequate lighting in your home to reduce risk of falls?: Yes Life alert?: No Use of a cane, walker or w/c?: No Grab bars in the bathroom?: Yes Shower chair or bench in shower?: Yes Elevated toilet seat or a handicapped toilet?: Yes  TIMED UP AND GO:  Was the test performed?  No  Cognitive Function: 6CIT completed        07/27/2023    3:15 PM 07/22/2022    3:49 PM  6CIT Screen  What Year? 0 points 0 points  What month? 0 points 0 points  What time? 0 points 0 points  Count back from 20 0 points 0 points  Months in reverse 0 points 0 points  Repeat phrase 0 points 0 points  Total Score 0 points 0 points    Immunizations Immunization History  Administered Date(s) Administered   Fluad Quad(high Dose 65+) 10/18/2021   Influenza,inj,Quad PF,6+ Mos 11/10/2012, 12/22/2013, 11/13/2014, 12/31/2015, 01/29/2017, 12/22/2018, 11/29/2020   Influenza-Unspecified 11/02/2017, 12/10/2019, 10/30/2022   Moderna  Covid-19 Fall Seasonal Vaccine 73yrs & older 10/30/2022   PFIZER(Purple Top)SARS-COV-2 Vaccination 04/05/2019, 04/26/2019, 11/28/2019   PNEUMOCOCCAL CONJUGATE-20 03/13/2022    Screening Tests Health Maintenance  Topic Date Due   Hepatitis C Screening  Never done   DTaP/Tdap/Td (1 -  Tdap) Never done   COVID-19 Vaccine (5 - 2024-25 season) 04/29/2023   Medicare Annual Wellness (AWV)  07/22/2023   OPHTHALMOLOGY EXAM  06/27/2023   Diabetic kidney evaluation - Urine ACR  08/19/2023   Zoster Vaccines- Shingrix (1 of 2) 09/16/2023 (Originally 07/17/2006)   Colonoscopy  12/04/2023 (Originally 07/16/2001)   INFLUENZA VACCINE  09/11/2023   MAMMOGRAM  10/16/2023   HEMOGLOBIN A1C  12/13/2023   Diabetic kidney evaluation - eGFR measurement  06/11/2024   FOOT EXAM  06/15/2024   Pneumococcal Vaccine: 50+ Years  Completed   DEXA SCAN  Completed   HPV VACCINES  Aged Out   Meningococcal B Vaccine  Aged Out    Health Maintenance  Health Maintenance Due  Topic Date Due   Hepatitis C Screening  Never done   DTaP/Tdap/Td (1 - Tdap) Never done   COVID-19 Vaccine (5 - 2024-25 season) 04/29/2023   Medicare Annual Wellness (AWV)  07/22/2023   OPHTHALMOLOGY EXAM  06/27/2023   Diabetic kidney evaluation - Urine ACR  08/19/2023   Health Maintenance Items Addressed: Labs Ordered: Hepatitis C screening ordered. Patient reminded to update her tetanus (Tdap) vaccine. Patient needs shingles vaccines. Patient stated last week she sent in a stool test to her insurance company and the results should be sent to Dr. Geralyn Knee when test is processed.   Additional Screening:  Vision Screening: Recommended annual ophthalmology exams for early detection of glaucoma and other disorders of the eye.Overdue Patient stated that she will call and schedule an eye appointment with an eye doctor Would you like a referral to an eye doctor? No    Dental Screening: Recommended annual dental exams for proper oral hygiene  Community Resource Referral / Chronic Care Management: CRR required this visit?  No   CCM required this visit?  No   Plan:    I have personally reviewed and noted the following in the patient's chart:   Medical and social history Use of alcohol, tobacco or illicit drugs  Current  medications and supplements including opioid prescriptions. Patient is not currently taking opioid prescriptions. Functional ability and status Nutritional status Physical activity Advanced directives List of other physicians Hospitalizations, surgeries, and ER visits in previous 12 months Vitals Screenings to include cognitive, depression, and falls Referrals and appointments  In addition, I have reviewed and discussed with patient certain preventive protocols, quality metrics, and best practice recommendations. A written personalized care plan for preventive services as well as general preventive health recommendations were provided to patient.   Felicitas Horse, LPN   02/29/1476   After Visit Summary: (MyChart) Due to this being a telephonic visit, the after visit summary with patients personalized plan was offered to patient via MyChart   Notes: Nothing significant to report at this time.

## 2023-07-31 ENCOUNTER — Telehealth: Payer: Self-pay

## 2023-07-31 NOTE — Telephone Encounter (Signed)
 Copied from CRM [id number]. Topic: Clinical - Lab/Test Results >> Jul 31, 2023  3:25 PM Aisha D wrote: Reason for CRM: Andy Bannister with [employer] calls is requesting to speak with Dr.Scott or her nurse regarding abnormal lab results for the pt. Callback number is [phone].

## 2023-08-05 NOTE — Telephone Encounter (Signed)
 Abnormal FOBT.

## 2023-08-05 NOTE — Telephone Encounter (Signed)
 Reviewed the lab report - her stool test was positive for blood. I do not see where she has seen GI. If she has, need report and need colonoscopy report. If has not seen GI, she needs referral for positive stool test. Need to confirm if she has a preference of which GI MD she prefers to see.

## 2023-08-05 NOTE — Telephone Encounter (Signed)
 Faxed provider portion of PAP application Ozempic  (Novo Nordisk) again for review and signature.

## 2023-08-05 NOTE — Telephone Encounter (Signed)
 Awaiting fax from [employer] so we can add to chart and discuss with patient. Per [employer] nurse, patient is aware of abnormal result.

## 2023-08-06 NOTE — Telephone Encounter (Signed)
 Lvm for pt to return call. Okay to relay if relayed please notify office.

## 2023-08-07 NOTE — Telephone Encounter (Signed)
 LM for patient. Ok to [employer] and notify office.

## 2023-08-11 NOTE — Telephone Encounter (Signed)
 Called Patient with positive stool test and asked if she had seen GI. Patient stated no, so I asked if she would be willing to see GI and she is agreeable. Patient states she will call back with her preference of which GI MD she would like to see.

## 2023-08-11 NOTE — Telephone Encounter (Signed)
Noted.  Hold until receive call back.

## 2023-08-18 NOTE — Telephone Encounter (Signed)
 Received a letter from [employer]. Card [employer]. ,faxed it today to Novo nordisk along the letter they send. To be follow.

## 2023-08-20 NOTE — Telephone Encounter (Signed)
 Called patient to follow up with her about this. She still has not decided who she wants to see. She will call back

## 2023-08-21 NOTE — Progress Notes (Signed)
 Pharmacy Medication Assistance Program Note    08/21/2023  Patient ID: Amber Decker, female   DOB: 05/05/1955, 68 y.o.   MRN: [id number]     08/21/2023  Outreach Medication One  Manufacturer Medication One Novo Nordisk  Nord[employer] [facility] Ozempic   Type of Sport and exercise psychologist  Patient Assistance Determination Approved  Approval Start Date 08/21/2023  Approval End Date 02/10/2024  Patient Notification Method Telephone Call     Signature

## 2023-08-21 NOTE — Telephone Encounter (Signed)
 PAP: Patient assistance application for Ozempic  has been approved by PAP Companies: NovoNordisk from 08/21/2023 to 02/10/2024. Medication should be delivered to PAP Delivery: Provider's office. For further shipping updates, please contact Novo Nordisk at [phone]. Patient ID is: [id number]

## 2023-09-03 NOTE — Telephone Encounter (Signed)
 Lvm for pt to give office a call back. Okay to relay message below. Once relayed please notify office

## 2023-09-08 NOTE — Telephone Encounter (Signed)
 Pt has been called several times for a GI referral. Will discuss at August appt.

## 2023-09-14 ENCOUNTER — Telehealth: Payer: Self-pay | Admitting: *Deleted

## 2023-09-14 NOTE — Telephone Encounter (Signed)
 Patient assistance medication available for pick up     Medication Ozempic  4mg /69ml Exp: 12/10/2025 Lot number: [id number] Qty: 4 boxes

## 2023-09-22 ENCOUNTER — Other Ambulatory Visit

## 2023-09-24 ENCOUNTER — Encounter: Admitting: Internal Medicine

## 2023-10-05 ENCOUNTER — Other Ambulatory Visit: Payer: Self-pay | Admitting: Internal Medicine

## 2023-10-05 ENCOUNTER — Telehealth: Payer: Self-pay

## 2023-10-05 DIAGNOSIS — Z1231 Encounter for screening mammogram for malignant neoplasm of breast: Secondary | ICD-10-CM

## 2023-10-05 NOTE — Telephone Encounter (Signed)
 Copied from CRM #[id number]. Topic: General - Other >> Oct 05, 2023 12:25 PM Mercedes MATSU wrote: Reason for CRM: Patient states she has had a couple of missed calls and is unsure as to why, patient is requesting a call back at [phone]. Patient is also wanting to when you can come pick up her injections for her glucose.

## 2023-10-06 NOTE — Telephone Encounter (Signed)
 Called patient to let her know that per note, ozempic  has been ready for pick up. Other calls were regarding colonoscopy. Pt says she will discuss that with Dr Glendia at appt next week and pick up ozempic  tomorrow.

## 2023-10-07 ENCOUNTER — Other Ambulatory Visit (INDEPENDENT_AMBULATORY_CARE_PROVIDER_SITE_OTHER)

## 2023-10-07 ENCOUNTER — Telehealth: Payer: Self-pay

## 2023-10-07 DIAGNOSIS — E1165 Type 2 diabetes mellitus with hyperglycemia: Secondary | ICD-10-CM | POA: Diagnosis not present

## 2023-10-07 DIAGNOSIS — E7800 Pure hypercholesterolemia, unspecified: Secondary | ICD-10-CM | POA: Diagnosis not present

## 2023-10-07 LAB — HEPATIC FUNCTION PANEL
ALT: 12 U/L (ref 0–35)
AST: 16 U/L (ref 0–37)
Albumin: 4.3 g/dL (ref 3.5–5.2)
Alkaline Phosphatase: 47 U/L (ref 39–117)
Bilirubin, Direct: 0.1 mg/dL (ref 0.0–0.3)
Total Bilirubin: 0.4 mg/dL (ref 0.2–1.2)
Total Protein: 7.4 g/dL (ref 6.0–8.3)

## 2023-10-07 LAB — LIPID PANEL
Cholesterol: 151 mg/dL (ref 0–200)
HDL: 64.9 mg/dL (ref >39.00)
LDL Cholesterol: 71 mg/dL (ref 0–99)
NonHDL: 85.71
Total CHOL/HDL Ratio: 2
Triglycerides: 73 mg/dL (ref 0.0–149.0)
VLDL: 14.6 mg/dL (ref 0.0–40.0)

## 2023-10-07 LAB — BASIC METABOLIC PANEL WITH GFR
BUN: 21 mg/dL (ref 6–23)
CO2: 26 meq/L (ref 19–32)
Calcium: 9.3 mg/dL (ref 8.4–10.5)
Chloride: 105 meq/L (ref 96–112)
Creatinine, Ser: 0.91 mg/dL (ref 0.40–1.20)
GFR: 65.41 mL/min (ref >60.00)
Glucose, Bld: 141 mg/dL — ABNORMAL HIGH (ref 70–99)
Potassium: 4.3 meq/L (ref 3.5–5.1)
Sodium: 141 meq/L (ref 135–145)

## 2023-10-07 LAB — HEMOGLOBIN A1C: Hgb A1c MFr Bld: 7.2 % — ABNORMAL HIGH (ref 4.6–6.5)

## 2023-10-07 NOTE — Telephone Encounter (Signed)
 Pt came into office and picked up pt assistance medication Ozempic  (4 boxes) on 10/07/23 @ 9:30

## 2023-10-08 ENCOUNTER — Ambulatory Visit: Payer: Self-pay | Admitting: Internal Medicine

## 2023-10-08 ENCOUNTER — Encounter: Admitting: Internal Medicine

## 2023-10-15 ENCOUNTER — Encounter: Payer: Self-pay | Admitting: Internal Medicine

## 2023-10-15 ENCOUNTER — Ambulatory Visit: Admitting: Internal Medicine

## 2023-10-15 VITALS — BP 122/70 | HR 76 | Resp 16 | Ht 69.0 in | Wt 259.0 lb

## 2023-10-15 DIAGNOSIS — Z1211 Encounter for screening for malignant neoplasm of colon: Secondary | ICD-10-CM | POA: Diagnosis not present

## 2023-10-15 DIAGNOSIS — E7800 Pure hypercholesterolemia, unspecified: Secondary | ICD-10-CM | POA: Diagnosis not present

## 2023-10-15 DIAGNOSIS — Z0000 Encounter for general adult medical examination without abnormal findings: Secondary | ICD-10-CM | POA: Diagnosis not present

## 2023-10-15 DIAGNOSIS — G7000 Myasthenia gravis without (acute) exacerbation: Secondary | ICD-10-CM | POA: Diagnosis not present

## 2023-10-15 DIAGNOSIS — Z23 Encounter for immunization: Secondary | ICD-10-CM | POA: Diagnosis not present

## 2023-10-15 DIAGNOSIS — I10 Essential (primary) hypertension: Secondary | ICD-10-CM

## 2023-10-15 DIAGNOSIS — Z7984 Long term (current) use of oral hypoglycemic drugs: Secondary | ICD-10-CM

## 2023-10-15 DIAGNOSIS — E1165 Type 2 diabetes mellitus with hyperglycemia: Secondary | ICD-10-CM | POA: Diagnosis not present

## 2023-10-15 MED ORDER — ROSUVASTATIN CALCIUM 5 MG PO TABS: 5.0000 mg | ORAL_TABLET | Freq: Every day | ORAL | 1 refills | Status: DC

## 2023-10-15 MED ORDER — FREESTYLE LIBRE 3 PLUS SENSOR MISC: 5 refills | Status: AC

## 2023-10-15 MED ORDER — LISINOPRIL-HYDROCHLOROTHIAZIDE 10-12.5 MG PO TABS: 1.0000 | ORAL_TABLET | Freq: Every day | ORAL | 1 refills | Status: DC

## 2023-10-15 MED ORDER — METOPROLOL TARTRATE 25 MG PO TABS: 25.0000 mg | ORAL_TABLET | Freq: Two times a day (BID) | ORAL | 1 refills | Status: DC

## 2023-10-15 MED ORDER — METFORMIN HCL ER 500 MG PO TB24: 1500.0000 mg | ORAL_TABLET | Freq: Every day | ORAL | 1 refills | Status: DC

## 2023-10-15 NOTE — Assessment & Plan Note (Addendum)
 Physical today 10/15/23.  Bilateral diagnostic mammogram 08/27/21 [facility] - Bilateral diagnostic mammogram (with RIGHT and LEFT breast ultrasound as deemed necessary) in 1 year   had f/u mammogram 10/2022 - biopsy -   Breast, right, needle core biopsy, upper outer posterior depth calcifications, x clip : - FIBROADENOMATOID CHANGE WITH ASSOCIATED COARSE CALCIFICATIONS.- FOCAL ADENOSIS - NEGATIVE FOR MALIGNANCY scheduled for f/u mammogram 10/29/23. SABRA Refer to GI for colonoscopy. PAP - 08/23/20 - negative with negative HPV.

## 2023-10-15 NOTE — Progress Notes (Signed)
 Subjective:    Patient ID: Amber Decker, female    DOB: September 18, 1955, 68 y.o.   MRN: [id number]  Patient here for  Chief Complaint  Patient presents with   Annual Exam    HPI Here for a physical exam. Continues on ozempic  and metformin . Discussed recent labs.  A1c increased to 7.2. she had been off ozempic  for 3 weeks. Reviewed CGM 10/02/23 - 10/13/23 - 21% active. She had issues with CGM. Just placed several days ago. Sugars - target range 98%, high 2%. Discussed diet and exercise. Breathing stable. No chest pain. No abdominal pain or bowel change. Discussed due colonoscopy.    Past Medical History:  Diagnosis Date   Diabetes mellitus without complication (HCC) 2008   Herpes    Hypertension    Myasthenia gravis [facility]    onset age 49's?   SVT (supraventricular tachycardia) [facility])    Past Surgical History:  Procedure Laterality Date   BREAST BIOPSY Right 10/24/2022   right breast stereo, X marker, path pending   BREAST BIOPSY Right 10/24/2022   MM RT BREAST BX W LOC DEV 1ST LESION IMAGE BX SPEC STEREO GUIDE 10/24/2022 Correne Corean KIDD, MD [facility]   MYOMECTOMY     UTERINE FIBROID SURGERY     at [facility]   Family History  Problem Relation Age of Onset   Diabetes Mother    Alcohol abuse Father    Heart disease Father        myocardial infarction   Breast cancer Neg Hx    Colon cancer Neg Hx    Social History   Socioeconomic History   Marital status: Divorced    Spouse name: Not on file   Number of children: 0   Years of education: Not on file   Highest education level: Not on file  Occupational History    Employer: OTHER  Tobacco Use   Smoking status: Never   Smokeless tobacco: Never  Substance and Sexual Activity   Alcohol use: Yes    Alcohol/week: 0.0 standard drinks of alcohol    Comment: occasionally   Drug use: No   Sexual activity: Not on file  Other Topics Concern   Not on file  Social History Narrative   Not on file   Social Drivers of  Health   Financial Resource Strain: Low Risk  (07/27/2023)   Overall Financial Resource Strain (CARDIA)    Difficulty of Paying Living Expenses: Not hard at all  Food Insecurity: No Food Insecurity (07/27/2023)   Hunger Vital Sign    Worried About Running Out of Food in the Last Year: Never true    Ran Out of Food in the Last Year: Never true  Transportation Needs: No Transportation Needs (07/27/2023)   PRAPARE - Administrator, Civil Service (Medical): No    Lack of Transportation (Non-Medical): No  Physical Activity: Insufficiently Active (07/27/2023)   Exercise Vital Sign    Days of Exercise per Week: 3 days    Minutes of Exercise per Session: 30 min  Stress: No Stress Concern Present (07/27/2023)   [employer] of Occupational Health - Occupational Stress Questionnaire    Feeling of Stress: Not at all  Social Connections: Moderately Isolated (07/27/2023)   Social Connection and Isolation Panel    Frequency of Communication with Friends and Family: More than three times a week    Frequency of Social Gatherings with Friends and Family: More than three times a week    Attends Religious  Services: More than 4 times per year    Active Member of Clubs or Organizations: No    Attends Banker Meetings: Never    Marital Status: Divorced     Review of Systems  Constitutional:  Negative for appetite change and unexpected weight change.  HENT:  Negative for congestion and sinus pressure.   Respiratory:  Negative for cough, chest tightness and shortness of breath.   Cardiovascular:  Negative for chest pain, palpitations and leg swelling.  Gastrointestinal:  Negative for abdominal pain, diarrhea, nausea and vomiting.  Genitourinary:  Negative for difficulty urinating and dysuria.  Musculoskeletal:  Negative for joint swelling and myalgias.  Skin:  Negative for color change and rash.  Neurological:  Negative for dizziness and headaches.  Psychiatric/Behavioral:   Negative for agitation and dysphoric mood.        Objective:     BP 122/70   Pulse 76   Resp 16   Ht 5' 9 (1.753 m)   Wt 259 lb (117.5 kg)   LMP 02/21/2012   SpO2 98%   BMI 38.25 kg/m  Wt Readings from Last 3 Encounters:  10/15/23 259 lb (117.5 kg)  07/27/23 260 lb (117.9 kg)  06/16/23 261 lb 6.4 oz (118.6 kg)    Physical Exam Vitals reviewed.  Constitutional:      General: She is not in acute distress.    Appearance: Normal appearance.  HENT:     Head: Normocephalic and atraumatic.     Right Ear: External ear normal.     Left Ear: External ear normal.     Mouth/Throat:     Pharynx: No oropharyngeal exudate or posterior oropharyngeal erythema.  Eyes:     General: No scleral icterus.       Right eye: No discharge.        Left eye: No discharge.     Conjunctiva/sclera: Conjunctivae normal.  Neck:     Thyroid : No thyromegaly.  Cardiovascular:     Rate and Rhythm: Normal rate and regular rhythm.  Pulmonary:     Effort: No respiratory distress.     Breath sounds: Normal breath sounds. No wheezing.  Abdominal:     General: Bowel sounds are normal.     Palpations: Abdomen is soft.     Tenderness: There is no abdominal tenderness.  Musculoskeletal:        General: No swelling or tenderness.     Cervical back: Neck supple. No tenderness.  Lymphadenopathy:     Cervical: No cervical adenopathy.  Skin:    Findings: No erythema or rash.  Neurological:     Mental Status: She is alert.  Psychiatric:        Mood and Affect: Mood normal.        Behavior: Behavior normal.         Outpatient Encounter Medications as of 10/15/2023  Medication Sig   aspirin EC 81 MG tablet Take 81 mg by mouth daily.   Continuous Glucose Sensor (FREESTYLE LIBRE 3 PLUS SENSOR) MISC Change sensor every 15 days.   GNP MELATONIN PO Take 1 each by mouth daily as needed.   lisinopril -hydrochlorothiazide  (ZESTORETIC ) 10-12.5 MG tablet Take 1 tablet by mouth daily.   Magnesium 250 MG TABS  Take 1 tablet by mouth daily.   metFORMIN  (GLUCOPHAGE -XR) 500 MG 24 hr tablet Take 3 tablets (1,500 mg total) by mouth daily.   metoprolol  tartrate (LOPRESSOR ) 25 MG tablet Take 1 tablet (25 mg total) by mouth 2 (two) times  daily.   Multiple Vitamin (MULTIVITAMIN) tablet Take 1 tablet by mouth daily.   rosuvastatin  (CRESTOR ) 5 MG tablet Take 1 tablet (5 mg total) by mouth daily.   Semaglutide , 1 MG/DOSE, 4 MG/3ML SOPN Inject 1 mg as directed once a week.   TURMERIC PO Take 1 each by mouth daily.   [DISCONTINUED] acyclovir  (ZOVIRAX ) 400 MG tablet Take 1 tablet by mouth once daily   [DISCONTINUED] Continuous Glucose Sensor (FREESTYLE LIBRE 3 PLUS SENSOR) MISC Change sensor every 15 days.   [DISCONTINUED] lisinopril -hydrochlorothiazide  (ZESTORETIC ) 10-12.5 MG tablet Take 1 tablet by mouth daily.   [DISCONTINUED] metFORMIN  (GLUCOPHAGE -XR) 500 MG 24 hr tablet Take 3 tablets (1,500 mg total) by mouth daily.   [DISCONTINUED] metoprolol  tartrate (LOPRESSOR ) 25 MG tablet Take 1 tablet (25 mg total) by mouth 2 (two) times daily.   [DISCONTINUED] rosuvastatin  (CRESTOR ) 5 MG tablet Take 1 tablet (5 mg total) by mouth daily.   No facility-administered encounter medications on file as of 10/15/2023.     Lab Results  Component Value Date   WBC 3.3 (L) 12/04/2022   HGB 12.8 12/04/2022   HCT 38.8 12/04/2022   PLT 205.0 12/04/2022   GLUCOSE 141 (H) 10/07/2023   CHOL 151 10/07/2023   TRIG 73.0 10/07/2023   HDL 64.90 10/07/2023   LDLCALC 71 10/07/2023   ALT 12 10/07/2023   AST 16 10/07/2023   NA 141 10/07/2023   K 4.3 10/07/2023   CL 105 10/07/2023   CREATININE 0.91 10/07/2023   BUN 21 10/07/2023   CO2 26 10/07/2023   TSH 1.52 12/04/2022   HGBA1C 7.2 (H) 10/07/2023    MM RT BREAST BX W LOC DEV 1ST LESION IMAGE BX SPEC STEREO GUIDE Addendum Date: 10/27/2022 ADDENDUM REPORT: 10/27/2022 12:56 ADDENDUM: PATHOLOGY revealed: 1. Breast, right, needle core biopsy, upper outer posterior depth  calcifications, x clip : - FIBROADENOMATOID CHANGE WITH ASSOCIATED COARSE CALCIFICATIONS.- FOCAL ADENOSIS - NEGATIVE FOR MALIGNANCY. Pathology results are CONCORDANT with imaging findings, per Dr. Corean Salter. Pathology results and recommendations were discussed with patient via telephone on 10/27/2022. Patient reported biopsy site doing well with no adverse symptoms, and only slight tenderness at the site. Post biopsy care instructions were reviewed, questions were answered and my direct phone number was provided. Patient was instructed to call [facility] for any additional questions or concerns related to biopsy site. RECOMMENDATION: Patient instructed to resume annual bilateral screening mammogram due September 2025. Pathology results reported by Rock Hover RN on 10/27/2022. Electronically Signed   By: Corean Salter M.D.   On: 10/27/2022 12:56   Result Date: 10/27/2022 CLINICAL DATA:  Indeterminate RIGHT breast calcifications EXAM: RIGHT BREAST STEREOTACTIC CORE NEEDLE BIOPSY COMPARISON:  Previous exam(s). FINDINGS: The patient and I discussed the procedure of stereotactic-guided biopsy including benefits and alternatives. We discussed the high likelihood of a successful procedure. We discussed the risks of the procedure including infection, bleeding, tissue injury, clip migration, and inadequate sampling. Informed written consent was given. The usual time out protocol was performed immediately prior to the procedure. Using sterile technique and 1% lidocaine  and 1% lidocaine  with epinephrine  as local anesthetic, under stereotactic guidance, a 9 gauge vacuum assisted device was used to perform core needle biopsy of calcifications in the upper outer quadrant of the RIGHT breast using a lateral approach. Specimen radiograph was performed showing representative calcifications. Specimens with calcifications are identified for pathology. Lesion quadrant: Upper outer quadrant At the conclusion of  the procedure, an X shaped tissue marker clip was  deployed into the biopsy cavity. Follow-up 2-view mammogram was performed and dictated separately. IMPRESSION: Stereotactic-guided biopsy of indeterminate calcifications. No apparent complications. Electronically Signed: By: Corean Salter M.D. On: 10/24/2022 12:00   MM CLIP PLACEMENT RIGHT Result Date: 10/24/2022 CLINICAL DATA:  Status post stereotactic guided biopsy EXAM: 3D DIAGNOSTIC RIGHT MAMMOGRAM POST STEREOTACTIC BIOPSY COMPARISON:  Previous exam(s). FINDINGS: 3D Mammographic images were obtained following stereotactic guided biopsy of indeterminate calcifications. The X shaped biopsy marking clip is in expected position at the site of biopsy. IMPRESSION: Appropriate positioning of the X shaped biopsy marking clip at the site of biopsy in the upper outer breast. Final Assessment: Post Procedure Mammograms for Marker Placement BI-RADS CATEGORY  34M: Post-Procedure Mammogram for Marker Placement Electronically Signed   By: Corean Salter M.D.   On: 10/24/2022 11:59       Assessment & Plan:  Routine general medical examination at a health care facility  Health care maintenance Assessment & Plan: Physical today 10/15/23.  Bilateral diagnostic mammogram 08/27/21 [facility] - Bilateral diagnostic mammogram (with RIGHT and LEFT breast ultrasound as deemed necessary) in 1 year   had f/u mammogram 10/2022 - biopsy -   Breast, right, needle core biopsy, upper outer posterior depth calcifications, x clip : - FIBROADENOMATOID CHANGE WITH ASSOCIATED COARSE CALCIFICATIONS.- FOCAL ADENOSIS - NEGATIVE FOR MALIGNANCY scheduled for f/u mammogram 10/29/23. SABRA Refer to GI for colonoscopy. PAP - 08/23/20 - negative with negative HPV.    Type 2 diabetes mellitus with hyperglycemia, without long-term current use of insulin (HCC) Assessment & Plan: Remain on ozempic  and metformin .  Is staying active. Discussed diet and exercise. A1c increased. Sugar as outlined.   Follow met b and A1c. Wants to continue to work on diet and exercise.  Hold on changing medication. Follow.   Orders: -     Basic metabolic panel with GFR; Future -     Hemoglobin A1c; Future -     Microalbumin / creatinine urine ratio; Future -     metFORMIN  HCl ER; Take 3 tablets (1,500 mg total) by mouth daily.  Dispense: 270 tablet; Refill: 1  Hypercholesteremia Assessment & Plan: Continue crestor .  Low cholesterol diet and exercise.  Follow lipid panel and liver function tests.   Lab Results  Component Value Date   CHOL 151 10/07/2023   HDL 64.90 10/07/2023   LDLCALC 71 10/07/2023   TRIG 73.0 10/07/2023   CHOLHDL 2 10/07/2023     Orders: -     Hepatic function panel; Future -     Lipid panel; Future -     CBC with Differential/Platelet; Future -     TSH; Future -     Rosuvastatin  Calcium ; Take 1 tablet (5 mg total) by mouth daily.  Dispense: 90 tablet; Refill: 1  Colon cancer screening Assessment & Plan: Overdue colon cancer screening. Order placed for GI referral.   Orders: -     Ambulatory referral to Gastroenterology  Immunization due -     Flu vaccine HIGH DOSE PF(Fluzone Trivalent)  Essential hypertension, benign Assessment & Plan: Continue lisinopril /hctz and metoprolol .  Blood pressure has been doing well.  Follow pressures. Follow metabolic panel. No changes today in her medication regimen.   Orders: -     Lisinopril -hydroCHLOROthiazide ; Take 1 tablet by mouth daily.  Dispense: 90 tablet; Refill: 1 -     Metoprolol  Tartrate; Take 1 tablet (25 mg total) by mouth 2 (two) times daily.  Dispense: 180 tablet; Refill: 1  Myasthenia gravis (HCC)  Assessment & Plan: Stable. No symptoms. No symptoms.    Other orders -     FreeStyle Libre 3 Plus Sensor; Change sensor every 15 days.  Dispense: 2 each; Refill: 5     Allena Hamilton, MD

## 2023-10-20 ENCOUNTER — Other Ambulatory Visit: Payer: Self-pay | Admitting: Internal Medicine

## 2023-10-20 ENCOUNTER — Encounter: Payer: Self-pay | Admitting: Internal Medicine

## 2023-10-20 NOTE — Assessment & Plan Note (Signed)
 Overdue colon cancer screening. Order placed for GI referral.

## 2023-10-20 NOTE — Assessment & Plan Note (Signed)
 Stable. No symptoms. No symptoms.

## 2023-10-20 NOTE — Assessment & Plan Note (Signed)
 Continue crestor .  Low cholesterol diet and exercise.  Follow lipid panel and liver function tests.   Lab Results  Component Value Date   CHOL 151 10/07/2023   HDL 64.90 10/07/2023   LDLCALC 71 10/07/2023   TRIG 73.0 10/07/2023   CHOLHDL 2 10/07/2023

## 2023-10-20 NOTE — Assessment & Plan Note (Signed)
 Remain on ozempic  and metformin .  Is staying active. Discussed diet and exercise. A1c increased. Sugar as outlined.  Follow met b and A1c. Wants to continue to work on diet and exercise.  Hold on changing medication. Follow.

## 2023-10-20 NOTE — Assessment & Plan Note (Signed)
 Continue lisinopril /hctz and metoprolol .  Blood pressure has been doing well.  Follow pressures. Follow metabolic panel. No changes today in her medication regimen.

## 2023-10-29 ENCOUNTER — Ambulatory Visit
Admission: RE | Admit: 2023-10-29 | Discharge: 2023-10-29 | Disposition: A | Discharge status: Home / Self Care | Source: Ambulatory Visit | Attending: Internal Medicine | Admitting: Internal Medicine

## 2023-10-29 DIAGNOSIS — Z1231 Encounter for screening mammogram for malignant neoplasm of breast: Secondary | ICD-10-CM | POA: Insufficient documentation

## 2023-11-17 ENCOUNTER — Encounter: Payer: Self-pay | Admitting: Pharmacist

## 2023-11-27 ENCOUNTER — Telehealth: Payer: Self-pay

## 2023-11-27 NOTE — Telephone Encounter (Signed)
 Completed refill form for Ozempic  and faxed to provider for review and signature.

## 2023-12-01 ENCOUNTER — Encounter: Payer: Self-pay | Admitting: Pharmacist

## 2023-12-01 NOTE — Progress Notes (Signed)
 Mychart message sent to patient on 11/17/23 regarding changes to [employer] Patient Assistance Program in 2026. [employer] will no longer be offering Ozempic  or Rybelsus  to Medicare patients in 2026.   Mychart message not read as of 12/01/23. Letter sent to patient via mail.

## 2023-12-28 NOTE — Telephone Encounter (Signed)
 Patient notified that Patient Assistance Medication are in the office & are ready for pick up.   Medication: Ozempic  4mg /59ml  Quantity: 2 boxes  Lot# [id number]  Exp: 02/09/2026

## 2023-12-30 NOTE — Telephone Encounter (Signed)
Pt has picked up medication.  

## 2024-02-07 ENCOUNTER — Other Ambulatory Visit: Payer: Self-pay | Admitting: Internal Medicine

## 2024-02-18 ENCOUNTER — Ambulatory Visit: Payer: Self-pay | Admitting: Internal Medicine

## 2024-02-18 ENCOUNTER — Other Ambulatory Visit (INDEPENDENT_AMBULATORY_CARE_PROVIDER_SITE_OTHER)

## 2024-02-18 ENCOUNTER — Telehealth: Payer: Self-pay

## 2024-02-18 DIAGNOSIS — E7800 Pure hypercholesterolemia, unspecified: Secondary | ICD-10-CM

## 2024-02-18 DIAGNOSIS — E1165 Type 2 diabetes mellitus with hyperglycemia: Secondary | ICD-10-CM | POA: Diagnosis not present

## 2024-02-18 LAB — HEPATIC FUNCTION PANEL
ALT: 13 U/L (ref 3–35)
AST: 16 U/L (ref 5–37)
Albumin: 4.1 g/dL (ref 3.5–5.2)
Alkaline Phosphatase: 49 U/L (ref 39–117)
Bilirubin, Direct: 0.1 mg/dL (ref 0.1–0.3)
Total Bilirubin: 0.4 mg/dL (ref 0.2–1.2)
Total Protein: 6.8 g/dL (ref 6.0–8.3)

## 2024-02-18 LAB — CBC WITH DIFFERENTIAL/PLATELET
Basophils Absolute: 0 K/uL (ref 0.0–0.1)
Basophils Relative: 0.6 % (ref 0.0–3.0)
Eosinophils Absolute: 0.1 K/uL (ref 0.0–0.7)
Eosinophils Relative: 1.6 % (ref 0.0–5.0)
HCT: 37.3 % (ref 36.0–46.0)
Hemoglobin: 12.6 g/dL (ref 12.0–15.0)
Lymphocytes Relative: 42.2 % (ref 12.0–46.0)
Lymphs Abs: 1.7 K/uL (ref 0.7–4.0)
MCHC: 33.8 g/dL (ref 30.0–36.0)
MCV: 89.8 fl (ref 78.0–100.0)
Monocytes Absolute: 0.4 K/uL (ref 0.1–1.0)
Monocytes Relative: 10 % (ref 3.0–12.0)
Neutro Abs: 1.8 K/uL (ref 1.4–7.7)
Neutrophils Relative %: 45.6 % (ref 43.0–77.0)
Platelets: 219 K/uL (ref 150.0–400.0)
RBC: 4.16 Mil/uL (ref 3.87–5.11)
RDW: 13.5 % (ref 11.5–15.5)
WBC: 4 K/uL (ref 4.0–10.5)

## 2024-02-18 LAB — HEMOGLOBIN A1C: Hgb A1c MFr Bld: 7.7 % — ABNORMAL HIGH (ref 4.6–6.5)

## 2024-02-18 LAB — BASIC METABOLIC PANEL WITH GFR
BUN: 23 mg/dL (ref 6–23)
CO2: 27 meq/L (ref 19–32)
Calcium: 9.4 mg/dL (ref 8.4–10.5)
Chloride: 107 meq/L (ref 96–112)
Creatinine, Ser: 0.89 mg/dL (ref 0.40–1.20)
GFR: 67.01 mL/min
Glucose, Bld: 144 mg/dL — ABNORMAL HIGH (ref 70–99)
Potassium: 4.4 meq/L (ref 3.5–5.1)
Sodium: 140 meq/L (ref 135–145)

## 2024-02-18 LAB — LIPID PANEL
Cholesterol: 111 mg/dL (ref 28–200)
HDL: 56.2 mg/dL
LDL Cholesterol: 41 mg/dL (ref 10–99)
NonHDL: 54.82
Total CHOL/HDL Ratio: 2
Triglycerides: 71 mg/dL (ref 10.0–149.0)
VLDL: 14.2 mg/dL (ref 0.0–40.0)

## 2024-02-18 LAB — TSH: TSH: 1.45 u[IU]/mL (ref 0.35–5.50)

## 2024-02-18 LAB — MICROALBUMIN / CREATININE URINE RATIO
Creatinine,U: 215.4 mg/dL
Microalb Creat Ratio: 6.4 mg/g (ref 0.0–30.0)
Microalb, Ur: 1.4 mg/dL (ref 0.7–1.9)

## 2024-02-18 NOTE — Telephone Encounter (Signed)
 Copied from CRM #[id number]. Topic: General - Running Late >> Feb 18, 2024  9:18 AM Amber Decker wrote: Patient called said that she is running late due to traffic and should be there by 9:25AM

## 2024-02-18 NOTE — Telephone Encounter (Signed)
 Noted. Pt referring to lab visit. No further action is needed

## 2024-02-23 ENCOUNTER — Ambulatory Visit: Admitting: Internal Medicine

## 2024-02-23 ENCOUNTER — Encounter: Payer: Self-pay | Admitting: Internal Medicine

## 2024-02-23 ENCOUNTER — Encounter: Payer: Self-pay | Admitting: Pharmacist

## 2024-02-23 DIAGNOSIS — Z7985 Long-term (current) use of injectable non-insulin antidiabetic drugs: Secondary | ICD-10-CM

## 2024-02-23 DIAGNOSIS — Z7984 Long term (current) use of oral hypoglycemic drugs: Secondary | ICD-10-CM

## 2024-02-23 DIAGNOSIS — I10 Essential (primary) hypertension: Secondary | ICD-10-CM

## 2024-02-23 DIAGNOSIS — I471 Supraventricular tachycardia, unspecified: Secondary | ICD-10-CM

## 2024-02-23 DIAGNOSIS — G7000 Myasthenia gravis without (acute) exacerbation: Secondary | ICD-10-CM | POA: Diagnosis not present

## 2024-02-23 DIAGNOSIS — E7800 Pure hypercholesterolemia, unspecified: Secondary | ICD-10-CM

## 2024-02-23 DIAGNOSIS — E1165 Type 2 diabetes mellitus with hyperglycemia: Secondary | ICD-10-CM

## 2024-02-23 MED ORDER — LISINOPRIL-HYDROCHLOROTHIAZIDE 10-12.5 MG PO TABS: 1.0000 | ORAL_TABLET | Freq: Every day | ORAL | 1 refills | Status: AC

## 2024-02-23 MED ORDER — METOPROLOL TARTRATE 25 MG PO TABS: 25.0000 mg | ORAL_TABLET | Freq: Two times a day (BID) | ORAL | 1 refills | Status: AC

## 2024-02-23 MED ORDER — ROSUVASTATIN CALCIUM 5 MG PO TABS: 5.0000 mg | ORAL_TABLET | Freq: Every day | ORAL | 1 refills | Status: AC

## 2024-02-23 MED ORDER — METFORMIN HCL ER 500 MG PO TB24: 1500.0000 mg | ORAL_TABLET | Freq: Every day | ORAL | 1 refills | Status: AC

## 2024-02-23 NOTE — Progress Notes (Signed)
 Noted thank you

## 2024-02-23 NOTE — Progress Notes (Signed)
 Patient Assistance Program (PAP) Application   Manufacturer: Secretary/administrator    (New enrollment) Medication(s): Trulicity  Patient Portion of Application:  02/23/24: Patient to sign in clinic 02/23/24 (scheduled for PCP OV) Income Documentation: N/A - Electronic verification elected.  Provider Portion of Application:  02/23/24: Provider portion completed by Pharmacist and placed in PCP inbox for signature. Prescription(s): Included in PAP application. Trulicity dose left blank for PCP to fill out. Will depend if she is out of Ozempic  or still taking.   Next Steps: [x]    Patient to sign application at office visit today 02/23/24. Provided full app to CMA  [x]    Provider portion of application filled out and provided to CMA for PCP review/signature []    Upon PCP signature, Application to be returned to PharmD

## 2024-02-23 NOTE — Progress Notes (Signed)
 Chart Review Reason: Drug information Question - Medication Access  Summary: Receiving Ozempic  in 2025 through PAP, no longer available through patient assistance.  Current established on Ozempic  though unclear how much she has remaining (to discuss at today's office visit)   Currently enrolled in [employer] PPOI plan. Unfortunately, brand medications remain unaffordable through insurance for 2026.  The only other assistance available for GLP1 outside of insurance at this time would be Trulicity via patient assistance, [employer].    Considerations: Trulicity 4.5 mg weekly is reasonable if she is still taking Ozempic  1 mg weekly. Consider 3 mg if any prior issues with nausea on Ozempic  titrations.   If she has been out of Ozempic  for several week, can restart the dosing schedule for Trulicity from the beginning.  Manuelita FABIENE Kobs, PharmD, BCACP, CPP Clinical Pharmacist Practitioner [facility] at [facility] Ph: [phone]

## 2024-02-23 NOTE — Progress Notes (Signed)
 "  Subjective:    Patient ID: Amber Decker, female    DOB: [birth year], 69 y.o.   MRN: [id number]  Patient here for  Chief Complaint  Patient presents with   Medical Management of Chronic Issues    HPI Here for a scheduled follow up - follow up regarding diabetes and hypertension. Has been on ozempic  and metformin . Insurance no longer covering ozempic . Reviewed CGM - 02/10/24 - 02/23/24 - time active 98% - target range 79%, high 20% and very high 1%. Discussed diet and exercise. Overall sugars appear to be improved. Discussed changing to trulicity. No chest pain or sob reported. No abdominal pain or bowel change reported. Handling stress.    Past Medical History:  Diagnosis Date   Diabetes mellitus without complication (HCC) 2008   Herpes    Hypertension    Myasthenia gravis [facility])    onset age 40's?   SVT (supraventricular tachycardia)    Past Surgical History:  Procedure Laterality Date   BREAST BIOPSY Right 10/24/2022   right breast stereo, X marker, FIBROADENOMATOID CHANGE WITH ASSOCIATED COARSE CALCIFICATIONS.- FOCAL ADENOSIS - NEGATIVE FOR MALIGNANCY.   BREAST BIOPSY Right 10/24/2022   MM RT BREAST BX W LOC DEV 1ST LESION IMAGE BX SPEC STEREO GUIDE 10/24/2022 Correne Corean KIDD, MD [facility]   MYOMECTOMY     UTERINE FIBROID SURGERY     at [facility]   Family History  Problem Relation Age of Onset   Diabetes Mother    Alcohol abuse Father    Heart disease Father        myocardial infarction   Breast cancer Neg Hx    Colon cancer Neg Hx    Social History   Socioeconomic History   Marital status: Divorced    Spouse name: Not on file   Number of children: 0   Years of education: Not on file   Highest education level: Not on file  Occupational History    Employer: OTHER  Tobacco Use   Smoking status: Never   Smokeless tobacco: Never  Substance and Sexual Activity   Alcohol use: Yes    Alcohol/week: 0.0 standard drinks of alcohol    Comment: occasionally    Drug use: No   Sexual activity: Not on file  Other Topics Concern   Not on file  Social History Narrative   Not on file   Social Drivers of Health   Tobacco Use: Low Risk (02/23/2024)   Patient History    Smoking Tobacco Use: Never    Smokeless Tobacco Use: Never    Passive Exposure: Not on file  Financial Resource Strain: Low Risk (07/27/2023)   Overall Financial Resource Strain (CARDIA)    Difficulty of Paying Living Expenses: Not hard at all  Food Insecurity: No Food Insecurity (07/27/2023)   Epic    Worried About Programme Researcher, Broadcasting/film/video in the Last Year: Never true    Ran Out of Food in the Last Year: Never true  Transportation Needs: No Transportation Needs (07/27/2023)   Epic    Lack of Transportation (Medical): No    Lack of Transportation (Non-Medical): No  Physical Activity: Insufficiently Active (07/27/2023)   Exercise Vital Sign    Days of Exercise per Week: 3 days    Minutes of Exercise per Session: 30 min  Stress: No Stress Concern Present (07/27/2023)   [employer] of Occupational Health - Occupational Stress Questionnaire    Feeling of Stress: Not at all  Social Connections: Moderately Isolated (07/27/2023)  Social Connection and Isolation Panel    Frequency of Communication with Friends and Family: More than three times a week    Frequency of Social Gatherings with Friends and Family: More than three times a week    Attends Religious Services: More than 4 times per year    Active Member of Clubs or Organizations: No    Attends Banker Meetings: Never    Marital Status: Divorced  Depression (PHQ2-9): Low Risk (07/27/2023)   Depression (PHQ2-9)    PHQ-2 Score: 1  Alcohol Screen: Low Risk (07/27/2023)   Alcohol Screen    Last Alcohol Screening Score (AUDIT): 2  Housing: Unknown (07/27/2023)   Epic    Unable to Pay for Housing in the Last Year: No    Number of Times Moved in the Last Year: Not on file    Homeless in the Last Year: No  Utilities:  Not At Risk (07/27/2023)   Epic    Threatened with loss of utilities: No  Health Literacy: Adequate Health Literacy (07/27/2023)   B1300 Health Literacy    Frequency of need for help with medical instructions: Never     Review of Systems  Constitutional:  Negative for appetite change and unexpected weight change.  HENT:  Negative for congestion and sinus pressure.   Respiratory:  Negative for cough, chest tightness and shortness of breath.   Cardiovascular:  Negative for chest pain and palpitations.       No increased swelling.   Gastrointestinal:  Negative for abdominal pain, diarrhea, nausea and vomiting.  Genitourinary:  Negative for difficulty urinating and dysuria.  Musculoskeletal:  Negative for joint swelling and myalgias.  Skin:  Negative for color change and rash.  Neurological:  Negative for dizziness and headaches.  Psychiatric/Behavioral:  Negative for agitation and dysphoric mood.        Objective:     BP 122/70   Pulse 65   Temp 97.6 F (36.4 C) (Oral)   Ht 5' 9 (1.753 m)   Wt 259 lb (117.5 kg)   LMP 02/21/2012   SpO2 100%   BMI 38.25 kg/m  Wt Readings from Last 3 Encounters:  02/23/24 259 lb (117.5 kg)  10/15/23 259 lb (117.5 kg)  07/27/23 260 lb (117.9 kg)    Physical Exam Vitals reviewed.  Constitutional:      General: She is not in acute distress.    Appearance: Normal appearance.  HENT:     Head: Normocephalic and atraumatic.     Right Ear: External ear normal.     Left Ear: External ear normal.     Mouth/Throat:     Pharynx: No oropharyngeal exudate or posterior oropharyngeal erythema.  Eyes:     General: No scleral icterus.       Right eye: No discharge.        Left eye: No discharge.     Conjunctiva/sclera: Conjunctivae normal.  Neck:     Thyroid : No thyromegaly.  Cardiovascular:     Rate and Rhythm: Normal rate and regular rhythm.  Pulmonary:     Effort: No respiratory distress.     Breath sounds: Normal breath sounds. No  wheezing.  Abdominal:     General: Bowel sounds are normal.     Palpations: Abdomen is soft.     Tenderness: There is no abdominal tenderness.  Musculoskeletal:        General: No swelling or tenderness.     Cervical back: Neck supple. No tenderness.  Lymphadenopathy:  Cervical: No cervical adenopathy.  Skin:    Findings: No erythema or rash.  Neurological:     Mental Status: She is alert.  Psychiatric:        Mood and Affect: Mood normal.        Behavior: Behavior normal.         Outpatient Encounter Medications as of 02/23/2024  Medication Sig   acyclovir  (ZOVIRAX ) 400 MG tablet Take 1 tablet by mouth once daily   aspirin EC 81 MG tablet Take 81 mg by mouth daily.   Continuous Glucose Sensor (FREESTYLE LIBRE 3 PLUS SENSOR) MISC Change sensor every 15 days.   GNP MELATONIN PO Take 1 each by mouth daily as needed.   Magnesium 250 MG TABS Take 1 tablet by mouth daily.   Multiple Vitamin (MULTIVITAMIN) tablet Take 1 tablet by mouth daily.   Semaglutide , 1 MG/DOSE, 4 MG/3ML SOPN Inject 1 mg as directed once a week.   TURMERIC PO Take 1 each by mouth daily.   lisinopril -hydrochlorothiazide  (ZESTORETIC ) 10-12.5 MG tablet Take 1 tablet by mouth daily.   metFORMIN  (GLUCOPHAGE -XR) 500 MG 24 hr tablet Take 3 tablets (1,500 mg total) by mouth daily.   metoprolol  tartrate (LOPRESSOR ) 25 MG tablet Take 1 tablet (25 mg total) by mouth 2 (two) times daily.   rosuvastatin  (CRESTOR ) 5 MG tablet Take 1 tablet (5 mg total) by mouth daily.   [DISCONTINUED] lisinopril -hydrochlorothiazide  (ZESTORETIC ) 10-12.5 MG tablet Take 1 tablet by mouth daily.   [DISCONTINUED] metFORMIN  (GLUCOPHAGE -XR) 500 MG 24 hr tablet Take 3 tablets (1,500 mg total) by mouth daily.   [DISCONTINUED] metoprolol  tartrate (LOPRESSOR ) 25 MG tablet Take 1 tablet (25 mg total) by mouth 2 (two) times daily.   [DISCONTINUED] rosuvastatin  (CRESTOR ) 5 MG tablet Take 1 tablet (5 mg total) by mouth daily.   No  facility-administered encounter medications on file as of 02/23/2024.     Lab Results  Component Value Date   WBC 4.0 02/18/2024   HGB 12.6 02/18/2024   HCT 37.3 02/18/2024   PLT 219.0 02/18/2024   GLUCOSE 144 (H) 02/18/2024   CHOL 111 02/18/2024   TRIG 71.0 02/18/2024   HDL 56.20 02/18/2024   LDLCALC 41 02/18/2024   ALT 13 02/18/2024   AST 16 02/18/2024   NA 140 02/18/2024   K 4.4 02/18/2024   CL 107 02/18/2024   CREATININE 0.89 02/18/2024   BUN 23 02/18/2024   CO2 27 02/18/2024   TSH 1.45 02/18/2024   HGBA1C 7.7 (H) 02/18/2024   MICROALBUR 1.4 02/18/2024    MM 3D SCREENING MAMMOGRAM BILATERAL BREAST Result Date: 11/02/2023 CLINICAL DATA:  Screening. EXAM: DIGITAL SCREENING BILATERAL MAMMOGRAM WITH TOMOSYNTHESIS AND CAD TECHNIQUE: Bilateral screening digital craniocaudal and mediolateral oblique mammograms were obtained. Bilateral screening digital breast tomosynthesis was performed. The images were evaluated with computer-aided detection. COMPARISON:  Previous exam(s). ACR Breast Density Category b: There are scattered areas of fibroglandular density. FINDINGS: There are no findings suspicious for malignancy. IMPRESSION: No mammographic evidence of malignancy. A result letter of this screening mammogram will be mailed directly to the patient. RECOMMENDATION: Screening mammogram in one year. (Code:SM-B-01Y) BI-RADS CATEGORY  1: Negative. Electronically Signed   By: Corean Salter M.D.   On: 11/02/2023 13:59       Assessment & Plan:  SVT (supraventricular tachycardia) Assessment & Plan: Continue metoprolol .  Stable.    Essential hypertension, benign Assessment & Plan: Continue lisinopril /hctz and metoprolol .  Blood pressure has been doing well.  Follow pressures. Follow metabolic panel.  No change in medication.   Orders: -     Lisinopril -hydroCHLOROthiazide ; Take 1 tablet by mouth daily.  Dispense: 90 tablet; Refill: 1 -     Metoprolol  Tartrate; Take 1 tablet (25 mg  total) by mouth 2 (two) times daily.  Dispense: 180 tablet; Refill: 1  Type 2 diabetes mellitus with hyperglycemia, without long-term current use of insulin (HCC) Assessment & Plan: Remain on ozempic  and metformin  currently.  Discussed diet and exercise. Sugar as outlined.  Appears to be improving. Follow met b and A1c. Wants to continue to work on diet and exercise.  Will not be able to get ozempic  now. Application for trulicity. Hopefully can get assistance. Plan to change to trulicity. Follow met b and A1c.   Orders: -     metFORMIN  HCl ER; Take 3 tablets (1,500 mg total) by mouth daily.  Dispense: 270 tablet; Refill: 1 -     Microalbumin / creatinine urine ratio -     Hemoglobin A1c; Future -     Basic metabolic panel with GFR; Future  Hypercholesteremia Assessment & Plan: Continue crestor .  Low cholesterol diet and exercise.  Follow lipid panel.  Lab Results  Component Value Date   CHOL 111 02/18/2024   HDL 56.20 02/18/2024   LDLCALC 41 02/18/2024   TRIG 71.0 02/18/2024   CHOLHDL 2 02/18/2024     Orders: -     Rosuvastatin  Calcium ; Take 1 tablet (5 mg total) by mouth daily.  Dispense: 90 tablet; Refill: 1 -     Lipid panel; Future -     Hepatic function panel; Future  Myasthenia gravis [facility]) Assessment & Plan: Stable. No symptoms.       Allena Hamilton, MD "

## 2024-02-28 NOTE — Assessment & Plan Note (Signed)
 Stable. No symptoms.

## 2024-02-28 NOTE — Assessment & Plan Note (Signed)
 Continue lisinopril /hctz and metoprolol .  Blood pressure has been doing well.  Follow pressures. Follow metabolic panel. No change in medication.

## 2024-02-28 NOTE — Assessment & Plan Note (Signed)
 Continue crestor .  Low cholesterol diet and exercise.  Follow lipid panel.  Lab Results  Component Value Date   CHOL 111 02/18/2024   HDL 56.20 02/18/2024   LDLCALC 41 02/18/2024   TRIG 71.0 02/18/2024   CHOLHDL 2 02/18/2024

## 2024-02-28 NOTE — Assessment & Plan Note (Signed)
 Remain on ozempic  and metformin  currently.  Discussed diet and exercise. Sugar as outlined.  Appears to be improving. Follow met b and A1c. Wants to continue to work on diet and exercise.  Will not be able to get ozempic  now. Application for trulicity. Hopefully can get assistance. Plan to change to trulicity. Follow met b and A1c.

## 2024-02-28 NOTE — Assessment & Plan Note (Signed)
Continue metoprolol.  Stable.

## 2024-03-17 NOTE — Progress Notes (Signed)
 Amber Decker                                          MRN: [id number]   03/17/2024   The [facility] Quality Team Specialist reviewed this patient medical record for the purposes of chart review for care gap closure. The following were reviewed: chart review for care gap closure-kidney health evaluation for diabetes:eGFR  and uACR.    [facility] Quality Team

## 2024-04-15 ENCOUNTER — Ambulatory Visit: Admit: 2024-04-15 | Admitting: Gastroenterology

## 2024-05-19 ENCOUNTER — Other Ambulatory Visit

## 2024-05-24 ENCOUNTER — Ambulatory Visit: Admitting: Internal Medicine

## 2024-07-27 ENCOUNTER — Ambulatory Visit
